# Patient Record
Sex: Female | Born: 1990 | Race: White | Hispanic: No | Marital: Married | Smoking: Former smoker
Health system: Southern US, Community
[De-identification: ages and names within clinical notes are randomized; demographics above are authoritative.]

## PROBLEM LIST (undated history)

## (undated) ENCOUNTER — Emergency Department (HOSPITAL_COMMUNITY)

## (undated) DIAGNOSIS — E079 Disorder of thyroid, unspecified: Secondary | ICD-10-CM

## (undated) DIAGNOSIS — U071 COVID-19: Secondary | ICD-10-CM

## (undated) DIAGNOSIS — E0500 Thyrotoxicosis with diffuse goiter without thyrotoxic crisis or storm: Secondary | ICD-10-CM

## (undated) DIAGNOSIS — Z9289 Personal history of other medical treatment: Secondary | ICD-10-CM

## (undated) DIAGNOSIS — Z803 Family history of malignant neoplasm of breast: Secondary | ICD-10-CM

## (undated) DIAGNOSIS — E282 Polycystic ovarian syndrome: Secondary | ICD-10-CM

## (undated) DIAGNOSIS — J302 Other seasonal allergic rhinitis: Secondary | ICD-10-CM

## (undated) HISTORY — DX: Family history of malignant neoplasm of breast: Z80.3

## (undated) HISTORY — DX: Polycystic ovarian syndrome: E28.2

## (undated) HISTORY — DX: COVID-19: U07.1

## (undated) HISTORY — DX: Thyrotoxicosis with diffuse goiter without thyrotoxic crisis or storm: E05.00

## (undated) HISTORY — DX: Personal history of other medical treatment: Z92.89

## (undated) HISTORY — DX: Other seasonal allergic rhinitis: J30.2

---
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
## 2010-10-31 ENCOUNTER — Ambulatory Visit (INDEPENDENT_AMBULATORY_CARE_PROVIDER_SITE_OTHER): Payer: BC Managed Care – PPO

## 2010-10-31 ENCOUNTER — Encounter (INDEPENDENT_AMBULATORY_CARE_PROVIDER_SITE_OTHER): Payer: Self-pay

## 2010-10-31 VITALS — BP 123/84 | HR 106 | Temp 99.5°F | Resp 18 | Wt 146.8 lb

## 2010-10-31 DIAGNOSIS — R109 Unspecified abdominal pain: Secondary | ICD-10-CM

## 2010-10-31 NOTE — Patient Instructions (Addendum)
Orders Placed This Encounter   . XR KUB   . Urine Pregnancy   . Urine RACK without Microscopy   . RETURN TO WORK/SCHOOL    Recommend over the counter Miralax per instructions on bottle.  ________________________________________________________________________  Short Term Disability and Family Medical Leave Act  [facility] does NOT provide assistance with any disability applications.  If you feel your medical condition requires you to be on disability, you will need to follow up with  Your primary care physician or a specialist.  We apologize for any inconvenience.    For Medication Prescribed by [facility]:  As an Urgent Care facility, our clinic does NOT offer prescription refills over the telephone.    If you need more of the medication one of our medical providers prescribed, you will  Either need to be re-evaluated by [place] or see your primary care physician.    ________________________________________________________________________      It is very important that we have a phone number that is the single best way to contact you in the event that we become aware of important clinical information or concerns after your discharge.  If the phone number you provided at registration is NOT this number you should inform staff and registration prior to leaving.      Your treatment and evaluation today was focused on identifying and treating potentially emergent conditions based on your presenting signs, symptoms, and history.  The resulting initial clinical impression and treatment plan is not intended to be definitive or a substitute for a full physical examination and evaluation by your primary care provider.  If your symptoms persist, worsen, or you develop any new or concerning symptoms, you need to be evaluated.       If you received x-rays during your visit, be aware that the final and formal interpretation of those films by a radiologist may occur after your discharge.  If there is a significant discrepancy identified after your discharge, we will contact you at th telephone number provided at registration.      If you received a pelvic exam, you may have cultures pending for sexually transmitted diseases.  Positive cultures are reported to the [facility] Department of Health as required by state law.  You should be contacted if you cultures are positive.  We will not contact you if they are negative.  You may contact the Health Information Management Office of [facility] to get a copy of your results.  You did NOT receive a PAP smear (the screening test for cervical).  This specific test for women is best performed by your gynecologist or primary care provider when indicated.      If you are over 66 year old, we cannot discuss your personal health information with a parent, spouse, family member, or anyone else without your express consent.  This does not include those who have legitimate access to your records and information to assist in your care under the provisions of HIPAA [facility] Portability and Accountability Act) law, or those to whom you have previously given express written consent to do so, such a legal guardian or Power of [place].      You may have received medication that may cause you to feel drowsy and/or light headed for several hours.  You may even experience some amnesia of your stay.  You should avoid operating a motor vehicle or performing any activity requiring complete alertness or coordination until you feel fully awake (approximately 24-48  hours).  Avoid alcoholic beverages.  You may also have a dry mouth for several hours.  This is a normal side effect and will disappear as the effects of the medication wear off.       Instructions discussed with patient upon discharge by clinical staff with all questions answered.  Please call [facility] [phone]) if any further questions.  Go immediately to the emergency department if any concern or worsening symptoms.        Barrett Henle, PA-C 10/31/2010, 2:32 PM    Abdominal Pain (Nonspecific)  Your exam might not show the exact reason you have abdominal pain. Since there are many different causes of abdominal pain, another checkup and more tests may be needed. It is very important to follow up for lasting (persistent) or worsening symptoms. A possible cause of abdominal pain in any person who still has his or her appendix is acute appendicitis. Appendicitis is often hard to diagnose. Normal blood tests, urine tests, ultrasound, and CT scans do not completely rule out early appendicitis or other causes of abdominal pain. Sometimes, only the changes that happen over time will allow appendicitis and other causes of abdominal pain to be determined. Other potential problems that may require surgery may also take time to become more apparent. Because of this, it is important that you follow all of the instructions below.  HOME CARE INSTRUCTIONS   Rest as much as possible.    Do not eat solid food until your pain is gone.    While adults or children have pain: A diet of water, weak decaffeinated tea, broth or bouillon, gelatin, oral rehydration solutions (ORS), frozen ice pops, or ice chips may be helpful.    When pain is gone in adults or children: Start a light diet (dry toast, crackers, applesauce, or white rice). Increase the diet slowly as long as it does not bother you. Eat no dairy products (including cheese and eggs) and no spicy, fatty, fried, or high-fiber foods.    Use no alcohol, caffeine, or cigarettes.    Take your regular medicines unless your caregiver told you not to.    Take any prescribed medicine as directed.     Only take over-the-counter or prescription medicines for pain, discomfort, or fever as directed by your caregiver. Do not give aspirin to children.   If your caregiver has given you a follow-up appointment, it is very important to keep that appointment. Not keeping the appointment could result in a permanent injury and/or lasting (chronic) pain and/or disability. If there is any problem keeping the appointment, you must call to reschedule.    SEEK IMMEDIATE MEDICAL CARE IF:   Your pain is not gone in 24 hours.    Your pain becomes worse, changes location, or feels different.    You or your child has an oral temperature above 101.1, not controlled by medicine.    Your baby is older than 3 months with a rectal temperature of 102 F (38.9 C) or higher.    Your baby is 31 months old or younger with a rectal temperature of 100.4 F (38 C) or higher.    You have shaking chills.    You keep throwing up (vomiting) or cannot drink liquids.    There is blood in your vomit or you see blood in your bowel movements.    Your bowel movements become dark or black.    You have frequent bowel movements.    Your bowel movements stop (  become blocked) or you cannot pass gas.    You have bloody, frequent, or painful urination.    You have yellow discoloration in the skin or whites of the eyes.    Your stomach becomes bloated or bigger.    You have dizziness or fainting.    You have chest or back pain.   MAKE SURE YOU:   Understand these instructions.    Will watch your condition.    Will get help right away if you are not doing well or get worse.   Document Released: 05/25/2005 Document Re-Released: 08/19/2009  [facility] Patient Information 2011 [place], [place].

## 2010-10-31 NOTE — Progress Notes (Signed)
History of Present Illness: [name] is a 20 y.o. female who presents to the Urgent Care today with chief complaint of Abdominal Cramping .  Started today as pressure/pain around belly button. States worse earlier and a little better now that she is up moving around.  States aggravated by stretching abdomin.  Better with leaning forward. States last 3 days had fever, vomiting, myalgias, h/a.  States all those symptoms resolved yesterday.  States decreased appetite.  No dysuria.  States feels like she needs to pass gas but unable. States last normal BM was 3 days ago. Rate pain at rest 4/10 and pain with stretching or touch 9/10.  No treatment tried.        I reviewed and confirmed the patient's past medical history taken by the nurse or medical assistant with the addition of the following:    Past Medical History:    History reviewed.  No pertinent past medical history.  Past Surgical History:    History reviewed.  No pertinent past surgical history.  Allergies:  No Known Allergies  Medications:    No current outpatient prescriptions on file.       Social History:    History   Substance Use Topics   . Smoking status: Never Smoker    . Smokeless tobacco: Never Used   . Alcohol Use: No       Family History: No significant family history.  No family history on file.  Review of Systems:  General: no fever and no fatigue  Gastrointestinal: no nausea, no vomiting, no diarrhea and abdominal pain  Genitourinary: no dysuria, no frequency and no flank pain  Musculoskeletal: no myalgias  Neurologic: no headache  All other review of systems were negative    Physical Exam:  Vital signs:   Filed Vitals:    10/31/10 1339   BP: 123/84   Pulse: 106   Temp: 37.5 C (99.5 F)   TempSrc: Tympanic   Resp: 18   Weight: 66.6 kg (146 lb 13.2 oz)   SpO2: 98%       General: Well appearing, No acute distress and able to move around on exam table without difficulty  ENT: MMM   Pulmonary: clear to auscultation bilaterally, no wheezes, no rales and no rhonchi  Cardiovascular: regular rate/rhythm and normal S1/S2  Gastrointestinal: Non-distended, normal bowel sounds, soft, no rebound, no guarding and generalized TTP epigastric region, no peritoneal signs, negative heel tap.  Genito-urinary: no CVAT  Psychiatric: appropriate affect and behavior  Neurologic: alert and oriented x 3    Data Reviewed  {ADD POCT TEST:   Point-of-care testing:   Time collected: 1405  Urine HCG: Negative  Leukocytes: Negative  Nitrite: Negative  Urobilinogen: 2mg /dL  Protein: Negative  pH: 5.5  Blood: Negative  Specific Gravity: 1.005  Ketones: Negative  Bilirubin: Negative  Glucose: Negative    and Radiography:  KUB - per clinic read and radiology - non-obstructive bowel gas pattern, mild amount of stool retention and colonic loops    Course: Condition at discharge: Good     Differential Diagnosis: constipation, irritable bowel, gall stones  Assessment: 1. Abdominal pain (789.00AP)        Plan:  Orders Placed This Encounter   . XR KUB   . Urine Pregnancy   . Urine RACK without Microscopy   Patient is currently in NAD.  Recommend trial of Miralax.  Present to Emergency Department with any concerning or emergent symptoms for further workup and emergent care.  Plan was discussed and patient/parent/guardian verbalized understanding.  If symptoms are worsening or not improving the patient should return to the Urgent Care for further evaluation.  Supervising physician was physically present in Urgent Care and available for consultation and did not participate in the care of this patient.   Barrett Henle, PA-C 10/31/2010, 2:28 PM

## 2011-02-25 ENCOUNTER — Ambulatory Visit (HOSPITAL_BASED_OUTPATIENT_CLINIC_OR_DEPARTMENT_OTHER): Payer: 59 | Admitting: Infectious Disease

## 2011-02-25 ENCOUNTER — Other Ambulatory Visit (INDEPENDENT_AMBULATORY_CARE_PROVIDER_SITE_OTHER): Payer: Self-pay | Admitting: Internal Medicine

## 2011-02-25 ENCOUNTER — Ambulatory Visit (INDEPENDENT_AMBULATORY_CARE_PROVIDER_SITE_OTHER): Payer: 59 | Admitting: Rheumatology

## 2011-02-25 ENCOUNTER — Ambulatory Visit
Admission: RE | Admit: 2011-02-25 | Discharge: 2011-02-25 | Disposition: A | Discharge status: Home / Self Care | Payer: 59 | Source: Ambulatory Visit | Attending: Internal Medicine | Admitting: Internal Medicine

## 2011-02-25 VITALS — BP 136/98 | HR 78 | Temp 97.1°F | Ht 68.0 in | Wt 152.0 lb

## 2011-02-25 DIAGNOSIS — R1032 Left lower quadrant pain: Secondary | ICD-10-CM | POA: Insufficient documentation

## 2011-02-25 LAB — BASIC METABOLIC PANEL
ANION GAP: 8 mmol/L (ref 5–16)
BUN/CREAT RATIO: 14 (ref 6–22)
CALCIUM: 9.7 mg/dL (ref 8.5–10.4)
CARBON DIOXIDE: 27 mmol/L (ref 22–32)
CHLORIDE: 104 mmol/L (ref 96–111)
CREATININE: 0.76 mg/dL (ref 0.49–1.10)
GLUCOSE,NONFAST: 93 mg/dL (ref 65–139)
POTASSIUM: 4.2 mmol/L (ref 3.5–5.1)
SODIUM: 139 mmol/L (ref 136–145)

## 2011-02-25 LAB — CBC/DIFF
BASOPHILS: 1 % (ref 0–1)
BASOS ABS: 0.05 THOU/uL (ref 0.0–0.2)
EOS ABS: 0.517 10*3/uL — ABNORMAL HIGH (ref 0.1–0.3)
EOSINOPHIL: 6 % (ref 1–6)
HCT: 41.7 % (ref 33.5–45.2)
HGB: 14.7 g/dL (ref 11.5–15.2)
LYMPHOCYTES: 36 % (ref 20–45)
LYMPHS ABS: 2.98 THOU/uL (ref 1.0–4.8)
MCH: 30.5 pg (ref 27.4–33.0)
MCHC: 35.2 g/dL (ref 31.6–35.5)
MCV: 86.6 fL (ref 82.0–99.0)
MONOCYTES: 8 % (ref 4–13)
MONOS ABS: 0.637 THOU/uL (ref 0.1–0.9)
MPV: 7.7 FL (ref 7.4–10.4)
NRBC'S: 0 /100{WBCs}
PLATELET COUNT: 244 THOU/uL (ref 140–450)
PMN ABS: 4.16 THOU/uL (ref 1.5–7.7)
PMN'S: 49 % (ref 40–75)
RBC: 4.81 MIL/uL (ref 3.84–5.04)
RDW: 11.5 % (ref 10.2–14.0)
WBC: 8.4 THOU/uL (ref 3.5–11.0)

## 2011-02-25 LAB — HEPATIC FUNCTION PANEL
ALBUMIN: 4.6 g/dL (ref 3.5–4.8)
ALKALINE PHOSPHATASE: 65 U/L (ref 38–126)
ALT (SGPT): 21 U/L (ref 7–45)
AST (SGOT): 23 U/L (ref 8–41)
BILIRUBIN, TOTAL: 1 mg/dL (ref 0.3–1.3)
BILIRUBIN,CONJUGATED: <0.2 mg/dl (ref 0.0–0.3)
TOTAL PROTEIN: 7 g/dL (ref 6.4–8.3)

## 2011-02-25 NOTE — H&P (Addendum)
 [name]  1991-01-13  [id number]  Chief complaint: establish care  History of present illness: This is a 20 year old white female without any health problems who has presented with a complaint of left lower abdominal pain, sharp in nature, non radiating without any associated symptoms, lasting for 1-2 hours for the last 5 months. Associates the pain with constipation, and states that the pain improves with bowel movements. Also complains of cramps before having a bowel movement which get better with the BM. Denies any N/V/D. Denies melena/blood in BM. Denies fevers.  States that she last had the pain on 5/25 which was before her period, and the pain got better with the period. But states that is because her constipation gets better when she has her period. Usually has one BM once every 2-3 days.  States that she normally has her period once every 4 months, lasting for 5-7 days with normal flow and occasional cramping. Denies vaginal discharge, hx of ovarian cysts, hx of STDs. Is married and is monogamous. Uses condoms for contraception, states that she was on trinessa which caused her to be moody so she stopped taking it. LMP was on 02/09/11.      Constitutional: negative for fevers, sweats, fatigue, malaise, anorexia and weight loss  Eyes: negative for visual disturbance, irritation and redness  Ears, nose, mouth, throat, and face: negative for hearing loss, ear drainage, earaches, nasal congestion, sore throat, hoarseness and voice change  Respiratory: negative for cough, pleurisy/chest pain, wheezing or dyspnea on exertion  Cardiovascular: negative for chest pain, chest pressure/discomfort, irregular heart beats, paroxysmal nocturnal dyspnea, exertional chest pressure/discomfort and tachypnea  Gastrointestinal: negative for dysphagia, odynophagia, dyspepsia, reflux symptoms, nausea, vomiting, change in bowel habits, melena and diarrhea  Genitourinary:negative for frequency, dysuria, urinary  incontinence, hesitancy and hematuria  Integument/breast: negative for skin lesion(s)  Hematologic/lymphatic: negative for lymphadenopathy and petechiae  Musculoskeletal:negative for myalgias, stiff joints, muscle weakness and bone pain  Neurological: negative for headaches, dizziness, memory problems, speech problems, coordination problems, gait problems and weakness  Behavioral/Psych: negative for depression and tobacco use  Endocrine: negative for diabetic symptoms including polyuria, polydipsia and polyphagia  Past medical history: No pertinent past medical history.  Past surgical history: No pertinent past surgical history.  Current medications:   No outpatient prescriptions have been marked as taking for the 02/25/11 encounter (Office Visit) with Leni Somerset, MD.   Family history: No family history of cancers, IBS. Positive for HTN  Social history:   History     Social History   . Marital Status: Married     Spouse Name: N/A     Number of Children: N/A   . Years of Education: N/A     Occupational History   . Not on file.     Social History Main Topics   . Smoking status: Never Smoker    . Smokeless tobacco: Never Used   . Alcohol Use: No   . Drug Use: Not on file   . Sexually Active: Not on file     Other Topics Concern   . Not on file     Social History Narrative   . No narrative on file   Allergies: Review of patient's allergies indicates no known allergies.  Examination:  Vitals: BP 136/98  Pulse 78  Temp(Src) 36.2 C (97.1 F) (Tympanic)  Ht 1.727 m (5' 8)  Wt 68.947 kg (152 lb)  BMI 23.11 kg/m2  SpO2 97%  Neck: No JVD or thyromegaly or  lymphadenopathy  Lungs: clear to auscultation bilaterally.   Cardiovascular:    Heart regular rate and rhythm, S1, S2 normal, no murmur, click, rub or gallop  Abdomen: soft, non-tender, bowel sounds normal, non-distended and no hepatosplenomegaly  Extremities: no cyanosis or edema  Pelvic exam: exam chaperoned by nurse, normal vagina and vulva, normal cervix without  lesions, polyps or tenderness, nulliparous os, pap smear done today. No adnexal masses felt  Labs:   No results found for any previous visit.  Assessment and plan:   This is a 20 year old white female who has come in to establish care.  1. LLQ pain: likely related to constipation which seems to be due to IBS  - likely differential of an ovarian cyst explained to the patient, and alerted her in response to symptoms resembling ovarian torsion  - asked her to improve her diet, to maintain adequate water  intake, and to exercise regularly  - prescribed docusate and bisacodyl, along with metamucil  - asked her to return with worsening of symptoms  - ordered hepatic panel and CBC to screen for possible additional disorders    2. Contraception:  - prescribed orthohydrocyclien lo, which has a lower amount of estrogen than trinessa  - patient opted to try out OCPs again before referral to a gynaecologist for further discussion of contraceptive methods    3. Healthcare maintenance:  - states that she had a HPV vaccine last year from [facility] but will confirm  - PAP smear done in clinic today  - CBC, BMP ordered    RTC in clinic in 6 months, or earlier as needed.   Antonia Paterson, MD 02/25/2011, 10:56 PM          I discussed the patient's care in detail with Dr.Paris Hohn prior to the patient leaving the clinic. Any significant discussion points are noted.    Jeffrey L. Erline, MD   Professor of Internal Medicine  Section of General Internal Medicine   [place]  [facility]

## 2011-02-26 ENCOUNTER — Encounter: Payer: Self-pay | Admitting: Infectious Disease

## 2011-02-27 LAB — HISTORICAL CYTOPATHOLOGY-GYN (PAP AND HPV TESTS)

## 2011-03-09 ENCOUNTER — Telehealth (INDEPENDENT_AMBULATORY_CARE_PROVIDER_SITE_OTHER): Payer: Self-pay | Admitting: Infectious Disease

## 2011-03-09 NOTE — Telephone Encounter (Signed)
 Was unable to reach her via phone, left voice mail and informed her of the PAP smear results. Asked her to call me with questions.    Antonia Paterson, MD 03/09/2011, 10:42 AM

## 2011-06-24 ENCOUNTER — Ambulatory Visit (INDEPENDENT_AMBULATORY_CARE_PROVIDER_SITE_OTHER): Payer: 59 | Admitting: Dermatology

## 2011-08-26 ENCOUNTER — Ambulatory Visit (INDEPENDENT_AMBULATORY_CARE_PROVIDER_SITE_OTHER): Payer: 59 | Admitting: Infectious Disease

## 2011-10-14 ENCOUNTER — Encounter (INDEPENDENT_AMBULATORY_CARE_PROVIDER_SITE_OTHER): Payer: 59 | Admitting: Infectious Disease

## 2011-10-19 ENCOUNTER — Ambulatory Visit (INDEPENDENT_AMBULATORY_CARE_PROVIDER_SITE_OTHER): Payer: 59 | Admitting: Dermatology

## 2013-04-01 ENCOUNTER — Other Ambulatory Visit: Payer: Self-pay

## 2013-04-03 ENCOUNTER — Other Ambulatory Visit: Payer: Self-pay

## 2013-05-03 ENCOUNTER — Encounter (INDEPENDENT_AMBULATORY_CARE_PROVIDER_SITE_OTHER): Payer: Self-pay | Admitting: Internal Medicine

## 2013-05-05 ENCOUNTER — Ambulatory Visit (INDEPENDENT_AMBULATORY_CARE_PROVIDER_SITE_OTHER): Payer: 59 | Admitting: DERMATOLOGY

## 2013-05-18 ENCOUNTER — Ambulatory Visit: Payer: 59 | Attending: Internal Medicine | Admitting: GENERAL

## 2013-05-18 ENCOUNTER — Encounter (INDEPENDENT_AMBULATORY_CARE_PROVIDER_SITE_OTHER): Payer: Self-pay | Admitting: GENERAL

## 2013-05-18 VITALS — BP 130/84 | HR 84 | Temp 97.5°F | Ht 67.0 in | Wt 185.4 lb

## 2013-05-18 DIAGNOSIS — Z23 Encounter for immunization: Secondary | ICD-10-CM | POA: Insufficient documentation

## 2013-05-18 DIAGNOSIS — Z0000 Encounter for general adult medical examination without abnormal findings: Secondary | ICD-10-CM | POA: Insufficient documentation

## 2013-05-18 NOTE — Progress Notes (Signed)
Influenza injection 0.5 ml given per order. Pt tolerated well with no complaints. Clinic supplied. See immunization chart.   TDAP given per order. Pt tolerated well with no complaints. Clinic Supplied. See immunization record.    Katie Panning Antionette Crespin Forstrom, LPN 16/03/9603, 4:30 PM

## 2013-05-18 NOTE — H&P (Addendum)
 Medical Group Practice  Operated by [facility]  New Patient History and Physical     Date:   05/18/2013  Name: [name]  Age: 22 y.o.    Chief Complaint: Patient is here to establish care.    History of Present Illness   22 y.o. female with no significant past medical history who presents to clinic for routine health care maintenance. She was last seen here by Dr. Leni on 02/2011. During that visit she reported abd pain and irregular menstruation. Today she reports resolution of both of these symptoms: menstruating Q62month lasting 4-5 days. Currently menstruating without cramping. Denies abnormal vaginal discharge. ROS completely negative except for mild recurrent tension headaches relived with ibuprofen . Patient report some family stress for which she takes Valerian and feels like it's helping.     Past Medical History: None    Past Surgical/Procedural History   Past Surgical History   Procedure Laterality Date   . Hx wisdom teeth extraction  2010       Medications   1. Valerian herbal medicine   2. Multivitamin     Allergies   1. Cat: sneeze, nasal itching   2. NKDA     Family History  1. HTN: mother at age 42     Social History  1. Denies ever smoking   2. Denies illicit drug use including IV drugs   3. Drinks approximately 5 beverages alcoholic every 2-3 weeks    4. Lives [place], [place] with husband. Feels safe at home. Husband and friends serve as support system   5. Sexually active with husband & in manganous relationship   6. Has some social stress attributed to family dynamics (passive aggressive with each other & thus tries to avoid her family)    Review of Systems   Constitutional: (-) fever, chills, night sweat, weight loss, fatigue, general weakness, dizziness, sick contacts, recent illness   HEENT: (-) rhinorrhea, cough, sore throat, epistaxis, acute visual disturbance, acute hearing changes   Respiratory: (-) cough, hemoptysis, chest pain, dyspnea     Cardiovascular: (-) chest  pain, palpitation, syncope    Gastrointestinal: (-) abd pain, N/V, jaundice, diarrhea, constipation, blood/mucus/pus in stool   Genitourinary: (-) frequency, urgency, dysuria, hematuria     Hematologic/lymphatic: (-) easy bruising/bleeding, paleness    Endocrine: (-) polyuria, polydipsia, temperature intolerance   Musculoskeletal: (-) muscle weakness, myalgias   Neurological: (+) headaches (-) seizures, focal weakness, paresthesias, memory loss       Physical Examination   BP 130/84  Pulse 84  Temp(Src) 36.4 C (97.5 F) (Tympanic)  Ht 1.702 m (5' 7)  Wt 84.1 kg (185 lb 6.5 oz)  BMI 29.03 kg/m2  SpO2 97%  LMP 05/18/2013  General: well appearing, overweight, no distress, vital signs reviewed and noted for mildly elevated BMI    HEENT: Head NCAT, B/L scleral anicterus, no conjunctival injection, PERRLA, EOMI, non-bulging pearly gray TM with nl appearing landmarks, midline nasal septum, intact gross hearing acuity, midline septum, sinus not TTP (frontal, maxillary), no polyps noted, MMM, no oropharyngeal injection or exudates, no tonsillar enlargement, midline uvula    Neck: supple, FROM, non-palpable thyroid , no LAD, no carotid bruit   Resp: normal WOB, CTAB, no r/r/w    CV: RRR, nlS1S2, no m/r/g    GI: +BS, soft, ND, NT, no rebound or guarding, no CVA tenderness, no palpable mass or organomegaly including HSM   Ext: dry, warm, well perfused, no c/c/e, 2+ pulses (UE & LE), nl CR (<  2sec)    Neuro: A&Ox3, intact CN3-12, no focal defecits appreciated [nl sensation, strength 5/5 symmetric & B/L, +2/4 reflexes B/L in UE & LE, no UMN signs (Babinski, Clonus)]   Skin: intact, no erythema, no rash/lesions noted      Data reviewed: Vitals, medications, allergies, immunizations, past medical notes, previous labs and imaging studies     Assessment and Plan   [name] is a 22 y.o. female with no significant past medical history who presents to clinic for routine follow-up care.       Routine health  maintenance    Administered influenza & Tdap vaccines today     PAP & HPV testing to be done next week (patient currently menstruating)    Fasting lipid & BMP ordered       Orders Placed This Encounter   . Influenza Vaccine 3 Years through Adult   . TETANUS,DIPTHERIA,ACEL PERT, > OR EQUAL TO 23YRS OLD(ADACEL )(ADMIN)   . Lipid Panel   . Basic Metabolic Pnl, Fasting         Lyanne Cadet, MD   05/18/2013 4:25 PM   Internal Medicine, PGY1   [place]  [facility]        I saw and examined the patient.  I reviewed the resident's note.  I agree with the findings and plan of care as documented in the resident's note.  Any exceptions/additions are edited/noted.    Patrisha Pons, MD 05/18/2013, 10:39 PM

## 2013-05-26 ENCOUNTER — Ambulatory Visit (INDEPENDENT_AMBULATORY_CARE_PROVIDER_SITE_OTHER): Payer: 59 | Admitting: Rheumatology

## 2013-05-26 ENCOUNTER — Encounter (INDEPENDENT_AMBULATORY_CARE_PROVIDER_SITE_OTHER): Payer: Self-pay | Admitting: GENERAL

## 2013-05-26 ENCOUNTER — Ambulatory Visit
Admission: RE | Admit: 2013-05-26 | Discharge: 2013-05-26 | Disposition: A | Discharge status: Home / Self Care | Payer: 59 | Source: Ambulatory Visit | Attending: Interna Medicine | Admitting: Interna Medicine

## 2013-05-26 ENCOUNTER — Ambulatory Visit (HOSPITAL_BASED_OUTPATIENT_CLINIC_OR_DEPARTMENT_OTHER): Payer: 59 | Admitting: GENERAL

## 2013-05-26 ENCOUNTER — Other Ambulatory Visit (INDEPENDENT_AMBULATORY_CARE_PROVIDER_SITE_OTHER): Payer: Self-pay | Admitting: Interna Medicine

## 2013-05-26 VITALS — BP 126/84 | HR 106 | Temp 98.7°F | Ht 67.0 in | Wt 184.1 lb

## 2013-05-26 DIAGNOSIS — Z0000 Encounter for general adult medical examination without abnormal findings: Secondary | ICD-10-CM | POA: Insufficient documentation

## 2013-05-26 DIAGNOSIS — Z124 Encounter for screening for malignant neoplasm of cervix: Secondary | ICD-10-CM | POA: Insufficient documentation

## 2013-05-26 LAB — BASIC METABOLIC PANEL, FASTING
ANION GAP: 10 mmol/L (ref 4–13)
BUN/CREAT RATIO: 20 (ref 6–22)
BUN: 14 mg/dL (ref 8–25)
CALCIUM: 9.5 mg/dL (ref 8.5–10.4)
CARBON DIOXIDE: 24 mmol/L (ref 22–32)
CHLORIDE: 104 mmol/L (ref 96–111)
CREATININE: 0.71 mg/dL (ref 0.49–1.10)
ESTIMATED GLOMERULAR FILTRATION RATE: >59 ml/min/1.73m2 (ref >59)
GLUCOSE, FASTING: 96 mg/dL (ref 70–105)
POTASSIUM: 4 mmol/L (ref 3.5–5.1)
SODIUM: 138 mmol/L (ref 136–145)

## 2013-05-26 LAB — LIPID PANEL
CHOLESTEROL: 196 mg/dL (ref 100–200)
HDL-CHOLESTEROL: 57 mg/dL (ref >39)
LDL (CALCULATED): 125 mg/dL — ABNORMAL HIGH (ref <100)
NON - HDL (CALCULATED): 139 mg/dL (ref <190)
TRIGLYCERIDES: 69 mg/dL (ref <150)
VLDL (CALCULATED): 14 mg/dL (ref <30)

## 2013-05-26 NOTE — Progress Notes (Addendum)
Medical Group Practice  Operated by [facility]  Return Patient Note          Date:   05/26/2013  Name: [name]  Age: 22 y.o.    Chief Complaint: Return for PAP smear     History of Present Illness   22 y.o. female with no significant past medical history who presents to clinic for routine health care maintenance. She was last seen here by me on 05/18/13 for her annual physical and to establish care. During that visit she was menstruating & thus pelvic exam and PAP smear were not performed, for which she has returned today.She reports no abd/suprapubic pain, abnormal vaginal odor or discharge.     Medications:     No outpatient prescriptions prior to visit.  No facility-administered medications prior to visit.    Physical Examination   BP 126/84   Pulse 106   Temp(Src) 37.1 C (98.7 F) (Tympanic)   Ht 1.702 m (5\' 7" )   Wt 83.5 kg (184 lb 1.4 oz)   BMI 28.82 kg/m2   SpO2 92%   LMP 05/18/2013  General: well appearing, overweight, no distress, vital signs reviewed and to be WNL     HEENT: NCAT, no conjunctival injection, MMM   Neck: no LAD  Resp: CTAB    CV: RRR, nlS1S2, no m/r/g    GI: +BS, soft, ND, NT   Genito-urinary: normal labia, normal introitus without lesions, normal urethral meatus without lesions or hypermobility, normal vaginal mucosa without discharge and normal cervix without lesions or prolapse, no cervical motion tenderness, no mass noted on bimanual exam   Ext: dry, warm, well perfused, no c/c/e    Neuro: grossly normal    Skin: intact, no erythema, no rash/lesions noted      Data reviewed: Vitals, medications, allergies, immunizations, past medical notes, previous labs and imaging studies     Assessment and Plan   [name] is a 22 y.o. female with no significant past medical history who presents to clinic for Pelvic exam with PAP smear.        Routine health maintenance    Patient asymptomatic & pelvic exam unremarkable    PAP & HPV testing ordered     Fasting lipid noted for elevated LDL: will encourage lifestyle modification      BMP WNL      Orders Placed This Encounter    PAP PROCEDURE ONLY-MEDICARE PTS (AMB ONLY)    CYTOPATHOLOGY-GYN (PAP TESTS ONLY)       Darrel Reach, MD   05/26/2013 6:36 PM   Internal Medicine, PGY1   [facility]    I discussed the patient's care with the Resident prior to the patient leaving the clinic. Any significant discussion points are noted .    Doree Albee, MD  Chief Resident  Department of Internal Medicine

## 2013-06-05 LAB — HISTORICAL CYTOPATHOLOGY-GYN (PAP AND HPV TESTS)

## 2013-06-08 NOTE — L&D Delivery Note (Addendum)
[facility]    Delivery Summary Note      Name: [name]   MRN: [id number]  DOB:  September 08, 1990  Admitted: 04/20/2014  6:12 PM       Pre-delivery diagnosis:      23 y.o. G1P0 at 2573w2d gestation. Term pregnancy, RH negative, Prolonged labor  Post-delivery diagnosis:  Same + delivery of a viable infant with compound presentation    Findings:           Delivery of viable term female infant, 5# 11 oz. compound presentation of left hand. Cord clamped x2 and cut. Infant to maternal abdomen.  Cord blood collected.  Placenta delivered via maternal effort, Schultze mechanism. Repair of vaginal laceration under local anesthesia using 3.0 Vicryl. Good hemostasis noted. Mom and baby "[name]" stable in the LDR. See below for delivery details.     Dr. Sedalia Mutaox was present and participated in the entire delivery.  MSIII Pete PeltLinda Nguyen also present.  Disposition:  Infant admitted to floor (rooming in with mother).    "Delivery Information"           Hollenberg, Girl [[id number]]     Events of Labor    Rupture date:  04/20/14   Rupture time:  2100   Rupture type:  Spontaneous   Fluid color:  Clear, Bloody                    Labor Length Times    Labor Onset Date:  04/20/14 Labor Onset Time:  0000   Dilation Complete Date:  04/20/14 Dilation Complete Time:  2130          Delivery Anesthesia    Method:  Local                    Assisted Delivery Details    Forceps attempted?:  No         Vacuum extractor attempted?:  No                            Shoulder Dystocia Maneuvers    Shoulder dystocial present?:  No             Delivery Information - Maternal    Vaginal laceration:  Yes Repaired:  Yes   Vaginal delivery est. blood loss (mL):  350   Surgical or additional est. blood loss (mL):  0   Combined est. blood loss (mL):  350   Repair suture:  3-0 Vicryl   Number of repair packets:  1          Delivery Information - Newborn    Birth date/time:   04/20/14 2143   Sex:  Female   Delivery type:  Vaginal, Spontaneous  Delivery             Newborn Presentation    Presentation:  Compound          Cord Information    Vessels:  3 Vessels   Complications:  Wrapped   Cord around:  left lower extremity   Number of loops:  1   Cord blood disposition:  Lab, Cord Segment Sent   Gases sent?:  No   Stem cell collection (by MD):  No          Delivery Resuscitation    Method:  None             Newborn  Assessment - All Apgars  Living status:  Yes      Skin color:     Heart rate:     Reflex Irrit:     Muscle tone:     Resp. effort:     Total:      1 Min:     0    2    2    1    2    7     5  Min:     1    2    2    2    2    9     10  Min:      15 Min:      20 Min:              Apgars assigned by:  CORWIN RN          Newborn Measurements    Weight:  2580 g           Placenta Information    Date/time:  04/20/2014 2153   Removal:  Spontaneous   Appearance:  Intact, Tomasa BlaseSchultz    Disposition:  Discarded              Delivery Providers    Delivering Clinician:  Sharin MonsHILL, ESTA   Other personnel:   Name Role   SHERRARD, Gerilyn PilgrimABBY ELIZABETH Registered Nurse   Agnes LawrenceORWIN, MICHELLE L Baby Nurse   COX, CHRISTINA LYN    Zenda AlpersGUYEN, LINDA                                    [name], CNM 04/20/2014, 22:59

## 2013-07-03 ENCOUNTER — Ambulatory Visit: Payer: 59 | Attending: DERMATOLOGY | Admitting: DERMATOLOGY

## 2013-07-03 ENCOUNTER — Encounter (INDEPENDENT_AMBULATORY_CARE_PROVIDER_SITE_OTHER): Payer: Self-pay | Admitting: DERMATOLOGY

## 2013-07-03 VITALS — BP 114/70 | Ht 66.93 in | Wt 180.8 lb

## 2013-07-03 DIAGNOSIS — B079 Viral wart, unspecified: Secondary | ICD-10-CM | POA: Insufficient documentation

## 2013-07-03 NOTE — Progress Notes (Signed)
Subjective:       Patient ID: [name] is a 23 y.o. female     Chief Complaint:     Chief Complaint   Patient presents with    Skin Check     Check lesion on left earlobe.        HPI  Growth left ear lobe for 4 years.  Has gotten bigger.  Wart right 3rd finger for years.  No pain or bleeding.  No personal or family hx skin cancer.  Careful in the sun    No current outpatient prescriptions on file.       Review of Systems   Constitutional: Negative for fever and chills.   Cardiovascular: Negative for chest pain.   Skin: Negative for itching and rash.       Objective:   . BP 114/70   Ht 1.7 m (5' 6.93")   Wt 82 kg (180 lb 12.4 oz)   BMI 28.37 kg/m2    Physical Exam   Constitutional: She is oriented to person, place, and time. She appears well-developed and well-nourished. No distress.   HENT:   Head: Normocephalic and atraumatic.   Neurological: She is alert and oriented to person, place, and time.   Skin: Skin is warm and dry. She is not diaphoretic.        Psychiatric: She has a normal mood and affect.     General skin exam was performed including head, neck, anterior/posterior trunk, bilateral upper and revealed no areas of concern other than those documented.    Assessment & Plan:       ICD-9-CM   1. Wart 078.10               Plan:  Discussed freezing    She wants to treat at home herself    Will follow in 4 months     The patient was educated on the importance of avoiding excessive sun exposure and wearing sunscreen daily.  Advised patient to re-apply sunscreen every 2-3 hours.  Advised the patient to avoid going to the tanning beds.  Advised to check skin routinely for any changes, especially any new moles or changes in existing moles.  Miles Costainoxann L Tanvir Hipple, MD

## 2013-08-17 ENCOUNTER — Encounter (INDEPENDENT_AMBULATORY_CARE_PROVIDER_SITE_OTHER): Payer: Self-pay | Admitting: Internal Medicine

## 2013-08-17 ENCOUNTER — Ambulatory Visit (INDEPENDENT_AMBULATORY_CARE_PROVIDER_SITE_OTHER): Payer: 59 | Admitting: Rheumatology

## 2013-08-17 ENCOUNTER — Ambulatory Visit (INDEPENDENT_AMBULATORY_CARE_PROVIDER_SITE_OTHER): Payer: 59 | Admitting: Internal Medicine

## 2013-08-17 ENCOUNTER — Ambulatory Visit
Admission: RE | Admit: 2013-08-17 | Discharge: 2013-08-17 | Disposition: A | Discharge status: Home / Self Care | Payer: 59 | Source: Ambulatory Visit | Attending: Internal Medicine | Admitting: Internal Medicine

## 2013-08-17 DIAGNOSIS — Z331 Pregnant state, incidental: Secondary | ICD-10-CM | POA: Insufficient documentation

## 2013-08-17 DIAGNOSIS — Z3480 Encounter for supervision of other normal pregnancy, unspecified trimester: Secondary | ICD-10-CM

## 2013-08-17 LAB — URINALYSIS WITH MICROSCOPIC REFLEX IF INDICATED
BILIRUBIN: NEGATIVE
BLOOD: NEGATIVE
GLUCOSE: NEGATIVE mg/dL
LEUKOCYTES: NEGATIVE
NITRITE: NEGATIVE
PH URINE: 6 (ref 5.0–8.0)
PROTEIN: NEGATIVE mg/dL
UROBILINOGEN: NORMAL mg/dL

## 2013-08-17 LAB — URINALYSIS MACROSCOPIC WITH REFLEX TO MICROSCOPIC URINALYSIS (CULTURE NOT PERFORMED): SPECIFIC GRAVITY, URINE: 1.004 — ABNORMAL LOW (ref 1.005–1.030)

## 2013-08-17 LAB — CBC
HCT: 40.3 % (ref 33.5–45.2)
HGB: 13.6 g/dL (ref 11.2–15.2)
MCH: 29.7 pg (ref 27.4–33.0)
MCHC: 33.7 g/dL (ref 32.5–35.8)
MCV: 88.2 fL (ref 78–100)
MPV: 9 fL (ref 7.5–11.5)
PLATELET COUNT: 252 THOU/uL (ref 140–450)
RBC: 4.57 MIL/uL (ref 3.63–4.92)
RDW: 13.4 % (ref 12.0–15.0)
WBC: 9.4 10*3/uL (ref 3.5–11.0)

## 2013-08-17 LAB — URINALYSIS, MICROSCOPIC
RBC'S: 1 /HPF (ref 0–4)
WBC'S: 1 /HPF (ref 0–6)

## 2013-08-17 LAB — ABO/RH AND ANTIBODY SCREEN
ABO/RH(D): A NEG
ANTIBODY SCREEN: NEGATIVE

## 2013-08-17 LAB — HEPATITIS B CORE ANTIBODY: AB TO HEP. B CORE AG: NEGATIVE

## 2013-08-17 LAB — HCG, SERUM QUALITATIVE, PREGNANCY: PREGNANCY, SERUM QUALITATIVE: POSITIVE — AB

## 2013-08-17 LAB — HIV1/HIV2 SCREEN, COMBINED ANTIGEN AND ANTIBODY: HIV SCREEN, COMBINED ANTIGEN & ANTIBODY: NEGATIVE

## 2013-08-17 LAB — HCG, PLASMA OR SERUM QUANTITATIVE, PREGNANCY: HCG QUANTITATIVE/PREG: 2247 m[IU]/mL — ABNORMAL HIGH (ref <5)

## 2013-08-17 MED ORDER — PRENATAL VITS NO.19-IRON 22 MG,6 MG-FOLIC ACID 1 MG-DHA 200 MG CAPSULE
1.0000 | ORAL_CAPSULE | Freq: Every day | ORAL | Status: DC
Start: 2013-08-17 — End: 2023-12-08

## 2013-08-17 NOTE — Progress Notes (Signed)
08/17/13 1400   HCG   Urine HCG Positive   Rapid HCG Lot# [id number]   Expiration Date 01/06/14   Internal Control Valid yes   Initials RKM

## 2013-08-17 NOTE — Progress Notes (Addendum)
Medical Group Practice  Operated by [facility]  Return Patient Note     Date:   08/17/2013  Name: [name]  Age: 23 y.o.    Chief Complaint: Missed Menses    Subjective:  [name] is a 23 y.o. female who presents after having two positive urine pregnancy tests at home.  LMP 06/22/13.  Denies any nausea, vomiting, vaginal bleeding.      Current Outpatient Prescriptions   Medication Sig    PNV Combo #19-Iron-Fol Ac-DHA 22-6-1-200 mg Oral Capsule Take 1 Cap by mouth Once a day       Objective:  BP 132/78   Pulse 93   Temp(Src) 36.2 C (97.1 F) (Tympanic)   Ht 1.694 m (5' 6.69")   Wt 83.8 kg (184 lb 11.9 oz)   BMI 29.2 kg/m2   SpO2 98%  General:  Healthy appearing female in no acute distress.  Vital signs reviewed.    Eyes: Conjunctiva clear.  Pupils equal, round, reactive.  HENT:  Normocephalic, atraumatic.  Mucous membranes moist.    Lungs: Clear bilaterally.  No wheezes, rales, or rhonchi noted.    Cardiovascular:  Regular rate and rhythm.  No murmurs, rubs, or gallops noted.    Abdomen:  Soft, non-tender, non-distended.  Bowel sounds normal.  Extremities: No cyanosis or edema.      Neurologic:  Alert, oriented x 3  Psychiatric:  Behavior and affect normal.      Data reviewed:  POCT urine preg test positive.    Assessment and Plan  1. Pregnancy        Orders Placed This Encounter    URINE CULTURE,ROUTINE    Cbc    RUBELLA VIRUS IGG AB    VARICELLA-ZOSTER VIRUS (VZV) ANTIBODY, IGG AND IGM, SERUM    URINALYSIS WITH CULTURE REFLEX    URINALYSIS, MICROSCOPIC    Hepatitis B Core Antibody    Serum Syphilis Test    NEISSERIA GONORRHOEAE DNA BY PCR    CHLAMYDIA TRACHOMITIS DNA BY PCR (INHOUSE)    HIV1/HIV2 SCREEN, COMBINED ANTIGEN AND ANTIBODY    HCG, SERUM QUALITATIVE, PREGNANCY    Referral to Ob/Gyn    POCT URINE HCG    Abo/Rh And Antibody Screen    PNV Combo #19-Iron-Fol Ac-DHA 22-6-1-200 mg Oral Capsule     23 y.o. female who presents after positive home  pregnancy test.  POCT pregnancy test in clinic positive as well.    -Start Prenatal vitamins  -Referral to OB  -Prenatal labs ordered  -Advised to avoid tobacco, alcohol    Follow up:  RTC PRN or as scheduled with Dr. Lake BellsJacques.    Janit BernJustin T. Deron Poole, M.D.  08/17/2013 14:17  PGY-3  Department of Internal Medicine  [facility] [employer]  Pager # [phone]        I discussed the patient's care with the Resident prior to the patient leaving the clinic. Any significant discussion points are noted .    Crist FatSarah Sofka, MD 08/17/2013, 15:45

## 2013-08-18 LAB — NEISSERIA GONORRHOEAE DNA BY PCR: NEISSERIA GONORRHOEAE: NEGATIVE

## 2013-08-18 LAB — VARICELLA-ZOSTER VIRUS (VZV) ANTIBODY, IGG AND IGM, SERUM
VARICELLA IGG AB INDEX: 5 Index
VARICELLA-ZOSTER VIRUS (VZV) ANTIBODY, IGG, SERUM: POSITIVE
VARICELLA-ZOSTER VIRUS (VZV) ANTIBODY, IGM, SERUM: NEGATIVE

## 2013-08-18 LAB — URINE CULTURE,ROUTINE

## 2013-08-18 LAB — SERUM SYPHILIS TEST: RPR: NONREACTIVE

## 2013-08-18 LAB — RUBELLA VIRUS IGG AB: RUBELLA IGG: 42.5 IU/mL

## 2013-08-18 LAB — CHLAMYDIA TRACHOMITIS DNA BY PCR (INHOUSE): CHLAMYDIA TRACHOMATIS: NEGATIVE

## 2013-08-21 ENCOUNTER — Ambulatory Visit (INDEPENDENT_AMBULATORY_CARE_PROVIDER_SITE_OTHER): Payer: Self-pay | Admitting: Advanced Practice Midwife

## 2013-08-21 ENCOUNTER — Telehealth (INDEPENDENT_AMBULATORY_CARE_PROVIDER_SITE_OTHER): Payer: Self-pay | Admitting: GENERAL

## 2013-08-21 NOTE — Telephone Encounter (Signed)
Message copied by Zada FindersVICTOR, Beautifull Cisar on Mon Aug 21, 2013 11:14 AM  ------       Message from: Bernerd PhoLISTON, LISA       Created: Mon Aug 21, 2013 11:13 AM         >> Holly BodilySHIRLEY BAKER 08/21/2013 10:37 AM       Tona SensingLena Cerbone nob pt          The patient's husband called asking if his wife could be seen for her nob visit before April 17th.   He is requesting that his wife be seen before her first trimester ends.   The patient has [employer] insurance.   [facility] [phone].   Thank you  ------

## 2013-08-21 NOTE — Telephone Encounter (Signed)
Pharmacy is asking that they dispense the prenatal vitamin that they have in stock is this okay?  Katie GinsbergNathea J Vallory Oetken, MA  08/21/2013, 13:07

## 2013-08-21 NOTE — Telephone Encounter (Signed)
No sooner appt. Left message for husband to return call.  Bernerd PhoLisa Liston, psr

## 2013-08-22 ENCOUNTER — Ambulatory Visit (INDEPENDENT_AMBULATORY_CARE_PROVIDER_SITE_OTHER): Payer: Self-pay | Admitting: GENERAL

## 2013-08-22 ENCOUNTER — Encounter (INDEPENDENT_AMBULATORY_CARE_PROVIDER_SITE_OTHER): Payer: Self-pay | Admitting: GENERAL

## 2013-08-22 NOTE — Telephone Encounter (Signed)
Message copied by Rhys MartiniPOILLUCCI, Philamena Kramar N on Tue Aug 22, 2013 12:09 PM  ------       Message from: Marcille BlancoBERRY, EDWARD R       Created: Tue Aug 22, 2013 11:30 AM         >> Marcille BlancoDWARD R BERRY 08/22/2013 11:30 AM       Hunsucker, Yvonne KendallJustin Todd, MD              Pharmacy called stated that had some question about this medication and is wanting to switch to a generic kind please call to advise  ------

## 2013-08-22 NOTE — Telephone Encounter (Signed)
Question was regarding prenatal vitamin, and need to know if a generic would be ok.  Please advise and I will call the pharmacy back.  Simon RheinAndrea N Tyrihanna Wingert, RN  08/22/2013, 12:11

## 2013-08-23 NOTE — Telephone Encounter (Signed)
Patient called wanting advice on the brand/type of pre-natal vitamin she should buy.     Patient may choice any prenatal vitamin brand but should generally look for:   Folic acid 400-800 mcg   Calcium 250 mg  Iron 30 mg   Vitamin C 50 mg   Vitamin B6 2 mg   Vitamin D 400 Units   Copper 3 mg   Zinc 15 mg     Darrel ReachLina Rieley Hausman, MD   08/23/2013 14:41   Internal Medicine, PGY1   [facility]

## 2013-08-23 NOTE — Telephone Encounter (Signed)
Message copied by Darrel ReachJACQUES, Gracia Saggese on Wed Aug 23, 2013  2:35 PM  ------       Message from: April HoldingBARNHART, MARGIE       Created: Mon Aug 21, 2013  9:51 AM       Regarding: FW: question about pnv         >> NANCY CLINE 08/21/2013 09:47 AM       Dr Judeth HornHunsucker:     [facility] to discuss if you have a specific kind of pre-natal vit. You want the pt to have. Thank you   ------

## 2013-08-24 NOTE — Telephone Encounter (Signed)
Message copied by Darrel ReachJACQUES, Linda Biehn on Thu Aug 24, 2013  4:52 PM  ------       Message from: [name], [name]       Created: Tue Aug 22, 2013 10:15 AM       Regarding: FW: Visit Follow-Up Question       Contact: [phone]         [name],        Typically they don't get you in until you're like [redacted] weeks pregnant       Jess              ----- Message -----          From: [name]          Sent: 08/22/2013   9:24 AM            To: [facility]       Subject: Visit Follow-Up Question                                   Hello,       I recently found out I am about [redacted] weeks pregnant (yay!) and I came in and saw Dr. Judeth HornHunsucker for my confirmatory blood tests, but they cannot schedule me for my obgyn appointment until April 17th and I am worried because my first trimester will be almost over by then. I was wondering if there was anything you could do as my primary provider? Could you do the next appointment sooner than then or anything? I really want to do this first appointment way sooner than April 17th but they said there is nothing available. Please let me know if you could help me.       -[name]         ------

## 2013-08-24 NOTE — Telephone Encounter (Signed)
Any prenatal vitamin they want to give is fine. Thanks. ----- Message ----- From: Boykin NearingJustin Todd Hunsucker, MD  Sent: 08/22/2013 12:11 PM To: Rhys MartiniPoillucci, Nylen Creque N, RN     Called pharmacy and notified of message from provider above.  Simon RheinAndrea N Sanjeev Main, RN  08/24/2013, 11:26

## 2013-09-08 ENCOUNTER — Encounter (INDEPENDENT_AMBULATORY_CARE_PROVIDER_SITE_OTHER): Payer: Self-pay | Admitting: GENERAL

## 2013-09-20 ENCOUNTER — Other Ambulatory Visit (INDEPENDENT_AMBULATORY_CARE_PROVIDER_SITE_OTHER): Payer: Self-pay | Admitting: Maternal & Fetal Medicine

## 2013-09-22 ENCOUNTER — Ambulatory Visit
Admission: RE | Admit: 2013-09-22 | Discharge: 2013-09-22 | Disposition: A | Discharge status: Home / Self Care | Payer: 59 | Source: Ambulatory Visit | Attending: Advanced Practice Midwife | Admitting: Advanced Practice Midwife

## 2013-09-22 ENCOUNTER — Ambulatory Visit (HOSPITAL_BASED_OUTPATIENT_CLINIC_OR_DEPARTMENT_OTHER): Payer: 59

## 2013-09-22 ENCOUNTER — Encounter (INDEPENDENT_AMBULATORY_CARE_PROVIDER_SITE_OTHER): Payer: Self-pay | Admitting: Advanced Practice Midwife

## 2013-09-22 ENCOUNTER — Encounter (INDEPENDENT_AMBULATORY_CARE_PROVIDER_SITE_OTHER): Payer: 59 | Admitting: Advanced Practice Midwife

## 2013-09-22 ENCOUNTER — Ambulatory Visit (INDEPENDENT_AMBULATORY_CARE_PROVIDER_SITE_OTHER): Payer: 59 | Admitting: Advanced Practice Midwife

## 2013-09-22 DIAGNOSIS — Z3400 Encounter for supervision of normal first pregnancy, unspecified trimester: Secondary | ICD-10-CM

## 2013-09-22 DIAGNOSIS — Z36 Encounter for antenatal screening of mother: Secondary | ICD-10-CM

## 2013-09-22 LAB — DRUG SCREEN, WITH CONFIRMATION, URINE
BARBITURATE, URINE: NEGATIVE
BENZODIAZEPINE,URINE: NEGATIVE
BUPRENORPHINE, URINE QUALITATIVE: NEGATIVE
CANNABINOID (THC), URINE: NEGATIVE
METHADONE, URINE: NEGATIVE
OXYCODONE QUAL, URINE: NEGATIVE
RANDOM URINE CREATININE: 108 mg/dL (ref 20–200)

## 2013-09-22 MED ORDER — PRENATAL VITAMINS-IRON FUMARATE 27 MG IRON-FOLIC ACID 0.8 MG TABLET
1.0000 | ORAL_TABLET | Freq: Every day | ORAL | Status: DC
Start: 2013-09-22 — End: 2013-10-18

## 2013-09-22 NOTE — Progress Notes (Addendum)
Here with husband John for NOB visit.  He works here in Building services engineercytology, she works at [employer].  Requests Rx for PNVs -- done  Wants genetic testing -- will schedule appropriately    Planned pregnancy dated by early u/s per very unsure LMP.  G1 P0  Feeling tired and nauseated but no vomiting.  Questions regarding exercise, how many hours she can work, course of prenatal care.  All answered in full.    NOB History taken as charted below:  Past Medical History   Diagnosis Date   . Seasonal allergic reaction      occasional OTC meds      Past Surgical History   Procedure Laterality Date   . Hx wisdom teeth extraction  2010      Family History   Problem Relation Age of Onset   . Hypertension Mother    . Spont Ab Mother    . Eclampsia Sister      ?eclampsia -- BP issue, on bedrest   . Spont Ab Sister    . Spont Ab Sister         Other than ROS in the HPI, all other systems were negative.     Prenatal physical done as charted below:  OB Prenatal Exam: Filed by Tona Sensingerbone, Colsen Modi, CNM on 09/22/2013  5:48 PM    General Physical Exam    HEENT: Normal  Heart: Normal  Skin: Normal     Thyroid: Normal  Lungs: Normal  Extremities: Normal 1+/1+ DTRs, no clonus    Lymph Nodes: Normal  Breasts: Normal  Neurological: Normal     Abdomen: Normal Neg CVAT           Pelvic Exam    Vulva: Normal  Vagina: Normal     Cervix: Normal  Uterus: 11 Weeks     Adnexa: Normal  Rectum:   deferred    Spines: Average  Sacrum: Concave     Subpubic Arch: Normal  Diagonal Conjugate:   >12 cm    Pelvic Type: Gynecoid                  FHTs not attempted with Doppler per seen on u/s earlier this morning    Assessment:      IUP at 10 weeks 2 days       S=D      Considering Midwifery Care.     Desires First trimester screen.     Some blood work ordered and done by PCP -- reviewed, WNL    Plan:    NOB teaching done.      OB Warning s/s taught and how to call reviewed.     Pap not due     CT/GC negative 08/2013     Additional prenatal labs and CF testing to be done with  first trimester screen.    [employer] [facility] overview given with "goody bag" and handouts     RTO 4 weeks ROB, sooner prn.    CNM saw patient independently.  Physician available on-site for consultation, collaboration, or referral as per CNM certification and licensure.    Tona SensingLena Joanthony Hamza, CNM  Jola Babinskiharles Hochberg, MD  09/25/2013, 09:36

## 2013-09-23 LAB — URINE CULTURE: CULTURE OBSERVATION: 50000

## 2013-09-25 ENCOUNTER — Encounter (INDEPENDENT_AMBULATORY_CARE_PROVIDER_SITE_OTHER): Payer: 59 | Admitting: Obstetrics & Gynecology

## 2013-09-30 ENCOUNTER — Encounter (HOSPITAL_BASED_OUTPATIENT_CLINIC_OR_DEPARTMENT_OTHER): Payer: Self-pay | Admitting: Advanced Practice Midwife

## 2013-10-05 ENCOUNTER — Other Ambulatory Visit (INDEPENDENT_AMBULATORY_CARE_PROVIDER_SITE_OTHER): Payer: 59

## 2013-10-09 ENCOUNTER — Encounter (INDEPENDENT_AMBULATORY_CARE_PROVIDER_SITE_OTHER): Payer: Self-pay

## 2013-10-09 ENCOUNTER — Ambulatory Visit
Admission: RE | Admit: 2013-10-09 | Discharge: 2013-10-09 | Disposition: A | Discharge status: Home / Self Care | Payer: 59 | Source: Ambulatory Visit | Attending: Advanced Practice Midwife | Admitting: Advanced Practice Midwife

## 2013-10-09 ENCOUNTER — Ambulatory Visit (INDEPENDENT_AMBULATORY_CARE_PROVIDER_SITE_OTHER): Payer: Self-pay | Admitting: Advanced Practice Midwife

## 2013-10-09 ENCOUNTER — Telehealth (INDEPENDENT_AMBULATORY_CARE_PROVIDER_SITE_OTHER): Payer: Self-pay | Admitting: Advanced Practice Midwife

## 2013-10-09 ENCOUNTER — Encounter (INDEPENDENT_AMBULATORY_CARE_PROVIDER_SITE_OTHER): Payer: Self-pay | Admitting: Advanced Practice Midwife

## 2013-10-09 DIAGNOSIS — Z331 Pregnant state, incidental: Secondary | ICD-10-CM | POA: Insufficient documentation

## 2013-10-09 DIAGNOSIS — Z6791 Unspecified blood type, Rh negative: Principal | ICD-10-CM

## 2013-10-09 DIAGNOSIS — O26899 Other specified pregnancy related conditions, unspecified trimester: Secondary | ICD-10-CM

## 2013-10-09 LAB — HEPATITIS B SURFACE ANTIGEN: HEPATITIS B SURFACE AG: NEGATIVE

## 2013-10-09 MED ORDER — RHO(D) IMMUNE GLOBULIN 1,500 UNIT (300 MCG) INTRAMUSCULAR SYRINGE
300.0000 ug | INJECTION | Freq: Once | INTRAMUSCULAR | Status: AC
Start: 2013-10-09 — End: 2013-10-09

## 2013-10-09 NOTE — Telephone Encounter (Signed)
Pt presented to clinic stating that she fell.  She fell on her knee & did not hit her abdomen or her back.  She has no c/o pain, bleeding or leakage of fluid.  Pt does not want to go to the ED because of co-pay.  Informed that she can go to Urgent Care but I don't know if they have a fetal doppler to listen to fhr.  Pt is Aneg & discussed with Betsy.  She would like for the pt to have rhogam.  Called & left message for the pt to return call to notify of need for rhogam injection.

## 2013-10-09 NOTE — Telephone Encounter (Signed)
Contacted pt & she is agreeable to come tomorrow for labs & rhogam.

## 2013-10-09 NOTE — Telephone Encounter (Signed)
Please give rhogam 300 mcg IM per Kristine RoyalBetsy Miller, CNM

## 2013-10-10 ENCOUNTER — Ambulatory Visit (HOSPITAL_BASED_OUTPATIENT_CLINIC_OR_DEPARTMENT_OTHER): Payer: 59

## 2013-10-10 ENCOUNTER — Ambulatory Visit
Admission: RE | Admit: 2013-10-10 | Discharge: 2013-10-10 | Disposition: A | Discharge status: Home / Self Care | Payer: 59 | Source: Ambulatory Visit | Attending: Advanced Practice Midwife | Admitting: Advanced Practice Midwife

## 2013-10-10 DIAGNOSIS — O26899 Other specified pregnancy related conditions, unspecified trimester: Secondary | ICD-10-CM

## 2013-10-10 DIAGNOSIS — O360990 Maternal care for other rhesus isoimmunization, unspecified trimester, not applicable or unspecified: Secondary | ICD-10-CM | POA: Insufficient documentation

## 2013-10-10 DIAGNOSIS — Z6791 Unspecified blood type, Rh negative: Secondary | ICD-10-CM

## 2013-10-10 DIAGNOSIS — Z3400 Encounter for supervision of normal first pregnancy, unspecified trimester: Secondary | ICD-10-CM

## 2013-10-10 LAB — TEST FOR FETAL CELLS: FETALDEX (KLEIHAUER BETKE STAIN): NONE SEEN

## 2013-10-10 LAB — ANTIBODY SCREEN: ANTIBODY SCREEN: NEGATIVE

## 2013-10-10 NOTE — Progress Notes (Signed)
Administered RhoGAM into PTs left deltoid.

## 2013-10-11 ENCOUNTER — Encounter (INDEPENDENT_AMBULATORY_CARE_PROVIDER_SITE_OTHER): Payer: Self-pay | Admitting: Advanced Practice Midwife

## 2013-10-18 ENCOUNTER — Ambulatory Visit: Payer: 59 | Attending: Advanced Practice Midwife | Admitting: Advanced Practice Midwife

## 2013-10-18 DIAGNOSIS — O26899 Other specified pregnancy related conditions, unspecified trimester: Secondary | ICD-10-CM

## 2013-10-18 DIAGNOSIS — O360990 Maternal care for other rhesus isoimmunization, unspecified trimester, not applicable or unspecified: Secondary | ICD-10-CM | POA: Insufficient documentation

## 2013-10-18 DIAGNOSIS — Z6791 Unspecified blood type, Rh negative: Secondary | ICD-10-CM

## 2013-10-18 DIAGNOSIS — Z3400 Encounter for supervision of normal first pregnancy, unspecified trimester: Secondary | ICD-10-CM | POA: Insufficient documentation

## 2013-10-28 DIAGNOSIS — O26899 Other specified pregnancy related conditions, unspecified trimester: Secondary | ICD-10-CM | POA: Insufficient documentation

## 2013-10-28 DIAGNOSIS — Z6791 Unspecified blood type, Rh negative: Secondary | ICD-10-CM | POA: Insufficient documentation

## 2013-10-28 NOTE — Progress Notes (Addendum)
Chief Complaint   Patient presents with    Routine Prenatal Visit      Pt presents for routine ROB visit   No c/o   Feeling better than last month   One episode of spotting on 5/5; short lived; small amount; did come in for RHogam   Excited to hear FHTs today     Anatomy scan ordered       Obstetric History    G1   P0   T0   P0   A0   TAB0   SAB0   E0   M0   L0        Gestational age: [redacted]w[redacted]d    Weight: 82.6 kg (182 lb 1.6 oz)  BP (Non-Invasive): 88/60 mmHg  # of fetuses: 1  FHR (1): 150's     Urine negative for all      ICD-9-CM    1. Pregnant V22.2 POCT URINE DIPSTICK     US FETAL ASSESSMENT   2. Supervision of normal first pregnancy V22.0 US FETAL ASSESSMENT       Disposition: Return in about 4 weeks (around 11/15/2013) for rob and fetal US at [facility].    Seen independently with collaborating physician present in the clinic  Kristine Royal, CNM 10/28/2013, 13:21   Jola Babinski, MD  10/31/2013, 09:30

## 2013-11-07 ENCOUNTER — Encounter (INDEPENDENT_AMBULATORY_CARE_PROVIDER_SITE_OTHER): Payer: 59 | Admitting: DERMATOLOGY

## 2013-11-15 ENCOUNTER — Ambulatory Visit (HOSPITAL_BASED_OUTPATIENT_CLINIC_OR_DEPARTMENT_OTHER): Payer: 59 | Admitting: Advanced Practice Midwife

## 2013-11-15 ENCOUNTER — Ambulatory Visit
Admission: RE | Admit: 2013-11-15 | Discharge: 2013-11-15 | Disposition: A | Discharge status: Home / Self Care | Payer: 59 | Source: Ambulatory Visit | Attending: Advanced Practice Midwife | Admitting: Advanced Practice Midwife

## 2013-11-15 VITALS — BP 110/68 | Wt 185.6 lb

## 2013-11-15 DIAGNOSIS — Z3400 Encounter for supervision of normal first pregnancy, unspecified trimester: Secondary | ICD-10-CM

## 2013-11-15 DIAGNOSIS — Z363 Encounter for antenatal screening for malformations: Secondary | ICD-10-CM | POA: Insufficient documentation

## 2013-11-15 DIAGNOSIS — Z1389 Encounter for screening for other disorder: Secondary | ICD-10-CM | POA: Insufficient documentation

## 2013-11-15 DIAGNOSIS — O360990 Maternal care for other rhesus isoimmunization, unspecified trimester, not applicable or unspecified: Secondary | ICD-10-CM | POA: Insufficient documentation

## 2013-11-15 DIAGNOSIS — Z36 Encounter for antenatal screening of mother: Secondary | ICD-10-CM

## 2013-11-15 NOTE — Progress Notes (Addendum)
Chief Complaint   Patient presents with    Routine Prenatal Visit      Pt presents for routine ROB visit   Feeling better, though still some nausea and food aversion     Anatomy scan today; WNL; girl   Offered AFP only: declined     Trouble with leg/foot pain after long day of work at [employer]; advised compression     stockings and supportive shoes     Obstetric History    G1   P0   T0   P0   A0   TAB0   SAB0   E0   M0   L0        Gestational age: [redacted]w[redacted]d    Weight: 84.2 kg (185 lb 10 oz)  BP (Non-Invasive): 110/68 mmHg  # of fetuses: 1  Fundal height: [place]  Preterm Labor: None  Fetal Movement: Present  Presentation: Unable To Assess  Edema: Trace  FHR (1): 130's  Comments: [place] today     Urine negative for all      ICD-9-CM    1. Supervision of normal first pregnancy V22.0 POCT URINE DIPSTICK    Routine labs:    Rh positive      Rubella immune   First trimester screen:  Declined   Flu vaccine:    December 1014   Anatomy scan:   WNL   28 week labs:     Pertussis vaccine:    GBS culture:      2.   Rh negative   Received Rhogam 10/10/13 due to spotting           Disposition: Return in about 4 weeks (around 12/13/2013) for rob.    Seen independently with collaborating physician present in the clinic  Kristine Royal, CNM 11/15/2013, 23:40       Heron Nay, MD  11/17/2013, 09:25

## 2013-12-19 ENCOUNTER — Encounter (HOSPITAL_BASED_OUTPATIENT_CLINIC_OR_DEPARTMENT_OTHER): Payer: Self-pay | Admitting: Advanced Practice Midwife

## 2013-12-19 ENCOUNTER — Ambulatory Visit: Payer: 59 | Attending: Advanced Practice Midwife | Admitting: Advanced Practice Midwife

## 2013-12-19 VITALS — BP 118/66 | Ht 66.0 in | Wt 193.1 lb

## 2013-12-19 DIAGNOSIS — Z3400 Encounter for supervision of normal first pregnancy, unspecified trimester: Secondary | ICD-10-CM | POA: Insufficient documentation

## 2013-12-19 NOTE — Progress Notes (Addendum)
Patient seen by [name] CNM  Here for ROB with [name]  Had some spotting after sex yesterday, was worried, but resolved with time.  It's a girl -- name is a secret   Weight: 87.6 kg (193 lb 2 oz)  BP (Non-Invasive): 118/66 mmHg  Protein: Negative  Glucose: Negative  Ketones: Negative  Preterm Labor: Weeks Gestation (Enter Comment)  Fetal Movement: Present  Edema: Negative    A:  IUP at 3361w6d      S=d    P:  OB Warning s/s including SROM, vaginal bleeding, decreased fetal movement, and contractions reviewed.       RTO 4 weeks, sooner prn.    CNM saw patient independently.  Physician available on-site for consultation, collaboration, or referral as per CNM certification and licensure.    [name], CNM

## 2014-01-16 ENCOUNTER — Ambulatory Visit
Admission: RE | Admit: 2014-01-16 | Discharge: 2014-01-16 | Disposition: A | Discharge status: Home / Self Care | Payer: 59 | Source: Ambulatory Visit | Attending: Advanced Practice Midwife | Admitting: Advanced Practice Midwife

## 2014-01-16 ENCOUNTER — Other Ambulatory Visit (HOSPITAL_BASED_OUTPATIENT_CLINIC_OR_DEPARTMENT_OTHER): Payer: Self-pay | Admitting: Advanced Practice Midwife

## 2014-01-16 ENCOUNTER — Ambulatory Visit (HOSPITAL_BASED_OUTPATIENT_CLINIC_OR_DEPARTMENT_OTHER): Payer: 59 | Admitting: Advanced Practice Midwife

## 2014-01-16 VITALS — BP 108/64 | Wt 195.3 lb

## 2014-01-16 DIAGNOSIS — Z3490 Encounter for supervision of normal pregnancy, unspecified, unspecified trimester: Secondary | ICD-10-CM

## 2014-01-16 DIAGNOSIS — Z3400 Encounter for supervision of normal first pregnancy, unspecified trimester: Secondary | ICD-10-CM | POA: Insufficient documentation

## 2014-01-16 DIAGNOSIS — Z3402 Encounter for supervision of normal first pregnancy, second trimester: Secondary | ICD-10-CM

## 2014-01-16 LAB — 28 WEEK OB PROFILE
BASOPHILS: 0 %
BASOS ABS: 0.023 10*3/uL (ref 0.000–0.200)
EOS ABS: 0.317 THOU/uL (ref 0.000–0.500)
EOSINOPHIL: 3 %
HCT: 34.7 % (ref 33.5–45.2)
HGB: 12 g/dL (ref 11.2–15.2)
LYMPHOCYTES: 17 %
LYMPHS ABS: 2.123 THOU/uL (ref 1.000–4.800)
MCH: 31.6 pg (ref 27.4–33.0)
MCHC: 34.6 g/dL (ref 32.5–35.8)
MCV: 91.2 fL (ref 78–100)
MONOCYTES: 6 %
MONOS ABS: 0.8 10*3/uL (ref 0.300–1.000)
MPV: 9.1 fL (ref 7.5–11.5)
ONE HOUR GLUCOLA: 116 mg/dl (ref 70–134)
PLATELET COUNT: 199 10*3/uL (ref 140–450)
PMN ABS: 9.348 THOU/uL — ABNORMAL HIGH (ref 1.500–7.700)
PMN'S: 74 %
RBC: 3.81 MIL/uL (ref 3.63–4.92)
RDW: 12.9 % (ref 12.0–15.0)
WBC: 12.6 THOU/uL — ABNORMAL HIGH (ref 3.5–11.0)

## 2014-01-16 LAB — ANTIBODY SCREEN: ANTIBODY SCREEN: NEGATIVE

## 2014-01-16 NOTE — Progress Notes (Addendum)
Chief Complaint   Patient presents with    Prenatal Check      Pt presents for routine ROB visit   No c/o   Did 28 week labs today   Will hold Rhogam for next visit since only 26 6/7  today   Also agreeable to receiving pertussis vaccine next time    Obstetric History    G1   P0   T0   P0   A0   TAB0   SAB0   E0   M0   L0        Gestational age: 8589w6d    Weight: 88.6 kg (195 lb 5.2 oz)  BP (Non-Invasive): 108/64 mmHg  # of fetuses: 1  Fundal height: 28  Preterm Labor: None  Fetal Movement: Present  Presentation: Transverse  Edema: Negative  FHR (1): 130's  Comments: rhogam next visit    1.   Supervision of normal first pregnancy   Routine labs:    Rh positive      Rubella immune   First trimester screen:  Declined   Flu vaccine:    December 1014   Anatomy scan:   WNL   28 week labs:  Done today   Pertussis vaccine:    GBS culture:      2.   Rh negative   Received Rhogam in May after a fall   Third trimester prophylaxis: next visit        Disposition: Return in about 2 weeks (around 01/30/2014) for rob x 4, then weekly.    Seen independently with collaborating physician present in the clinic  Kristine RoyalBetsy Miller, CNM 01/16/2014, 23:14

## 2014-01-30 ENCOUNTER — Ambulatory Visit: Payer: 59 | Attending: Advanced Practice Midwife | Admitting: Advanced Practice Midwife

## 2014-01-30 VITALS — BP 124/72 | Ht 66.0 in | Wt 201.3 lb

## 2014-01-30 DIAGNOSIS — O360990 Maternal care for other rhesus isoimmunization, unspecified trimester, not applicable or unspecified: Secondary | ICD-10-CM | POA: Insufficient documentation

## 2014-01-30 DIAGNOSIS — Z23 Encounter for immunization: Secondary | ICD-10-CM | POA: Insufficient documentation

## 2014-01-30 DIAGNOSIS — Z3400 Encounter for supervision of normal first pregnancy, unspecified trimester: Secondary | ICD-10-CM | POA: Insufficient documentation

## 2014-01-30 DIAGNOSIS — Z3402 Encounter for supervision of normal first pregnancy, second trimester: Secondary | ICD-10-CM

## 2014-01-30 NOTE — Progress Notes (Addendum)
Patient seen by Tona Sensing CNM  Doing well  Questions regarding breast pump -- reassured  Alternatives to formal CB class discussed  Weight: 91.3 kg (201 lb 4.5 oz)  BP (Non-Invasive): 124/72 mmHg  Protein: Negative  Glucose: Negative  Ketones: Negative    A:  IUP at [redacted]w[redacted]d       S=d    P:  OB warning signs including general s/s miscarriage, vaginal bleeding, abdominal cramping, timing of fetal movement, and unusual vaginal discharge discussed and reviewed.       RTO 2 weeks, sooner prn, CNM rotation.    CNM saw patient independently.  Physician available on-site for consultation, collaboration, or referral as per CNM certification and licensure.    Tona Sensing, CNM

## 2014-01-30 NOTE — Progress Notes (Signed)
Per Tona Sensing Rhogam 300 hg given IM right gluteus and Boostrix given IM right deltoid both tolerated well. Lang Snow, LPN  5/78/4696, 11:37

## 2014-02-01 ENCOUNTER — Ambulatory Visit (HOSPITAL_BASED_OUTPATIENT_CLINIC_OR_DEPARTMENT_OTHER): Payer: 59 | Admitting: Obstetrics & Gynecology

## 2014-02-01 ENCOUNTER — Encounter (HOSPITAL_COMMUNITY): Payer: Self-pay

## 2014-02-01 ENCOUNTER — Ambulatory Visit
Admission: RE | Admit: 2014-02-01 | Discharge: 2014-02-01 | Disposition: A | Discharge status: Home / Self Care | Payer: 59 | Attending: Obstetrics & Gynecology | Admitting: Obstetrics & Gynecology

## 2014-02-01 DIAGNOSIS — O368190 Decreased fetal movements, unspecified trimester, not applicable or unspecified: Secondary | ICD-10-CM

## 2014-02-01 DIAGNOSIS — O99891 Other specified diseases and conditions complicating pregnancy: Secondary | ICD-10-CM | POA: Insufficient documentation

## 2014-02-01 DIAGNOSIS — O360990 Maternal care for other rhesus isoimmunization, unspecified trimester, not applicable or unspecified: Secondary | ICD-10-CM | POA: Insufficient documentation

## 2014-02-01 DIAGNOSIS — O9989 Other specified diseases and conditions complicating pregnancy, childbirth and the puerperium: Secondary | ICD-10-CM | POA: Insufficient documentation

## 2014-02-01 DIAGNOSIS — J309 Allergic rhinitis, unspecified: Secondary | ICD-10-CM | POA: Insufficient documentation

## 2014-02-01 NOTE — H&P (Addendum)
[place] Department of Obstetric & Gynecology      HISTORY AND PHYSICAL     PATIENT: [name]  CHART NUMBER: [id number]  DATE OF SERVICE: 02/01/2014      PRIMARY OB: CNM [place]        CC: decreased fetal movement    HPI: [name] is a 23 y.o. G1P0  At [redacted]w[redacted]d who presents to labor and delivery for decreased FM for the past 1-2 weeks. She states that fetus was previously very active but now seems less frequent. Patient has been keeping kick counts and became worried that baby was not having at least 10 kicks per hour consistently. Patient denies CP, SOB, abdominal pain, contractions, LOF, vaginal bleeding/discharge, vision changes or headache. Patient reports that this has been an uncomplicated pregnancy.       Dating Summary    Working EDD: 04/18/14 set by Noah Delaine, LPN on 40/98/11 based on Ultrasound on 09/22/13       Based On EDD GA Dif Comments GA Cyc Lut BC Entered By Date    Last Menstrual Period on 06/22/13 03/29/14 +[redacted]w[redacted]d very irregular menses, unsure     Tona Sensing, CNM 09/22/13    Ultrasound on 09/22/13 04/18/14 Working  [redacted]w[redacted]d    Noah Delaine, LPN 91/47/82    Ultrasound on 11/15/13 04/16/14 +2d  [redacted]w[redacted]d    Tona Sensing, CNM 12/19/13            OB History:  Lab Results   Component Value Date    ABORHD A NEGATIVE 08/17/2013    HGB 12.0 01/16/2014    HCT 34.7 01/16/2014         HIV: neg     VDRL:  NR      Hep B SAG:  neg      GBS:  Unknown      Rubella:  immune     GC:  neg    OB History   Gravida Para Term Preterm AB SAB TAB Ectopic Multiple Living   1               # Outcome Date GA Lbr Len/2nd Weight Sex Delivery Anes PTL Lv   1 Current                   PAST MEDICAL HISTORY:  Past Medical History   Diagnosis Date    Seasonal allergic reaction      occasional OTC meds       PAST SURGICAL HISTORY:  Past Surgical History   Procedure Laterality Date    Hx wisdom teeth extraction  2010       FAMILY HISTORY:   Denies FH of BD, MR, SZs or bleeding/clotting  disorders  Family History:  Family History   Problem Relation Age of Onset    Hypertension Mother     Spont Ab Mother     Eclampsia Sister      ?eclampsia -- BP issue, on bedrest    Spont Ab Sister     Spont Ab Sister            SOCIAL HISTORY:  Denies drug use  History   Substance Use Topics    Smoking status: Never Smoker     Smokeless tobacco: Never Used    Alcohol Use: No      Comment: occasionally once per week        CURRENT MEDICATIONS: PNV    ALLERGIES:  Review of patient's allergies  indicates no known allergies.     REVIEW OF SYSTEMS: Other than ROS in the HPI, all other systems were negative.    PHYSICAL EXAMINATION:   Filed Vitals:    02/01/14 1328 02/01/14 1329   BP:  118/77   Pulse:  90   Temp: 36.8 C (98.2 F)    Resp: 16        General: appears in good health  Lungs: Clear to auscultation bilaterally.   Cardiovascular: regular rate and rhythm  Abdomen: Soft, non-tender, Bowel sounds normal, gravid  Extremities: No cyanosis or edema    SVE: deferred  SSE: deferred  PRESENTATION: deferred  EFW:  deferred    FHRT  Baseline 130, good variability, +accels, no decels  TOCO: no contractions    ASSESSMENT/PLAN: 23 y.o. G1P0 at [redacted]w[redacted]d    1. Decreased fetal movement  -FHRT category 1 tracing, patient reports that fetus still moving, just not as much.   -Patient counseled on normal fetal movement  -No apparent fetal distress at this time.   -Patient placed on fetal monitor for over an hour with reassuring fetal heart tones and no contractions    2. Dispo  -Patient stable, fetal tracing is category 1. Patient reassured of fetal health and comfortable with discharge home. She was instructed to return to hospital with new or worsening symptoms. Patient advised to attend regularly scheduled f/u appointment at [facility] on 02/16/14.      Haydee Monica, MD 02/01/2014, 14:34  PGY-1  [facility]      I discussed and reviewed the resident's note.  I agree with the findings and plan of care as  documented in the resident's note.  Any exceptions/additions are edited/noted.    Kizzie Bane, MD  02/01/2014, 15:04

## 2014-02-01 NOTE — Discharge Instructions (Signed)
O.B. INSTRUCTIONS     Return to the [facility] if:   Your "bag of waters" breaks or you are leaking fluid   Regular contractions, or you think labor has begun   Vaginal bleeding occurs   Fetal movement decreases    If you have questions or concerns please call your provider or [facility] at [phone].

## 2014-02-16 ENCOUNTER — Ambulatory Visit: Payer: 59 | Attending: Advanced Practice Midwife | Admitting: Advanced Practice Midwife

## 2014-02-16 VITALS — BP 132/78 | Wt 202.6 lb

## 2014-02-16 DIAGNOSIS — O368190 Decreased fetal movements, unspecified trimester, not applicable or unspecified: Secondary | ICD-10-CM | POA: Insufficient documentation

## 2014-02-16 DIAGNOSIS — Z3400 Encounter for supervision of normal first pregnancy, unspecified trimester: Secondary | ICD-10-CM

## 2014-02-16 DIAGNOSIS — Z3402 Encounter for supervision of normal first pregnancy, second trimester: Secondary | ICD-10-CM

## 2014-02-16 NOTE — Progress Notes (Addendum)
Patient seen by Tona Sensing CNM  Doing well  Seen in hospital for decreased fetal movement.  Reassuring testing, discharged with instructions.  Weight: 91.9 kg (202 lb 9.6 oz)  BP (Non-Invasive): 132/78 mmHg  Protein: Negative  Glucose: Negative  Ketones: Negative    A:  IUP at [redacted]w[redacted]d      S=d    P:  OB warning signs including general s/s miscarriage, vaginal bleeding, abdominal cramping, timing of fetal movement, and unusual vaginal discharge discussed and reviewed.       RTO 2 weeks, sooner prn, CNM rotation as able.    CNM saw patient independently.  Physician available on-site for consultation, collaboration, or referral as per CNM certification and licensure.    Tona Sensing, CNM

## 2014-03-02 ENCOUNTER — Ambulatory Visit: Payer: 59 | Attending: Advanced Practice Midwife | Admitting: Advanced Practice Midwife

## 2014-03-02 VITALS — BP 108/60 | Wt 205.7 lb

## 2014-03-02 DIAGNOSIS — Z3400 Encounter for supervision of normal first pregnancy, unspecified trimester: Secondary | ICD-10-CM

## 2014-03-02 DIAGNOSIS — Z3403 Encounter for supervision of normal first pregnancy, third trimester: Secondary | ICD-10-CM

## 2014-03-02 DIAGNOSIS — O360990 Maternal care for other rhesus isoimmunization, unspecified trimester, not applicable or unspecified: Secondary | ICD-10-CM | POA: Insufficient documentation

## 2014-03-04 NOTE — Progress Notes (Addendum)
Chief Complaint   Patient presents with    Prenatal Check      Pt presents for routine ROB visit   No c/o   Glad to be close to "home stretch"     Answered questions about L&D       Obstetric History    G1   P0   T0   P0   A0   TAB0   SAB0   E0   M0   L0        Gestational age: [redacted]w[redacted]d    Weight: 93.3 kg (205 lb 11 oz)  BP (Non-Invasive): 108/60 mmHg  Fundal height: 32  Preterm Labor: None  Fetal Movement: Present  Edema: Negative  FHR (1): 130's      ICD-9-CM    1. Supervision of normal first pregnancy, third trimester V22.0 POCT URINE DIPSTICK   1.   Supervision of normal first pregnancy   Routine labs:    Rh positive      Rubella immune   First trimester screen:  Declined   Flu vaccine:    December 1014   Anatomy scan:   WNL   28 week labs:     Pertussis vaccine: Received 01/30/14   GBS culture:      2.   Rh negative   Received Rhogam in May after a fall   Received prophylaxis 01/30/14      Disposition: Return in about 2 weeks (around 03/16/2014) for rob.    Seen independently with collaborating physician available by phone  Kristine Royal, CNM 03/04/2014, 10:26   Jola Babinski, MD  03/20/2014, 08:50

## 2014-03-13 ENCOUNTER — Other Ambulatory Visit: Payer: Self-pay

## 2014-03-16 ENCOUNTER — Ambulatory Visit: Payer: 59 | Attending: Obstetrics & Gynecology | Admitting: Advanced Practice Midwife

## 2014-03-16 VITALS — BP 118/78 | Ht 67.0 in | Wt 207.7 lb

## 2014-03-16 DIAGNOSIS — Z3483 Encounter for supervision of other normal pregnancy, third trimester: Secondary | ICD-10-CM | POA: Insufficient documentation

## 2014-03-16 DIAGNOSIS — Z3403 Encounter for supervision of normal first pregnancy, third trimester: Secondary | ICD-10-CM

## 2014-03-16 DIAGNOSIS — Z23 Encounter for immunization: Secondary | ICD-10-CM | POA: Insufficient documentation

## 2014-03-16 NOTE — Progress Notes (Addendum)
Patient seen by Tona SensingLena Cerbone CNM  HEre with FOB for ROB  Occasional contractions  Weight: 94.2 kg (207 lb 10.8 oz)  BP (Non-Invasive): 118/78 mmHg  Protein: Negative  Glucose: Negative  Ketones: Negative    A:  IUP at 5341w2d       S=d    P:  Discussed when to come to [facility] in labor.  Early labor precautions discussed.  How and when to call reviewed.          OB warning signs including general s/s miscarriage, vaginal bleeding, abdominal cramping, timing of fetal movement, and unusual vaginal discharge discussed and reviewed.       RTO weekly through 11/11, sooner prn.    CNM saw patient independently.  Physician available on-site for consultation, collaboration, or referral as per CNM certification and licensure.    Tona SensingLena Cerbone, CNM

## 2014-03-16 NOTE — Progress Notes (Signed)
Per Tona SensingLena Cerbone Fluarix given IM left deltoid. Pt tolerated well. Lang SnowKelly Jo Adon Gehlhausen, LPN  03/4/742510/02/2014, 16:01

## 2014-03-23 ENCOUNTER — Ambulatory Visit
Admission: RE | Admit: 2014-03-23 | Discharge: 2014-03-23 | Disposition: A | Discharge status: Home / Self Care | Payer: 59 | Source: Ambulatory Visit | Attending: OBSTETRICS/GYNECOLOGY | Admitting: OBSTETRICS/GYNECOLOGY

## 2014-03-23 ENCOUNTER — Ambulatory Visit (HOSPITAL_BASED_OUTPATIENT_CLINIC_OR_DEPARTMENT_OTHER): Payer: 59 | Admitting: Advanced Practice Midwife

## 2014-03-23 VITALS — BP 124/74 | Ht 67.0 in | Wt 208.8 lb

## 2014-03-23 DIAGNOSIS — Z3483 Encounter for supervision of other normal pregnancy, third trimester: Secondary | ICD-10-CM

## 2014-03-23 DIAGNOSIS — Z3403 Encounter for supervision of normal first pregnancy, third trimester: Secondary | ICD-10-CM

## 2014-03-23 DIAGNOSIS — Z3493 Encounter for supervision of normal pregnancy, unspecified, third trimester: Secondary | ICD-10-CM | POA: Insufficient documentation

## 2014-03-23 NOTE — Progress Notes (Addendum)
Patient seen by Tona SensingLena Cerbone CNM  HEre for ROB with husband.  Got car seat this week.  Weight: 94.7 kg (208 lb 12.4 oz)  BP (Non-Invasive): 124/74 mmHg  Protein: Negative  Glucose: Negative  Ketones: Negative    A:  IUP at 1040w2d       S=d    P:  OB warning signs including general s/s miscarriage, vaginal bleeding, abdominal cramping, timing of fetal movement, and unusual vaginal discharge discussed and reviewed.       RTO weekly, sooner prn.      GBS culture today.    CNM saw patient independently.  Physician available by phone for consultation, collaboration, or referral as per CNM certification and licensure.    Tona SensingLena Cerbone, CNM  Jola Babinskiharles Kenyah Luba, MD  03/26/2014, 12:37

## 2014-03-25 LAB — GROUP B STREP

## 2014-03-25 LAB — GROUP B STREP CULTURE

## 2014-03-30 ENCOUNTER — Ambulatory Visit: Payer: 59 | Attending: Obstetrics & Gynecology | Admitting: Advanced Practice Midwife

## 2014-03-30 VITALS — BP 124/76 | Wt 211.9 lb

## 2014-03-30 DIAGNOSIS — Z3403 Encounter for supervision of normal first pregnancy, third trimester: Secondary | ICD-10-CM

## 2014-03-30 DIAGNOSIS — Z3A37 37 weeks gestation of pregnancy: Secondary | ICD-10-CM | POA: Insufficient documentation

## 2014-03-30 DIAGNOSIS — O360930 Maternal care for other rhesus isoimmunization, third trimester, not applicable or unspecified: Secondary | ICD-10-CM | POA: Insufficient documentation

## 2014-03-30 NOTE — Progress Notes (Addendum)
Chief Complaint   Patient presents with    Routine Prenatal Visit      Pt presents for routine ROB visit   Many questions about labor   Excited/nervous    Obstetric History    G1   P0   T0   P0   A0   TAB0   SAB0   E0   M0   L0        Gestational age: 4714w2d    Weight: 96.1 kg (211 lb 13.8 oz)  BP (Non-Invasive): 124/76 mmHg  # of fetuses: 1  Fundal height: 36  Preterm Labor: Deberah PeltonBraxton Hicks  Fetal Movement: Present  Presentation: Vertex  Edema: Trace  FHR (1): 130's  Comments: GBS neg      ICD-10-CM    1. Supervision of normal first pregnancy, third trimester Z34.03 POCT URINE DIPSTICK   1.   Supervision of normal first pregnancy   Routine labs:    Rh positive      Rubella immune   First trimester screen:  Declined   Flu vaccine:    October 2015   Anatomy scan:   WNL   28 week labs:  WNL   Pertussis vaccine: Received 01/30/14   GBS culture:  GBS negative     2.   Rh negative   Received Rhogam in May after a fall   Received prophylaxis 01/30/14    Disposition: Return in about 1 week (around 04/06/2014) for rob.    Seen independently with collaborating physician present in the clinic  Kristine RoyalBetsy Miller, CNM 03/30/2014, 15:27   I agree with the findings and plan of care as documented in the note.  Any exceptions/additions are edited/noted.

## 2014-04-04 ENCOUNTER — Ambulatory Visit (HOSPITAL_COMMUNITY): Payer: Self-pay | Admitting: Obstetrics & Gynecology

## 2014-04-04 NOTE — Telephone Encounter (Signed)
-----   Message from Rexene EdisonJennifer Boyd, RN STUDENT sent at 04/04/2014  6:31 PM EDT -----  >> Rexene EdisonJENNIFER BOYD, RN STUDENT 04/04/2014 06:31 PM  Patient calling to advise she is [redacted] weeks pregnant and she thinks she may be having contractions but isn't sure. Paged Dr. Boykin ReaperMatta to advise. Please call [name] at [phone] at your earliest convenience. Thank you! JAB

## 2014-04-04 NOTE — Telephone Encounter (Signed)
[name]  [id number]  04/04/2014    Patient called [facility] line reporting abdominal pain that she was concern for contractions, which started after eating. She is concern because the pain was only on the right side of the abdomen. The pain went away without any medication. Denies nausea, vomiting, vaginal bleeding, LoF and reports adequate FM. Reviewed Labor precautions with the patient.    Labor Precautions (When to Call The Doctor)  1. When you have painful, regular contractions every 8-10 minutes apart for at least one hour without stopping.  2. If your bag of water breaks. Sometimes there is a big gush of fluid, and sometimes it is just a continuous trickle. Don't wait for contractions to start before you call.  3. If you have vaginal bleeding that is continuous and as heavy as a menstrual period. It is normal to pass some blood with mucous before you go into labor. This is called "bloody show" or a "mucous plug."  4. If your baby does not move at all during any four hour period while you are awake. It is not necessary to stay awake all night to monitor your baby's movements.  5. If your baby moves much less one day than it did the day before. Before you call, try eating something or drinking something sweet, like fruit juice, to try to stimulate the baby's movements. The baby often sems to move more at night and after meals than at any other time.  6. If you are having severe, continuous abdominal pains.    For any of the above symptoms, please call [phone] and ask to speak with the Labor & Delivery Team.    Patient verbalized understanding    Georges LynchMaria Jessi Jessop, MD  04/04/2014, 18:47

## 2014-04-06 ENCOUNTER — Ambulatory Visit: Payer: 59 | Attending: Obstetrics & Gynecology | Admitting: Advanced Practice Midwife

## 2014-04-06 VITALS — BP 120/84 | Ht 67.0 in | Wt 212.3 lb

## 2014-04-06 DIAGNOSIS — Z3403 Encounter for supervision of normal first pregnancy, third trimester: Secondary | ICD-10-CM

## 2014-04-06 DIAGNOSIS — Z6791 Unspecified blood type, Rh negative: Secondary | ICD-10-CM

## 2014-04-06 DIAGNOSIS — Z3493 Encounter for supervision of normal pregnancy, unspecified, third trimester: Secondary | ICD-10-CM | POA: Insufficient documentation

## 2014-04-06 DIAGNOSIS — O26899 Other specified pregnancy related conditions, unspecified trimester: Secondary | ICD-10-CM

## 2014-04-06 NOTE — Progress Notes (Addendum)
Patient seen by Tona SensingLena Cerbone CNM  HEre for ROB with husband  One contraction yesterday.  None since.       A:  IUP at 4266w2d       S=d    P:  [facility]  Early labor precautions discussed.  How and when to call reviewed.        rTO weekly through 04/18/14    CNM saw patient independently.  Physician available on-site for consultation, collaboration, or referral as per CNM certification and licensure.    Tona SensingLena Cerbone, CNM

## 2014-04-13 ENCOUNTER — Ambulatory Visit: Payer: 59 | Attending: Advanced Practice Midwife | Admitting: Advanced Practice Midwife

## 2014-04-13 VITALS — BP 128/78 | Ht 67.0 in | Wt 212.7 lb

## 2014-04-13 DIAGNOSIS — Z3403 Encounter for supervision of normal first pregnancy, third trimester: Secondary | ICD-10-CM

## 2014-04-13 DIAGNOSIS — Z3493 Encounter for supervision of normal pregnancy, unspecified, third trimester: Secondary | ICD-10-CM | POA: Insufficient documentation

## 2014-04-13 DIAGNOSIS — Z3A39 39 weeks gestation of pregnancy: Secondary | ICD-10-CM | POA: Insufficient documentation

## 2014-04-13 NOTE — Progress Notes (Addendum)
Patient seen by Tona SensingLena Cerbone CNM  HEre with FOB for ROB  No regular contractions  Declines VE  Weight: 96.5 kg (212 lb 11.9 oz)  BP (Non-Invasive): 128/78 mmHg  Protein: Negative  Glucose: Negative  Ketones: Negative    A:  IUP at 6072w2d       S=d    P:  [facility]  Early labor precautions discussed.  How and when to call reviewed.        rTO next week, sooner prn, NST and GBS at that visit    CNM saw patient independently.  Physician available on-site for consultation, collaboration, or referral as per CNM certification and licensure.    Tona SensingLena Cerbone, CNM

## 2014-04-19 ENCOUNTER — Other Ambulatory Visit (HOSPITAL_COMMUNITY): Payer: Self-pay

## 2014-04-19 ENCOUNTER — Ambulatory Visit (HOSPITAL_BASED_OUTPATIENT_CLINIC_OR_DEPARTMENT_OTHER): Payer: 59 | Admitting: Obstetrics & Gynecology

## 2014-04-19 ENCOUNTER — Encounter (HOSPITAL_COMMUNITY): Payer: Self-pay

## 2014-04-19 ENCOUNTER — Ambulatory Visit (HOSPITAL_COMMUNITY)
Admission: RE | Admit: 2014-04-19 | Discharge: 2014-04-20 | Disposition: A | Discharge status: Home / Self Care | Payer: 59 | Source: Home / Self Care | Attending: Obstetrics & Gynecology | Admitting: Obstetrics & Gynecology

## 2014-04-19 DIAGNOSIS — Z3A40 40 weeks gestation of pregnancy: Secondary | ICD-10-CM

## 2014-04-19 DIAGNOSIS — O360930 Maternal care for other rhesus isoimmunization, third trimester, not applicable or unspecified: Secondary | ICD-10-CM

## 2014-04-19 DIAGNOSIS — O471 False labor at or after 37 completed weeks of gestation: Secondary | ICD-10-CM

## 2014-04-19 NOTE — H&P (Addendum)
[place] Department of Obstetric & Gynecology      HISTORY AND PHYSICAL     PATIENT: Katie HaSarah Melissa Oliver  CHART NUMBER: [id number]  DATE OF SERVICE: 04/19/2014      PRIMARY OB: CNM         CC: contractions     HPI: [name] is a 23 y.o. G1P0  At 6158w1d who presents to labor and delivery for concern for contractions. Reports they have been throughout the day today but have increased in frequency and intensity. Feels they are every 5-6 minutes. Denies vaginal bleeding or leaking. Notes some mucous like discharge over the last several days but no irritation. + FM.        Dating Summary     Working EDD: 04/18/14 set by Noah DelaineLamp, Heather N, LPN on 40/98/1104/17/15 based on Ultrasound on 09/22/13       Based On EDD GA Dif Comments GA Cyc Lut BC Entered By Date    Last Menstrual Period on 06/22/13 03/29/14 +3514w6d very irregular menses, unsure     Tona SensingCerbone, Lena, CNM 09/22/13    Ultrasound on 09/22/13 04/18/14 Working  4958w2d    Noah DelaineLamp, Heather N, LPN 91/47/8204/17/15    Ultrasound on 11/15/13 04/16/14 +2d  2977w2d    Tona SensingCerbone, Lena, CNM 12/19/13            OB History:  Lab Results   Component Value Date    ABORHD A NEGATIVE 08/17/2013    HGB 12.0 01/16/2014    HCT 34.7 01/16/2014         HIV: neg      VDRL:  NR       Hep B SAG:  neg      GBS:  -, 10/16      Rubella:  immune     GC:  neg    OB History   Gravida Para Term Preterm AB SAB TAB Ectopic Multiple Living   1               # Outcome Date GA Lbr Len/2nd Weight Sex Delivery Anes PTL Lv   1 Current                   PAST MEDICAL HISTORY:  Past Medical History   Diagnosis Date    Seasonal allergic reaction      occasional OTC meds           PAST SURGICAL HISTORY:  Past Surgical History   Procedure Laterality Date    Hx wisdom teeth extraction  2010           FAMILY HISTORY:   denies FH of BD, MR, SZs or bleeding/clotting disorders  Family History   Problem Relation Age of Onset    Hypertension Mother     Spont Ab Mother     Eclampsia Sister      ?eclampsia --  BP issue, on bedrest    Spont Ab Sister     Spont Ab Sister            SOCIAL HISTORY:  denies drug use  History   Substance Use Topics    Smoking status: Never Smoker     Smokeless tobacco: Never Used    Alcohol Use: No      Comment: occasionally once per week        CURRENT MEDICATIONS:   PNV     ALLERGIES:  Review of patient's allergies indicates no known allergies.     REVIEW  OF SYSTEMS: Other than ROS in the HPI, all other systems were negative.    PHYSICAL EXAMINATION:   Filed Vitals:    04/19/14 1928   BP: 138/81   Pulse: 84   Temp: 37.5 C (99.5 F)       General: appears in good health  Lungs: No respiratory distress  Abdomen: Soft, non-tender  Extremities: No cyanosis or edema  Psych: alert and oriented x 3  SVE: 3-4/90/-2  SSE: deferred  PRESENTATION:  Cephalic by exam   EFW: 3600 g     FHRT  120 mod var + accel no decel   TOCO: irregular       ASSESSMENT/PLAN: 23 y.o. G1P0 at 5160w1d    1. Contractions  -Will monitor for active labor   -Ambulate and re examine  -Will check AFI given >40 weeks   -GBS negative  -Cat I FHT     2. RH neg   -Will need rhogam work up post partum       Gari Crownose Laignel, DO 04/19/2014, 20:10  PGY-4  [facility]  Department of Obstetrics & Gynecology                    Late entry for 04/19/14. I saw and examined the patient.  I reviewed the resident's note.  I agree with the findings and plan of care as documented in the resident's note.  Any exceptions/additions are edited/noted.    Kizzie BanePetronela Sandara Tyree, MD 04/20/2014, 06:50

## 2014-04-20 ENCOUNTER — Inpatient Hospital Stay (HOSPITAL_COMMUNITY): Payer: 59 | Admitting: OB/GYN MFM HIGH RISK

## 2014-04-20 ENCOUNTER — Inpatient Hospital Stay
Admission: RE | Admit: 2014-04-20 | Discharge: 2014-04-22 | DRG: 775 | Disposition: A | Discharge status: Home / Self Care | Payer: 59 | Attending: Obstetrics & Gynecology | Admitting: Obstetrics & Gynecology

## 2014-04-20 ENCOUNTER — Ambulatory Visit (HOSPITAL_BASED_OUTPATIENT_CLINIC_OR_DEPARTMENT_OTHER): Payer: 59 | Admitting: Advanced Practice Midwife

## 2014-04-20 ENCOUNTER — Encounter (HOSPITAL_COMMUNITY): Payer: Self-pay

## 2014-04-20 VITALS — BP 122/74 | Ht 67.0 in | Wt 214.9 lb

## 2014-04-20 DIAGNOSIS — Z3493 Encounter for supervision of normal pregnancy, unspecified, third trimester: Secondary | ICD-10-CM | POA: Insufficient documentation

## 2014-04-20 DIAGNOSIS — O326XX0 Maternal care for compound presentation, not applicable or unspecified: Principal | ICD-10-CM | POA: Diagnosis present

## 2014-04-20 DIAGNOSIS — Z3A40 40 weeks gestation of pregnancy: Secondary | ICD-10-CM

## 2014-04-20 DIAGNOSIS — IMO0001 Reserved for inherently not codable concepts without codable children: Secondary | ICD-10-CM | POA: Diagnosis present

## 2014-04-20 DIAGNOSIS — Z3403 Encounter for supervision of normal first pregnancy, third trimester: Secondary | ICD-10-CM

## 2014-04-20 DIAGNOSIS — O360930 Maternal care for other rhesus isoimmunization, third trimester, not applicable or unspecified: Secondary | ICD-10-CM | POA: Diagnosis present

## 2014-04-20 DIAGNOSIS — O328XX1 Maternal care for other malpresentation of fetus, fetus 1: Secondary | ICD-10-CM

## 2014-04-20 DIAGNOSIS — O480 Post-term pregnancy: Secondary | ICD-10-CM

## 2014-04-20 LAB — DRUG SCREEN, NO CONFIRMATION, URINE
AMPHETAMINE, URINE: NEGATIVE
BARBITURATE, URINE: NEGATIVE
BENZODIAZEPINE,URINE: NEGATIVE
BUPRENORPHINE, URINE QL: NEGATIVE
BUPRENORPHINE, URINE QL: NEGATIVE
CANNABINOID (THC), URINE: NEGATIVE
COCAINE METAB. URINE: NEGATIVE
ECSTASY/MDMA QUAL, URINE: NEGATIVE
METHADONE, URINE: NEGATIVE
OPIATE, URINE (LOW CUTOFF): NEGATIVE
OXYCODONE QUAL, URINE: NEGATIVE
RANDOM URINE CREATININE: 134 mg/dL (ref 20–200)

## 2014-04-20 LAB — CBC/DIFF
BASOPHILS: 0 %
BASOS ABS: 0.039 THOU/uL (ref 0.000–0.200)
EOS ABS: 0.093 10*3/uL (ref 0.000–0.500)
EOSINOPHIL: 1 %
HCT: 39.3 % (ref 33.5–45.2)
HGB: 13.5 g/dL (ref 11.2–15.2)
LYMPHOCYTES: 10 %
LYMPHS ABS: 1.858 10*3/uL (ref 1.000–4.800)
LYMPHS ABS: 1.858 THOU/uL (ref 1.000–4.800)
MCH: 30.8 pg (ref 27.4–33.0)
MCHC: 34.4 g/dL (ref 32.5–35.8)
MCV: 89.5 fL (ref 78–100)
MONOCYTES: 5 %
MONOS ABS: 1.022 THOU/uL — ABNORMAL HIGH (ref 0.300–1.000)
PLATELET COUNT: 187 THOU/uL (ref 140–450)
PMN ABS: 15.757 10*3/uL — ABNORMAL HIGH (ref 1.500–7.700)
PMN'S: 84 %
RBC: 4.39 MIL/uL (ref 3.63–4.92)
RDW: 13.6 % (ref 12.0–15.0)
WBC: 18.8 10*3/uL — ABNORMAL HIGH (ref 3.5–11.0)

## 2014-04-20 LAB — TYPE AND SCREEN
ABO/RH(D): A NEG
ANTIBODY SCREEN: NEGATIVE

## 2014-04-20 MED ORDER — SODIUM CHLORIDE 0.9 % (FLUSH) INJECTION SYRINGE
2.0000 mL | INJECTION | INTRAMUSCULAR | Status: DC | PRN
Start: 2014-04-20 — End: 2014-04-22

## 2014-04-20 MED ORDER — HYDROCODONE 5 MG-ACETAMINOPHEN 325 MG TABLET
1.0000 | ORAL_TABLET | ORAL | Status: DC | PRN
Start: 2014-04-20 — End: 2014-04-22
  Administered 2014-04-21 – 2014-04-22 (×2): 1 via ORAL
  Filled 2014-04-20 (×2): qty 1

## 2014-04-20 MED ORDER — SODIUM CHLORIDE 0.9 % (FLUSH) INJECTION SYRINGE
2.0000 mL | INJECTION | Freq: Three times a day (TID) | INTRAMUSCULAR | Status: DC
Start: 2014-04-20 — End: 2014-04-22
  Administered 2014-04-20: 0 mL
  Administered 2014-04-21: 2 mL
  Administered 2014-04-21: 0 mL
  Administered 2014-04-21 – 2014-04-22 (×2): 2 mL

## 2014-04-20 MED ORDER — LACTATED RINGERS IV BOLUS
500.0000 mL | INJECTION | Status: DC | PRN
Start: 2014-04-20 — End: 2014-04-20

## 2014-04-20 MED ORDER — FENTANYL (PF) 50 MCG/ML INJECTION SOLUTION
100.0000 ug | INTRAMUSCULAR | Status: DC | PRN
Start: 2014-04-20 — End: 2014-04-20

## 2014-04-20 MED ORDER — LACTATED RINGERS INTRAVENOUS SOLUTION
INTRAVENOUS | Status: DC
Start: 2014-04-20 — End: 2014-04-20

## 2014-04-20 MED ORDER — DOCUSATE SODIUM 100 MG CAPSULE
100.0000 mg | ORAL_CAPSULE | Freq: Two times a day (BID) | ORAL | Status: DC
Start: 2014-04-21 — End: 2014-04-22
  Administered 2014-04-21 – 2014-04-22 (×3): 100 mg via ORAL
  Filled 2014-04-20 (×3): qty 1

## 2014-04-20 MED ORDER — LIDOCAINE HCL 20 MG/ML (2 %) INJECTION SOLUTION
INTRAMUSCULAR | Status: AC
Start: 2014-04-20 — End: 2014-04-20
  Filled 2014-04-20: qty 20

## 2014-04-20 MED ORDER — IBUPROFEN 600 MG TABLET
600.0000 mg | ORAL_TABLET | Freq: Four times a day (QID) | ORAL | Status: DC | PRN
Start: 2014-04-20 — End: 2014-04-22
  Administered 2014-04-21 – 2014-04-22 (×5): 600 mg via ORAL
  Filled 2014-04-20 (×5): qty 1

## 2014-04-20 MED ORDER — OXYTOCIN 30 UNIT/500 ML IN 0.9 % SODIUM CHLORIDE INTRAVENOUS
45.00 m[IU]/min | INTRAVENOUS | Status: AC
Start: 2014-04-21 — End: 2014-04-21
  Administered 2014-04-20: 45 m[IU]/min via INTRAVENOUS
  Administered 2014-04-20: 0 m[IU]/min via INTRAVENOUS

## 2014-04-20 MED ORDER — RHO(D) IMMUNE GLOBULIN 1,500 UNIT (300 MCG) INTRAMUSCULAR SYRINGE
300.0000 ug | INJECTION | Freq: Once | INTRAMUSCULAR | Status: AC
Start: 2014-04-21 — End: 2014-04-21
  Administered 2014-04-21: 300 ug via INTRAMUSCULAR
  Filled 2014-04-20: qty 300

## 2014-04-20 MED ORDER — GLYCERIN-WITCH HAZEL 12.5 %-50 % TOPICAL PADS
1.0000 | MEDICATED_PAD | CUTANEOUS | Status: DC | PRN
Start: 2014-04-20 — End: 2014-04-22
  Administered 2014-04-21: 1 via CUTANEOUS

## 2014-04-20 MED ADMIN — sodium chloride 0.9 % intravenous solution: @ 22:00:00 | NDC 00338004904

## 2014-04-20 MED ADMIN — lactated Ringers intravenous solution: @ 22:00:00 | NDC 00264775000

## 2014-04-20 NOTE — Progress Notes (Addendum)
S:  Pt reports intermittent abdominal pain. Hopes to avoid epidural.  O:  FHR:  Category I tracing. Contractions every 5 minutes.   Filed Vitals:    04/20/14 1906 04/20/14 1911   BP: 136/88    Pulse: 101    Temp:  37.6 C (99.7 F)     A:  IUP at 40 weeks/2 days       RH negative    P:  Intermittent monitoring       Check cervix at 9 pm       Fenanyl IV prn       Anticipate NSVD.  [place], CNM  04/20/2014, 20:37

## 2014-04-20 NOTE — H&P (Addendum)
Department of Obstetric & Gynecology      HISTORY AND PHYSICAL     PATIENT: [name]  CHART NUMBER: [id number]  DATE OF SERVICE: 04/20/2014      PRIMARY OB: CNM         CC: contractions     HPI: [name] is a 23 y.o. G1P0  At 5375w2d  who presents to labor and delivery for active labor. Contractions have worsened throughout the day since d/c home this morning. No leaking of fluid. Reports bloody show. + FM.   No other complaints.        Dating Summary     Working EDD: 04/18/14 set by Noah DelaineLamp, Heather N, LPN on 40/98/1104/17/15 based on Ultrasound on 09/22/13       Based On EDD GA Dif Comments GA Cyc Lut BC Entered By Date    Last Menstrual Period on 06/22/13 03/29/14 +1320w6d very irregular menses, unsure     Tona SensingCerbone, Lena, CNM 09/22/13    Ultrasound on 09/22/13 04/18/14 Working  4654w2d    Noah DelaineLamp, Heather N, LPN 91/47/8204/17/15    Ultrasound on 11/15/13 04/16/14 +2d  4769w2d    Tona SensingCerbone, Lena, CNM 12/19/13            OB History:  Lab Results   Component Value Date    ABORHD A NEGATIVE 08/17/2013    HGB 12.0 01/16/2014    HCT 34.7 01/16/2014         HIV: neg      VDRL:  NR       Hep B SAG:  neg      GBS:  -, 10/16      Rubella:  immune     GC:  neg    OB History   Gravida Para Term Preterm AB SAB TAB Ectopic Multiple Living   1               # Outcome Date GA Lbr Len/2nd Weight Sex Delivery Anes PTL Lv   1 Current                   PAST MEDICAL HISTORY:  Past Medical History   Diagnosis Date    Seasonal allergic reaction      occasional OTC meds           PAST SURGICAL HISTORY:  Past Surgical History   Procedure Laterality Date    Hx wisdom teeth extraction  2010           FAMILY HISTORY:   denies FH of BD, MR, SZs or bleeding/clotting disorders  Family History   Problem Relation Age of Onset    Hypertension Mother     Spont Ab Mother     Eclampsia Sister      ?eclampsia -- BP issue, on bedrest    Spont Ab Sister     Spont Ab Sister            SOCIAL HISTORY:  denies drug use  History     Substance Use Topics    Smoking status: Never Smoker     Smokeless tobacco: Never Used    Alcohol Use: No      Comment: occasionally once per week        CURRENT MEDICATIONS:   PNV     ALLERGIES:  Review of patient's allergies indicates no known allergies.     REVIEW OF SYSTEMS: Other than ROS in the HPI, all other systems were negative.  PHYSICAL EXAMINATION:   Filed Vitals:    04/20/14 1906 04/20/14 1911   BP: 136/88    Pulse: 101    Temp:  37.6 C (99.7 F)       General: appears in good health  Lungs: No respiratory distress  Abdomen: Soft, non-tender  Extremities: No cyanosis or edema  Psych: alert and oriented x 3  SVE: 7-8/100/0  SSE: deferred  PRESENTATION:  Cephalic by exam   EFW: 3600 g     FHRT  120 mod var + accel no decel   TOCO: q3-5 minutes       ASSESSMENT/PLAN: 23 y.o. G1P0 at 6080w2d     1. Contractions  -Will admit   -Labs collected  -CNM to assume care   -Pain management options reviewed   -Cat I FHT, intermittent monitoring   -GBS negative    2. RH neg   -Will need rhogam work up post partum       Gari Crownose Laignel, DO 04/20/2014, 20:06  PGY-4  [facility]  Department of Obstetrics & Gynecology      Late entry for 04/20/14. I saw and examined the patient.  I reviewed the resident's note.  I agree with the findings and plan of care as documented in the resident's note.  Any exceptions/additions are edited/noted.    Francisca Decemberourtney Jaeden [place], MD 04/21/2014, 08:29

## 2014-04-20 NOTE — Progress Notes (Addendum)
Patient seen by Tona SensingLena Cerbone CNM  Here with husband for ROB  At hospital to r/o labor -- sent home yesterday morning at 6 am, 4 cm dilated  Has been contracting since.  + bloody show  No SROM  Weight: 97.5 kg (214 lb 15.2 oz)  BP (Non-Invasive): 122/74 mmHg  Protein: Negative  Glucose: Negative  Ketones: Negative  UA Comments: HEM TRACE BLOOD    A:  IUP at 5727w2d       S=d    P:  Home with labor precautions        [facility]       [facility]  Early labor precautions discussed.  How and when to call reviewed.     CNM saw patient independently.  Physician available by phone for consultation, collaboration, or referral as per CNM certification and licensure.    Tona SensingLena Cerbone, CNM  Jola Babinskiharles Briarrose Shor, MD  04/20/2014, 11:45

## 2014-04-20 NOTE — Discharge Instructions (Signed)
O.B. INSTRUCTIONS     Return to the [facility] if:   Your "bag of waters" breaks or you are leaking fluid   Regular contractions, or you think labor has begun   Vaginal bleeding occurs   Fetal movement decreases   If you have any questions or concerns please contact your provider or [facility] at [phone]   Please go to your scheduled appointment

## 2014-04-20 NOTE — Progress Notes (Addendum)
Cervix 9 at 0 station  [place]quette[place]sta Hill, [place]  04/20/2014, 21:19

## 2014-04-20 NOTE — Progress Notes (Addendum)
S: Pt c/o persistent contractions every 5 minutes since walking.  She denies LOF and VB.    O: BP 136/90 mmHg   Pulse 75   Temp(Src) 37 C (98.6 F)   LMP 06/22/2013   GEN: NAD   ABD: gravid, soft, non tender   SVE: 4/90/-2     AFI 8.23cm on bedside US     A/P: 23 y.o. G1P0 at 10110w2d r/o Labor   Likely latent labor   Pt uncomfortable going home, desires exam with striping of membranes and observation.   Declines therapeutic rest and labor augmentation at this time.   Intermittent EFM/Toco   Regular Diet     D/w Dr. Burton ApleyLaignel and Kristine RoyalBetsy Miller, CNM   Thurnell Garbehristina Lyn Cox, MD  04/20/2014, 00:47    Patient rested through the night  Cervix unchanged per Dr. Sedalia Mutaox   VSS, one mild range blood pressure, repeat okay 133/88  Urine dip negative.   Patient asymptomatic   Augmentation offered and patient declined.  Labor precautions/S&S preeclampsia  reviewed and patient d/c home     [facility]     Gari Crownose Laignel, DO 04/20/2014, 05:22  PGY-4  [facility] [place]  Department of Obstetrics & Gynecology       I discussed and reviewed the resident's note.  I agree with the findings and plan of care as documented in the resident's note.  Any exceptions/additions are edited/noted.    Kizzie BanePetronela Strider Vallance, MD  04/20/2014, 06:33

## 2014-04-21 ENCOUNTER — Encounter (HOSPITAL_COMMUNITY): Payer: Self-pay

## 2014-04-21 LAB — H & H
HCT: 36.7 % (ref 33.5–45.2)
HGB: 12.6 g/dL (ref 11.2–15.2)

## 2014-04-21 LAB — TEST FOR FETAL CELLS: FETAL SCREEN (ROSETTE TEST): POSITIVE

## 2014-04-21 MED ORDER — IBUPROFEN 600 MG TABLET
600.00 mg | ORAL_TABLET | Freq: Four times a day (QID) | ORAL | Status: DC | PRN
Start: 2014-04-21 — End: 2014-07-20

## 2014-04-21 NOTE — Lactation Note (Signed)
This note was copied from the chart of [name].  Visited with m/b pair to assist with infant feeding.  Baby was fussy initially and had a large emesis of clear to colostrum colored emesis.  She also had a large transitional stool.  After diaper change, she was alert and placed in football hold for feeding.  Baby latched easily with mom shown to position hands and also effectively re-latch infant on her own.  Parents were pleased with feeding and ease of position and latch.  Infant came off breast after 15 minutes and was asleep.  Instructed on pumping and milk storage as mom will be returning to classes the week after thanksgiving.  She will have a wic pump.  Given [email or web address] website to use as a reference for bf info.  Lactation card given and mom made aware of lactation clinics, but will most likely follow with wic.  Also reviewed pacing bottle feeds when started.  Parents receptive to teaching .

## 2014-04-21 NOTE — Care Plan (Signed)
Problem: Labor (Cervical Ripen, Induct, Augment) (Adult,Obstetrics,Pediatric)  Prevent and manage potential problems including:1. pain2. anaphylactoid syndrome of pregnancy3. fetal well-being change4. chorioamnionitis5. intrapartum bleeding/hemorrhage6. intrauterine fetal death (IUFD)7. labor dystocia8. malpresentation9. precipitous delivery10. shoulder [id number]. umbilical cord [id number]. uterine tachysystole   Goal: Signs and Symptoms of Listed Potential Problems Will be Absent or Manageable (Labor)  Signs and symptoms of listed potential problems will be absent or manageable by discharge/transition of care (reference Labor (Cervical Ripen, Induct, Augment) (Adult,Obstetrics,Pediatric) CPG).   Outcome: Outcome Achieved Date Met:  04/21/14

## 2014-04-21 NOTE — Progress Notes (Addendum)
POST PARTUM PROGRESS NOTE    PATIENT: [name]  CHART NUMBER: [id number]  DATE OF SERVICE: 04/21/2014      SUBJECTIVE: [name] is a 23 y.o. G1P1001 PPD #1 s/p SVD over repaired vaginal laceration. Pt doing well with no complaints. Ambulating and urinating without difficulty. Tolerating regular diet with no N/V. + Flatus, no BM. Denies CP, SOB or calf tenderness. Moderate lochia, moderate cramping. Breast-feeding female infant. Undecided on contraception.      OBJECTIVE:   Filed Vitals:    04/21/14 0125 04/21/14 0126 04/21/14 0127 04/21/14 0529   BP: 127/76   125/80   Pulse: 94   85   Temp:  37.8 C (100 F) 37.1 C (98.8 F)    Resp:         GENERAL: NAD  CARDIAC: RRR  PULM: CTAB,   ABD: Soft, NTTP  FUNDUS: Firm below level of umbilicus  EXT: No calf tenderness, trace edema      ASSESSMENT/PLAN: 23 y.o. G1P1001 PPD #1 s/p SVD    Post-partum Care: Ambulating, urinating, tolerating POs. Pain well-controlled  Wound care: Ice packs  Infant Care/Feeding: Breast feeding female infant "Molly"  Contraception: Will decide on contraception  Rh negative: Will receive Rhogam today  Rubella immune: MMR not indicated  Postpartum H/H: 12.6/36.7        Thornton Park, MD 04/21/2014, 06:44  [place] Dept of Anesthesiology  Resident, PGY-1   Pager [phone]              I saw and examined the patient.  I reviewed the resident's note.  I agree with the findings and plan of care as documented in the resident's note.  Any exceptions/additions are edited/noted.    Mickle Plumb, MD 04/21/2014, 10:51

## 2014-04-21 NOTE — Care Plan (Signed)
Problem: Postpartum, Vaginal Delivery (Adult)  Prevent and manage potential problems including:1. pain2. infection3. hemorrhage4. situational response5. urinary retention6. venous thromboembolism  Goal: Signs and Symptoms of Listed Potential Problems Will be Absent or Manageable (Postpartum, Vaginal Delivery)  Signs and symptoms of listed potential problems will be absent or manageable by discharge/transition of care (reference Postpartum, Vaginal Delivery (Adult) CPG).  Outcome: Ongoing (see interventions/notes)

## 2014-04-21 NOTE — Care Plan (Signed)
Problem: General Plan of Care(Adult,OB)  Goal: Plan of Care Review(Adult,OB)  The patient and/or their representative will communicate an understanding of their plan of care   Outcome: Ongoing (see interventions/notes)  VSS. Increasing in diet and activity. Patient's fundus firm without massage with light-moderate lochia. Patient c/o pain relieved by rest, position, and PRN medications. Patient c/o sore nipples relieved by lanolin. Will continue patient plan of care.

## 2014-04-22 LAB — RHIG FOR TRANSFUSION: UNIT DIVISION: 0

## 2014-04-22 MED ADMIN — lactated Ringers intravenous solution: @ 07:00:00

## 2014-04-22 NOTE — Care Plan (Signed)
Problem: General Plan of Care(Adult,OB)  Goal: Individualization/Patient Specific Goal(Adult/OB)  Outcome: Ongoing (see interventions/notes)

## 2014-04-22 NOTE — Care Plan (Signed)
Problem: Postpartum, Vaginal Delivery (Adult)  Prevent and manage potential problems including:1. pain2. infection3. hemorrhage4. situational response5. urinary retention6. venous thromboembolism  Goal: Signs and Symptoms of Listed Potential Problems Will be Absent or Manageable (Postpartum, Vaginal Delivery)  Signs and symptoms of listed potential problems will be absent or manageable by discharge/transition of care (reference Postpartum, Vaginal Delivery (Adult) CPG).  Outcome: Ongoing (see interventions/notes)

## 2014-04-22 NOTE — Pharmacy (Signed)
[facility]  Discharge Pharmacy Service  Initial Note    Patient: [name],[name]  Date of Birth: 04/09/1991  MRN: [id number]  Room/Bed: 623/A  Service: OBSTETRICS    Date of Service: 04/22/2014    No Known Allergies    Reason for Visit:    Initiate Discharge Pharmacy Services:  Declined  Freedom of Choice Offerings: Accepted  Review of Patient's Medication List: Accepted    Conclusion:     Consulted patient about the Discharge Pharmacy Service and patient was not interested at this time.  If patient reconsiders service, please contact the discharge pharmacy at [phone] or fax prescription orders to [phone].     [place] Thedora Rings, CPHT 04/22/2014, 10:24

## 2014-04-22 NOTE — Care Plan (Signed)
Problem: General Plan of Care(Adult,OB)  Goal: Plan of Care Review(Adult,OB)  The patient and/or their representative will communicate an understanding of their plan of care   Outcome: Adequate for Discharge Date Met:  04/22/14  Discharge order received. VSS. Fundus firm without massage with light rubra lochia. Increasing in activity and diet.Patient c/o pain relieved by PRN medications. Discharge instructions, prescriptions, and follow-up appointments discussed. Time for questions given. Patient verbalizes understanding. Patient is being sent home with newborn and spouse.

## 2014-04-22 NOTE — Progress Notes (Addendum)
[place] Department of Obstetric & Gynecology      POST PARTUM PROGRESS NOTE    PATIENT: [name]  CHART NUMBER: [id number]  DATE OF SERVICE: 04/22/2014      SUBJECTIVE: Chinaza Rooke Dowdle is a 23 y.o. G1P1001 PPD #1 s/p SVD over vaginal laceration. Pt doing well with no complaints. Pain is well managed with medication. Lochia is minimal, without clot. Voiding and ambulating without difficulty. Tolerating regular diet without N/V. Denies CP, SOB, fever, chills or calf tenderness.     Infant: Female  Breast feeding     Contraception: undecided - would like to wait until 6 week ppv    Rh NEGATIVE - rhogam given 11/14  Rubella immune    OBJECTIVE:   Filed Vitals:    04/21/14 1606 04/21/14 2011 04/21/14 2012 04/22/14 0012   BP: 120/72 132/75  134/82   Pulse: 89 85  82   Temp: 36.7 C (98.1 F) 37.8 C (100 F) 37 C (98.6 F) 37.4 C (99.3 F)   Resp:  16  16     GENERAL: NAD  CV: RRR, no murmurs, rubs, gallops  Lungs: CTAB, no wheezes, crackles, rhonchi  ABD: Soft, non tender  FUNDUS:  Firm and below the level of the umbilicus   EXT: No calf tenderness, bilateral pedal pulses strong, no edema    Labs:  Results for orders placed or performed during the hospital encounter of 04/20/14 (from the past 24 hour(s))   H & H   Result Value Ref Range    HGB 12.6 11.2 - 15.2 g/dL    HCT 36.7 33.5 - 45.2 %   RHIG FOR TRANSFUSION   Result Value Ref Range    UNIT NUMBER [id number]     BLOOD COMPONENT TYPE Rh IMMUNE GLOBULIN     UNIT DIVISION 00     STATUS OF UNIT ISSUED     TRANSFUSION STATUS OK TO TRANSFUSE        ASSESSMENT/PLAN: 23 y.o. G1P1001 PPD #1 s/p SVD    1.Routine Postpartum Care   -Pain management   -Encourage ambulation  2. Rh negative/Rubella immune   -Rhogam given 11/14   -MMR not indicated  3. Breast feeding   4. Contraception   -undecided - would like to wait until 6 week ppv    Discharge home today.      Caesar Chestnut, MD  PGY-2  [facility]  Department of  Obstetrics & Gynecology    I saw and examined the patient.  I reviewed the resident's note.  I agree with the findings and plan of care as documented in the resident's note.  Any exceptions/additions are edited/noted.    Jake Michaelis, MD 04/22/2014, 08:05

## 2014-04-22 NOTE — Discharge Summary (Addendum)
DISCHARGE SUMMARY      PATIENT NAME:  [name],[name]  MRN:  [id number]  DOB:  04/09/1991    ADMISSION DATE:  04/20/2014  DISCHARGE DATE:  04/22/2014    ATTENDING PHYSICIAN: Francisca Decemberuppett, Courtney, MD  PRIMARY CARE PHYSICIAN: Darrel ReachLina Jacques, MD     DISCHARGE DIAGNOSIS:    SVD of term viable female infant   Rh negative s/p rhogam  Principle Problem: <principal problem not specified>  Active Hospital Problems    Diagnosis Date Noted    Active labor 04/20/2014      Resolved Hospital Problems    Diagnosis    No resolved problems to display.     Active Non-Hospital Problems    Diagnosis Date Noted    Rh negative state in antepartum period 10/28/2013    A.   Supervision of normal first pregnancy 10/18/2013    Pregnant 09/20/2013    Wart 07/03/2013    Abdominal pain 02/25/2011      No Known Allergies  DISCHARGE MEDICATIONS:     Current Discharge Medication List      START taking these medications.       Details    Ibuprofen 600 mg Tablet   Commonly known as:  MOTRIN    600 mg, Oral, EVERY 6 HOURS PRN   Qty:  60 Tab   Refills:  1         CONTINUE these medications - NO CHANGES were made during your visit.       Details    PNV Combo #19-Iron-Fol Ac-DHA 22-6-1-200 mg Capsule    1 Cap, Oral, DAILY   Qty:  30 Cap   Refills:  5           DISCHARGE INSTRUCTIONS:        Follow-up Information     Follow up with [facility] .    Specialty:  Database administratorb-Gyn    Contact information:    [address]  [place] [id number]  [phone]          DISCHARGE INSTRUCTION - MISC   Call [phone] and ask for the [facility] on call for any of the following symptoms:    --Fever of 100.2 or higher for 2 or more hours  --Red, warm painful area in either breast  --Urgency, frequency, burning, pain on urination or urinating small amounts  --Warm, tender area on either leg  --Purulent discharge from incision or vagina  --Feelings of depression, irritability, or anxiety  --Purulent discharge from incision or vagina     SCHEDULE  FOLLOW-UP [facility] - OB[facility]N   Follow-up in: 6 WEEKS    Reason for visit: HOSPITAL DISCHARGE    Followup reason: postpartum visit    Provider: CNM         REASON FOR HOSPITALIZATION AND HOSPITAL COURSE:  This is a 23 y.o., female who presented to labor and delivery at 2860w2d with complaints of contractions. She was found to be 7-8 cm on arrival and was admitted for active labor.  The patient underwent a normal SVD of a female infant weighing 2580 g with apgars of 7 and 9. She had a vaginal laceration that was repaired.    Her postpartum course was uncomplicated. The patient was voiding, ambulating, and tolerating PO without difficulty. She was given rhogam for Rh negative status and her postpartum H/H was 12.6/36.7. She was discharged to home in stable condition on postpartum day #2 and will follow up in 6 weeks for postpartum visit  with CNM group.    CONDITION ON DISCHARGE:  A. Ambulation: Full ambulation  B. Self-care Ability: Complete  C. Cognitive Status Alert and Oriented x 3      DISCHARGE DISPOSITION:  Home discharge                Jones BroomErica Arthurs, MD        Copies sent to Care Team       Relationship Specialty Notifications Start End    Darrel ReachJacques, Lina, MD PCP - General GENERAL  05/18/13     Phone: [phone] Fax: [phone]         [address] [facility] [place] [phone]          Referring providers can utilize [email or web address] to access their referred [facility] patient's information.    I saw and examined the patient.  I reviewed the resident's note.  I agree with the findings and plan of care as documented in the resident's note.  Any exceptions/additions are edited/noted.    Jeanmarie HubertGary Savino Whisenant, MD 04/22/2014, 08:05

## 2014-04-22 NOTE — Discharge Instructions (Signed)
O.B. INSTRUCTIONS       Call [phone] and ask for the OB physician on call for any of the following symptoms:     Fever of 100.2 or higher for 2 or more hours.   Red, warm, painful area in either breast.   Urgency, frequency, burning, pain on urination or urinating small amounts.    Warm, tender area on either leg.   Purulent discharge from incision or vagina.   Feelings of depression, irritability, or anxiety.

## 2014-04-22 NOTE — Care Plan (Signed)
Problem: General Plan of Care(Adult,OB)  Goal: Plan of Care Review(Adult,OB)  The patient and/or their representative will communicate an understanding of their plan of care   Outcome: Ongoing (see interventions/notes)  Pt VS stable during shift. Pain well controlled with Motrin and Norco. Fundus firm, bleeding light. Breast feeding independently q2-3 hrs. All questions and concerns answered. Will continue to monitor.

## 2014-06-05 ENCOUNTER — Ambulatory Visit: Payer: 59 | Attending: OBSTETRICS/GYNECOLOGY | Admitting: Advanced Practice Midwife

## 2014-06-05 ENCOUNTER — Encounter (HOSPITAL_BASED_OUTPATIENT_CLINIC_OR_DEPARTMENT_OTHER): Payer: Self-pay | Admitting: Advanced Practice Midwife

## 2014-06-05 MED ORDER — MEDROXYPROGESTERONE 150 MG/ML INTRAMUSCULAR SUSPENSION
150.00 mg | INTRAMUSCULAR | Status: DC
Start: 2014-06-05 — End: 2014-09-04

## 2014-06-05 NOTE — Progress Notes (Addendum)
Patient seen by Katie SensingLena [name] CNM  Last H&H:  H&H  Lab Results   Component Value Date/Time    HGB 12.6 04/21/2014 05:48 AM    HCT 36.7 04/21/2014 05:48 AM       Current Outpatient Prescriptions   Medication Sig    Ibuprofen (MOTRIN) 600 mg Oral Tablet Take 1 Tab (600 mg total) by mouth Every 6 hours as needed    PNV Combo #19-Iron-Fol Ac-DHA 22-6-1-200 mg Oral Capsule Take 1 Cap by mouth Once a day     NSVD 04/20/14 with Katie MonsEsta [name] CNM    [name]: Katie BoysMolly   Weight now: unsure, definitely growing   Sleeping? Wakes up once per night  Feeding well? Yes, gassy, but nurses well  Adjusting to demands of motherhood: Yes  Family support:mostly from FOB  Planned return to work/school: 06/18/14  Childcare: will work alternate schedule with FOB  Sexual activity: No  Dyspareunia: n/a  Bladder complaints: no UTI s/s; no incontinence of urine    Bowel complaints: no constipation, no diarrhea  Lochial flow: yes  Describe:  Still spotting a few days ago    Exam:   Thyroid: soft, no thyromegaly, NT   Heart: RRR without murmur   Lungs: CTA bilaterally   Breasts full, no masses, nipples intact and everted (breastfeeding)               Abdomen no organomegaly, NT               Pelvis                           VBUS neg                                                    Episiotomy / lacerations healed                                                    Vagina  Moderate tone                                                    Cervix neg CMT                                                                      Uterus AV, NT                                                    Adnexa nonpalp bilaterally, NT  Recto vaginal WNL    Assessment:  Normal : yes                    Abnormal (clarify) no    Plan:      Pap done: No due 05/2015 with cotesting per protocol                Laboratory Tests: No                 Contraception: Depo ERx to pharmacy -- will bring back tomorrow for  administration    Please administer Depo when patient brings in, then q 13 weeks thereafter    RTO one year annual, sooner prn.                 CNM saw patient independently.  Physician available by phone for consultation, collaboration, or referral as per CNM certification and licensure.    Katie SensingLena [name], CNM  Katie Babinskiharles Naelani Lafrance, MD  06/11/2014, 08:23

## 2014-06-05 NOTE — Progress Notes (Signed)
POST PARTUM VISIT    23 y.o.    Gravida:1    Para1       Living Children 1  Infant:Female   Weight: 5LBS 11OZ   Apgars: 1min 7    5mins 9  Delivery:  Date 11.13.15   Spontaneous  Pregnancy complicated by Infant Feeding: breast  Birth control: barrier  Menses:   Discharge date 11.15.15

## 2014-06-11 ENCOUNTER — Ambulatory Visit: Payer: BLUE CROSS/BLUE SHIELD | Attending: Advanced Practice Midwife

## 2014-06-11 DIAGNOSIS — Z3042 Encounter for surveillance of injectable contraceptive: Secondary | ICD-10-CM | POA: Insufficient documentation

## 2014-06-11 DIAGNOSIS — Z309 Encounter for contraceptive management, unspecified: Secondary | ICD-10-CM

## 2014-07-03 ENCOUNTER — Ambulatory Visit (HOSPITAL_BASED_OUTPATIENT_CLINIC_OR_DEPARTMENT_OTHER): Payer: BLUE CROSS/BLUE SHIELD | Admitting: Family

## 2014-07-11 ENCOUNTER — Ambulatory Visit (HOSPITAL_BASED_OUTPATIENT_CLINIC_OR_DEPARTMENT_OTHER): Payer: BLUE CROSS/BLUE SHIELD | Admitting: Family

## 2014-07-20 ENCOUNTER — Encounter (HOSPITAL_BASED_OUTPATIENT_CLINIC_OR_DEPARTMENT_OTHER): Payer: Self-pay | Admitting: Family Medicine

## 2014-07-20 ENCOUNTER — Ambulatory Visit: Payer: BLUE CROSS/BLUE SHIELD | Attending: Family Medicine | Admitting: Family Medicine

## 2014-07-20 VITALS — BP 125/63 | HR 94 | Temp 98.5°F | Ht 67.0 in | Wt 170.0 lb

## 2014-07-20 DIAGNOSIS — K649 Unspecified hemorrhoids: Principal | ICD-10-CM | POA: Insufficient documentation

## 2014-07-20 MED ORDER — HYDROCORTISONE 2.5 % TOPICAL CREAM
TOPICAL_CREAM | Freq: Two times a day (BID) | CUTANEOUS | Status: DC
Start: 2014-07-20 — End: 2019-05-29

## 2014-07-20 NOTE — Patient Instructions (Signed)
ansol - hydrocortisone cream twice a day  - inbetween   - witch hazel pads  - wipes  - vaseline plain    - drink lots of water  - careful with fiber- goal is daily soft bowel movement  - aerobic activity is fine - careful with weight lifting- squats    If not getting better in 1-2 weeks then message me and i can place a referral   - in the short term- if creams are not working for pain relief--> message me and can try lidcaine

## 2014-07-20 NOTE — Progress Notes (Signed)
Nursing notes reviewed    SUBJECTIVE:     [name] is a 24 y.o. female here for   Chief Complaint   Patient presents with    Establish Care     new     here for evaluation of possible hemorrhoid.  She just had a baby on April 20, 2014.  It was her first baby.  She is breast feeding.  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  It hurts to have a bowel movement.  She is still breastfeeding and so would like to avoid taking any medications if she possibly can.  She has never had any problems with hemorrhoids in the past.  She does have a soft daily bowel movement.  She does not have to strain to have a bowel movement.  She has not noticed any blood in her stool, and no black stools.  Has tried treatment with some tea and drinking water but that has not really helped.  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          Past Medical History:    Patient Active Problem List   Diagnosis    Abdominal pain    Wart    Pregnant    Active labor      Current Outpatient Prescriptions   Medication Sig    hydrocortisone 2.5 % Cream Apply topically Twice daily    medroxyPROGESTERone (DEPO-PROVERA) 150 mg/mL Intramuscular Suspension 1 mL (150 mg total) by Intramuscular route Every 3 months    PNV Combo #19-Iron-Fol Ac-DHA 22-6-1-200 mg Oral Capsule Take 1 Cap by mouth Once a day     No Known Allergies      OBJECTIVE:   Blood pressure 125/63, pulse 94, temperature 36.9 C (98.5 F), height 1.702 m (5\' 7" ), weight 77.1 kg (169 lb 15.6 oz), unknown if currently breastfeeding.  General: Well appearing NAD  Rectal exam ; shows three tender  but non thrombosed external hemorrhoids with shallow ulcers on them      ASSESSMENT/PLAN: Katie HaSarah Melissa Wain is a 24 y.o. female here for     for evaluation of hemorrhoids.  On exam, does have nonthrombosed external hemorrhoids, about 3 of them, one at 12 o'clock, 6 o'clock and then at 3 o'clock.  The hemorrhoid areas also do seem somewhat excoriated.  Discussed treatment with just trying to keep the area clean with witch hazel pads and will also prescribe her hydrocortisone cream to try to shrink down the hemorrhoids and avoiding constipation.  Discussed treating with Anusol cream for about 2 weeks.  If it does not improve, she will let me know, send me a message, and we can refer her over for hemorrhoid excision to one of the general surgeon or colorectal surgeons.  Discussed that if her pain is quite intense, we could also try treating her with topical lidocaine until things heal up.  She will follow up as needed.          ICD-10-CM    1. Hemorrhoids K64.9 hydrocortisone 2.5 % Cream  Waldon Merl, MD  [facility]  Operated by [facility]  [address]  [place] [address]  Dept: [phone]

## 2014-08-07 ENCOUNTER — Encounter (HOSPITAL_BASED_OUTPATIENT_CLINIC_OR_DEPARTMENT_OTHER): Payer: Self-pay | Admitting: Advanced Practice Midwife

## 2014-08-07 ENCOUNTER — Other Ambulatory Visit (HOSPITAL_BASED_OUTPATIENT_CLINIC_OR_DEPARTMENT_OTHER): Payer: Self-pay | Admitting: Advanced Practice Midwife

## 2014-08-07 NOTE — Telephone Encounter (Signed)
From: Katie HaSarah Melissa Giza  To: [name], [name], CNM  Sent: 08/07/2014 12:25 PM EST  Subject: Medication Renewal Request    Original authorizing provider: Tona SensingLena Cerbone, CNM    [name] would like a refill of the following medications:  medroxyPROGESTERone (DEPO-PROVERA) 150 mg/mL Intramuscular Suspension Katie Sensing[[name] Cerbone, CNM]    Preferred pharmacy: [facility] - [place] - [address]    Comment:

## 2014-08-28 ENCOUNTER — Ambulatory Visit (HOSPITAL_BASED_OUTPATIENT_CLINIC_OR_DEPARTMENT_OTHER): Payer: BLUE CROSS/BLUE SHIELD

## 2014-08-30 ENCOUNTER — Ambulatory Visit (HOSPITAL_BASED_OUTPATIENT_CLINIC_OR_DEPARTMENT_OTHER): Payer: BLUE CROSS/BLUE SHIELD

## 2014-08-31 ENCOUNTER — Encounter

## 2014-09-03 ENCOUNTER — Encounter (HOSPITAL_BASED_OUTPATIENT_CLINIC_OR_DEPARTMENT_OTHER): Payer: Self-pay | Admitting: Family Medicine

## 2014-09-04 ENCOUNTER — Other Ambulatory Visit (HOSPITAL_BASED_OUTPATIENT_CLINIC_OR_DEPARTMENT_OTHER): Payer: Self-pay | Admitting: Advanced Practice Midwife

## 2014-09-04 MED ORDER — NORETHINDRONE (CONTRACEPTIVE) 0.35 MG TABLET
1.0000 | ORAL_TABLET | Freq: Every day | ORAL | Status: DC
Start: 2014-09-04 — End: 2015-10-01

## 2014-09-04 MED ORDER — NORETHINDRONE (CONTRACEPTIVE) 0.35 MG TABLET
1.0000 | ORAL_TABLET | Freq: Every day | ORAL | Status: DC
Start: 2014-09-04 — End: 2014-09-04

## 2015-04-21 ENCOUNTER — Other Ambulatory Visit: Payer: Self-pay

## 2015-06-28 ENCOUNTER — Encounter (HOSPITAL_BASED_OUTPATIENT_CLINIC_OR_DEPARTMENT_OTHER): Payer: Self-pay | Admitting: Advanced Practice Midwife

## 2015-07-03 ENCOUNTER — Encounter (HOSPITAL_BASED_OUTPATIENT_CLINIC_OR_DEPARTMENT_OTHER): Payer: Self-pay

## 2015-10-01 ENCOUNTER — Other Ambulatory Visit (HOSPITAL_BASED_OUTPATIENT_CLINIC_OR_DEPARTMENT_OTHER): Payer: Self-pay | Admitting: Advanced Practice Midwife

## 2015-10-02 MED ORDER — NORETHINDRONE (CONTRACEPTIVE) 0.35 MG TABLET
1.0000 | ORAL_TABLET | Freq: Every day | ORAL | 0 refills | Status: DC
Start: 2015-10-02 — End: 2019-05-29

## 2016-04-25 ENCOUNTER — Other Ambulatory Visit: Payer: Self-pay

## 2016-07-22 ENCOUNTER — Ambulatory Visit (INDEPENDENT_AMBULATORY_CARE_PROVIDER_SITE_OTHER): Payer: BLUE CROSS/BLUE SHIELD | Admitting: Physician Assistant

## 2016-07-22 ENCOUNTER — Encounter: Payer: Self-pay | Admitting: Physician Assistant

## 2016-07-22 VITALS — BP 130/82 | HR 109 | Temp 97.9°F | Resp 18 | Ht 65.75 in | Wt 189.0 lb

## 2016-07-22 DIAGNOSIS — Z30013 Encounter for initial prescription of injectable contraceptive: Secondary | ICD-10-CM | POA: Diagnosis not present

## 2016-07-22 DIAGNOSIS — Z23 Encounter for immunization: Secondary | ICD-10-CM

## 2016-07-22 DIAGNOSIS — Z0000 Encounter for general adult medical examination without abnormal findings: Secondary | ICD-10-CM

## 2016-07-22 NOTE — Progress Notes (Addendum)
Patient ID: Lindsey Velasquez MRN: [id number], DOB: [birth year], 26 y.o. Date of Encounter: 07/22/2016,   Chief Complaint: Physical (CPE)  HPI: 26 y.o. y/o female  here for CPE.   She is also being seen as a new patient to establish care.  She has one child -- a girl who is 2 years 2 months. She states that her last doctor's visit was in follow-up after having her baby. Says that they moved here shortly after that and she has had no doctor since she moved here. She, her husband, and her daughter moved here from [place], [place].  She states that she has no known past medical history of any chronic medical problems.  Reports that she has recently noticed that at times she will feel like her heart rate slows down for just a second and then returns to normal. States that she has had no associated lightheadedness or shortness of breath. Only feels it for just a second and then it resolves. Does drink some caffeine.  Also is interested in getting on Depo-Provera. Says that she used this for a while in the past. Is concerned that she will have skipped doses if she uses birth control pills and is not interested in IUD and other contraception options.  No other concerns, issues to address today.  Review of Systems: Consitutional: No fever, chills, fatigue, night sweats, lymphadenopathy. No significant/unexplained weight changes. Eyes: No visual changes, eye redness, or discharge. ENT/Mouth: No ear pain, sore throat, nasal drainage, or sinus pain. Cardiovascular: No chest pressure,heaviness, tightness or squeezing, even with exertion. No increased shortness of breath or dyspnea on exertion.No palpitations, edema, orthopnea, PND. Respiratory: No cough, hemoptysis, SOB, or wheezing. Gastrointestinal: No anorexia, dysphagia, reflux, pain, nausea, vomiting, hematemesis, diarrhea, constipation, BRBPR, or melena. Breast: No mass, nodules, bulging, or retraction. No skin changes or  inflammation. No nipple discharge. No lymphadenopathy. Genitourinary: No dysuria, hematuria, incontinence, vaginal discharge, pruritis, burning, abnormal bleeding, or pain. Musculoskeletal: No decreased ROM, No joint pain or swelling. No significant pain in neck, back, or extremities. Skin: No rash, pruritis, or concerning lesions. Neurological: No headache, dizziness, syncope, seizures, tremors, memory loss, coordination problems, or paresthesias. Psychological: No anxiety, depression, hallucinations, SI/HI. Endocrine: No polydipsia, polyphagia, polyuria, or known diabetes.No increased fatigue. No palpitations/rapid heart rate. No significant/unexplained weight change. All other systems were reviewed and are otherwise negative.  History reviewed. No pertinent past medical history.   History reviewed. No pertinent surgical history.  Home Meds:  No outpatient prescriptions prior to visit.   No facility-administered medications prior to visit.     Allergies: No Known Allergies  Social History   Social History  . Marital status: Married    Spouse name: N/A  . Number of children: N/A  . Years of education: N/A   Occupational History  . Not on file.   Social History Main Topics  . Smoking status: Former Games developer  . Smokeless tobacco: Never Used  . Alcohol use 0.6 oz/week    1 Glasses of wine per week     Comment: once a month  . Drug use: No  . Sexual activity: Yes    Birth control/ protection: None   Other Topics Concern  . Not on file   Social History Narrative  . No narrative on file    Family History  Problem Relation Age of Onset  . Arthritis Mother   . Depression Mother   . Hypertension Mother   . Miscarriages / Stillbirths Sister   .  Asthma Brother     Physical Exam: Blood pressure 130/82, pulse (!) 109, temperature 97.9 F (36.6 C), temperature source Oral, resp. rate 18, height 5' 5.75" (1.67 m), weight 189 lb (85.7 kg), last menstrual period 06/22/2016,  SpO2 98 %., Body mass index is 30.74 kg/m. General: Well developed, well nourished WF. Appears in no acute distress. HEENT: Normocephalic, atraumatic. Conjunctiva pink, sclera non-icteric. Pupils 2 mm constricting to 1 mm, round, regular, and equally reactive to light and accomodation. EOMI. Internal auditory canal clear. TMs with good cone of light and without pathology. Nasal mucosa pink. Nares are without discharge. No sinus tenderness. Oral mucosa pink.  Pharynx without exudate.   Neck: Supple. Trachea midline. No thyromegaly. Full ROM. No lymphadenopathy.No Carotid Bruits. Lungs: Clear to auscultation bilaterally without wheezes, rales, or rhonchi. Breathing is of normal effort and unlabored. Cardiovascular: RRR with S1 S2. No murmurs, rubs, or gallops. Distal pulses 2+ symmetrically. No carotid or abdominal bruits. Breast: Symmetrical. No masses. Nipples without discharge. Abdomen: Soft, non-tender, non-distended with normoactive bowel sounds. No hepatosplenomegaly or masses. No rebound/guarding. No CVA tenderness. No hernias.  Genitourinary:  External genitalia without lesions. Vaginal mucosa pink.No discharge present. Cervix pink and without discharge. No cervical tenderness.Normal uterus size. No adnexal mass or tenderness.  Pap smear taken. Musculoskeletal: Full range of motion and 5/5 strength throughout.  Skin: Warm and moist without erythema, ecchymosis, wounds, or rash. Neuro: A+Ox3. CN II-XII grossly intact. Moves all extremities spontaneously. Full sensation throughout. Normal gait. DTR 2+ throughout upper and lower extremities. Psych:  Responds to questions appropriately with a normal affect.   Assessment/Plan:  26 y.o. y/o female here for CPE  1. Encounter for preventive health examination  A. Screening Labs: She is not fasting today but is interested in checking screening labs and is agreeable to return tomorrow morning fasting for labs. - CBC with Differential/Platelet;  Future - COMPLETE METABOLIC PANEL WITH GFR; Future - Lipid panel; Future - TSH; Future - VITAMIN D 25 Hydroxy (Vit-D Deficiency, Fractures); Future   B. Pap: - PAP, Thin Prep w/HPV rflx HPV Type 16/18  F. Immunizations:  Influenza: She has not had flu vaccine for this season and is agreeable to get this today. Tetanus: T dap was updated with her pregnancy. Pneumococcal: She has no indication to require a pneumonia vaccine until 65. Zostavax: Not indicated until age 27.  2. Encounter for initial prescription of injectable contraceptive Discussed with her the process of checking a serum hCG and if this is negative-- then I will send prescription for Depo-Provera and she will go to the pharmacy to pick up med and will bring it here for that injection within 24 hours. She voices understanding and agrees and states that she will be able to do this given her schedule/location of house and work. - hCG, serum, qualitative; Future  3. Palpitations Her history is consistent with PVC. Discussed going ahead and getting monitor but she is agreeable to first decrease caffeine intake and then if symptoms persist or worsen, she will call and at that point I will order event monitor.   Murray HodgkinsSigned, Mackensey Bolte Beth BobtownDixon, [place]PA, [facility] 07/22/2016 3:14 PM

## 2016-07-23 ENCOUNTER — Other Ambulatory Visit: Payer: BLUE CROSS/BLUE SHIELD

## 2016-07-23 NOTE — Addendum Note (Signed)
Addended by: Phineas SemenJOHNSON, TIFFANY A on: 07/23/2016 11:24 AM   Modules accepted: Orders

## 2016-07-24 ENCOUNTER — Other Ambulatory Visit: Payer: BLUE CROSS/BLUE SHIELD

## 2016-07-24 LAB — PAP, THIN PREP W/HPV RFLX HPV TYPE 16/18: HPV DNA HIGH RISK: NOT DETECTED

## 2016-07-27 ENCOUNTER — Other Ambulatory Visit: Payer: Self-pay | Admitting: Physician Assistant

## 2016-07-27 ENCOUNTER — Other Ambulatory Visit: Payer: BLUE CROSS/BLUE SHIELD

## 2016-07-27 DIAGNOSIS — Z30013 Encounter for initial prescription of injectable contraceptive: Secondary | ICD-10-CM

## 2016-07-27 DIAGNOSIS — Z0000 Encounter for general adult medical examination without abnormal findings: Secondary | ICD-10-CM

## 2016-07-27 LAB — CBC WITH DIFFERENTIAL/PLATELET
Basophils Absolute: 0 cells/uL (ref 0–200)
Basophils Relative: 0 %
Eosinophils Absolute: 174 cells/uL (ref 15–500)
Eosinophils Relative: 3 %
HCT: 41.4 % (ref 35.0–45.0)
Hemoglobin: 14.1 g/dL (ref 12.0–15.0)
Lymphocytes Relative: 40 %
Lymphs Abs: 2320 cells/uL (ref 850–3900)
MCH: 29.5 pg (ref 27.0–33.0)
MCHC: 34.1 g/dL (ref 32.0–36.0)
MCV: 86.6 fL (ref 80.0–100.0)
MPV: 10.6 fL (ref 7.5–12.5)
Monocytes Absolute: 522 cells/uL (ref 200–950)
Monocytes Relative: 9 %
Neutro Abs: 2784 cells/uL (ref 1500–7800)
Neutrophils Relative %: 48 %
Platelets: 278 10*3/uL (ref 140–400)
RBC: 4.78 MIL/uL (ref 3.80–5.10)
RDW: 13.1 % (ref 11.0–15.0)
WBC: 5.8 10*3/uL (ref 3.8–10.8)

## 2016-07-27 LAB — LIPID PANEL
CHOL/HDL RATIO: 3.5 ratio (ref <5.0)
CHOLESTEROL: 170 mg/dL (ref <200)
HDL: 48 mg/dL — AB (ref >50)
LDL Cholesterol: 105 mg/dL — ABNORMAL HIGH (ref <100)
Triglycerides: 86 mg/dL (ref <150)
VLDL: 17 mg/dL (ref <30)

## 2016-07-27 LAB — COMPLETE METABOLIC PANEL WITH GFR
ALT: 10 U/L (ref 6–29)
AST: 13 U/L (ref 10–30)
Albumin: 4.5 g/dL (ref 3.6–5.1)
Alkaline Phosphatase: 48 U/L (ref 33–115)
BUN: 8 mg/dL (ref 7–25)
CHLORIDE: 106 mmol/L (ref 98–110)
CO2: 25 mmol/L (ref 20–31)
CREATININE: 0.61 mg/dL (ref 0.50–1.10)
Calcium: 9.5 mg/dL (ref 8.6–10.2)
GFR, Est African American: >89 mL/min (ref >=60)
GFR, Est Non African American: >89 mL/min (ref >=60)
Glucose, Bld: 87 mg/dL (ref 70–99)
POTASSIUM: 4 mmol/L (ref 3.5–5.3)
SODIUM: 140 mmol/L (ref 135–146)
Total Bilirubin: 0.8 mg/dL (ref 0.2–1.2)
Total Protein: 6.9 g/dL (ref 6.1–8.1)

## 2016-07-27 LAB — TSH: TSH: 0.02 mIU/L — ABNORMAL LOW

## 2016-07-28 LAB — VITAMIN D 25 HYDROXY (VIT D DEFICIENCY, FRACTURES): Vit D, 25-Hydroxy: 31 ng/mL (ref 30–100)

## 2016-07-28 LAB — HCG, SERUM, QUALITATIVE: Preg, Serum: NEGATIVE

## 2016-07-29 ENCOUNTER — Other Ambulatory Visit: Payer: Self-pay

## 2016-07-29 LAB — T4, FREE: Free T4: 1.8 ng/dL (ref 0.8–1.8)

## 2016-07-29 MED ORDER — MEDROXYPROGESTERONE ACETATE 150 MG/ML IM SUSP: INTRAMUSCULAR | 12 refills | Status: DC

## 2016-08-03 ENCOUNTER — Telehealth: Payer: Self-pay

## 2016-08-03 NOTE — Telephone Encounter (Signed)
Mircette Take daily # 1 pack + 11 refills Tell her to make sure to take every day---about same time every day.

## 2016-08-03 NOTE — Telephone Encounter (Signed)
Spoke with pt regarding lab results and she stated due to her schedule she would like to switch from the birth control injection to a  pill.

## 2016-08-05 MED ORDER — DESOGESTREL-ETHINYL ESTRADIOL 0.15-0.02/0.01 MG (21/5) PO TABS: 1.0000 | ORAL_TABLET | Freq: Every day | ORAL | 11 refills | Status: DC

## 2016-08-05 NOTE — Telephone Encounter (Signed)
Rx sent patient is aware 

## 2017-02-01 ENCOUNTER — Ambulatory Visit: Payer: Self-pay | Admitting: Physician Assistant

## 2017-04-01 ENCOUNTER — Ambulatory Visit: Payer: BLUE CROSS/BLUE SHIELD | Admitting: Physician Assistant

## 2017-04-05 ENCOUNTER — Ambulatory Visit (INDEPENDENT_AMBULATORY_CARE_PROVIDER_SITE_OTHER): Payer: Managed Care, Other (non HMO) | Admitting: Physician Assistant

## 2017-04-05 ENCOUNTER — Encounter: Payer: Self-pay | Admitting: Physician Assistant

## 2017-04-05 VITALS — BP 130/82 | HR 131 | Temp 98.2°F | Resp 18 | Ht 66.0 in | Wt 164.8 lb

## 2017-04-05 DIAGNOSIS — Z23 Encounter for immunization: Secondary | ICD-10-CM

## 2017-04-05 DIAGNOSIS — R002 Palpitations: Secondary | ICD-10-CM

## 2017-04-05 DIAGNOSIS — E0590 Thyrotoxicosis, unspecified without thyrotoxic crisis or storm: Secondary | ICD-10-CM | POA: Diagnosis not present

## 2017-04-05 DIAGNOSIS — R000 Tachycardia, unspecified: Secondary | ICD-10-CM | POA: Diagnosis not present

## 2017-04-05 LAB — T4, FREE: FREE T4: 7.1 ng/dL — AB (ref 0.8–1.8)

## 2017-04-05 LAB — TSH

## 2017-04-05 NOTE — Progress Notes (Signed)
Patient ID: Lindsey CavaSarah Velasquez MRN: [id number], DOB: 09-25-1990, 26 y.o. Date of Encounter: 04/05/2017,   Chief Complaint: Physical (CPE)  HPI: 26 y.o. y/o female      07/2016: here for CPE.   She is also being seen as a new patient to establish care.  She has one child -- a girl who is 2 years 2 months. She states that her last doctor's visit was in follow-up after having her baby. Says that they moved here shortly after that and she has had no doctor since she moved here. She, her husband, and her daughter [place] here [place].  She states that she has no known past medical history of any chronic medical problems.  Reports that she has recently noticed that at times she will feel like her heart rate slows down for just a second and then returns to normal. States that she has had no associated lightheadedness or shortness of breath. Only feels it for just a second and then it resolves. Does drink some caffeine.  Also is interested in getting on Depo-Provera. Says that she used this for a while in the past. Is concerned that she will have skipped doses if she uses birth control pills and is not interested in IUD and other contraception options.  No other concerns, issues to address today.  AT THAT OV: Pap smear was normal. CBC, CMET, FLP, vitamin D were all normal. TSH was 0.02 Free T4 was 1.8, which is upper end of normal.   04/05/2017: Today she reports that she decided not to do the Depo-Provera and decided that she just doesn't want any type of birth control in her body. Reviewed her labs from last visit and discussed rechecking her labs for thyroid and she is agreeable. Today she states that she worked with chloraform in the past. 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 Says that she is just concerned that the chloraform is causing her palpitations. Says that now she feels the palpitations only occasionally  and not as often and she was in the past. Reports that her current job requires hepatitis B vaccine. She needs to get hep B vaccine and also flu vaccine. Her daughter is almost 463 years old. She plans to home school.     Review of Systems: Consitutional: No fever, chills, fatigue, night sweats, lymphadenopathy. No significant/unexplained weight changes. Eyes: No visual changes, eye redness, or discharge. ENT/Mouth: No ear pain, sore throat, nasal drainage, or sinus pain. Cardiovascular: No chest pressure,heaviness, tightness or squeezing, even with exertion. No increased shortness of breath or dyspnea on exertion.No palpitations, edema, orthopnea, PND. Respiratory: No cough, hemoptysis, SOB, or wheezing. Gastrointestinal: No anorexia, dysphagia, reflux, pain, nausea, vomiting, hematemesis, diarrhea, constipation, BRBPR, or melena. Breast: No mass, nodules, bulging, or retraction. No skin changes or inflammation. No nipple discharge. No lymphadenopathy. Genitourinary: No dysuria, hematuria, incontinence, vaginal discharge, pruritis, burning, abnormal bleeding, or pain. Musculoskeletal: No decreased ROM, No joint pain or swelling. No significant pain in neck, back, or extremities. Skin: No rash, pruritis, or concerning lesions. Neurological: No headache, dizziness, syncope, seizures, tremors, memory loss, coordination problems, or paresthesias. Psychological: No anxiety, depression, hallucinations, SI/HI. Endocrine: No polydipsia, polyphagia, polyuria, or known diabetes.No increased fatigue. No palpitations/rapid heart rate. No significant/unexplained weight change. All other systems were reviewed and are otherwise negative.  No past medical history on file.   No past surgical history on file.  Home Meds:  Outpatient  Medications Prior to Visit  Medication Sig Dispense Refill  . Multiple Vitamin (MULTIVITAMIN) tablet Take 1 tablet by mouth daily.    Marland Kitchen desogestrel-ethinyl estradiol  (MIRCETTE) 0.15-0.02/0.01 MG (21/5) tablet Take 1 tablet by mouth daily. 1 Package 11  . medroxyPROGESTERone (DEPO-PROVERA) 150 MG/ML injection Inject 150mg /ml   every 13 weeks 1 mL 12   No facility-administered medications prior to visit.     Allergies: No Known Allergies  Social History   Social History  . Marital status: Married    Spouse name: N/A  . Number of children: N/A  . Years of education: N/A   Occupational History  . Not on file.   Social History Main Topics  . Smoking status: Former Games developer  . Smokeless tobacco: Never Used  . Alcohol use 0.6 oz/week    1 Glasses of wine per week     Comment: once a month  . Drug use: No  . Sexual activity: Yes    Birth control/ protection: None   Other Topics Concern  . Not on file   Social History Narrative  . No narrative on file    Family History  Problem Relation Age of Onset  . Arthritis Mother   . Depression Mother   . Hypertension Mother   . Miscarriages / Stillbirths Sister   . Asthma Brother     Physical Exam: Blood pressure 130/82, pulse (!) 131, temperature 98.2 F (36.8 C), temperature source Oral, resp. rate 18, height 5\' 6"  (1.676 m), weight 74.8 kg (164 lb 12.8 oz), last menstrual period 02/20/2017, SpO2 98 %, unknown if currently breastfeeding., Body mass index is 26.6 kg/m. General: Well developed, well nourished WF. Appears in no acute distress. Neck: Supple. Trachea midline. No thyromegaly. Full ROM. No lymphadenopathy. Lungs: Clear to auscultation bilaterally without wheezes, rales, or rhonchi. Breathing is of normal effort and unlabored. Cardiovascular: RRR with S1 S2. No murmurs, rubs, or gallops. Distal pulses 2+ symmetrically. No carotid or abdominal bruits. Abdomen: Soft, non-tender, non-distended with normoactive bowel sounds. No hepatosplenomegaly or masses. No rebound/guarding. No CVA tenderness. No hernias.  Musculoskeletal: Full range of motion and 5/5 strength throughout.  Skin: Warm  and moist without erythema, ecchymosis, wounds, or rash. Neuro: A+Ox3. CN II-XII grossly intact. Moves all extremities spontaneously. Full sensation throughout. Normal gait.  Psych:  Responds to questions appropriately with a normal affect.   Assessment/Plan:  26 y.o. y/o female here for    1. Subclinical hyperthyroidism 04/05/2017: Recheck labs to monitor - T4, free - TSH  2. Palpitations It is possible that she is developing hyperthyroidism and this is causing tachycardia.  Will check labs and if consistent with hyperthyroidism then will refer to endocrinology and consider canceling referral to cardiology. - EKG 12-Lead - T4, free - TSH - Ambulatory referral to Cardiology  Will give flu vaccine today.  She wants to make sure about Insurance coverage first--then--Will start hep B series.   Signed, [address] [place], [place], [facility] 04/05/2017 9:20 AM

## 2017-04-05 NOTE — Addendum Note (Signed)
Addended by: Phineas SemenJOHNSON, TIFFANY A on: 04/05/2017 09:53 AM   Modules accepted: Orders

## 2017-04-06 ENCOUNTER — Other Ambulatory Visit: Payer: Self-pay

## 2017-04-06 DIAGNOSIS — E0590 Thyrotoxicosis, unspecified without thyrotoxic crisis or storm: Secondary | ICD-10-CM

## 2017-04-06 MED ORDER — METOPROLOL TARTRATE 25 MG PO TABS: 25.0000 mg | ORAL_TABLET | Freq: Two times a day (BID) | ORAL | 1 refills | Status: DC

## 2017-04-07 ENCOUNTER — Encounter: Payer: Self-pay | Admitting: Physician Assistant

## 2017-04-13 ENCOUNTER — Ambulatory Visit (INDEPENDENT_AMBULATORY_CARE_PROVIDER_SITE_OTHER): Payer: Managed Care, Other (non HMO)

## 2017-04-13 DIAGNOSIS — Z23 Encounter for immunization: Secondary | ICD-10-CM

## 2017-04-13 DIAGNOSIS — Z9229 Personal history of other drug therapy: Secondary | ICD-10-CM

## 2017-04-13 NOTE — Progress Notes (Signed)
Patient was in office today to receive the tdap and HepB vaccine.Patient received the vaccine in her left deltoid Patient tolerated well

## 2017-04-15 ENCOUNTER — Encounter: Payer: Self-pay | Admitting: "Endocrinology

## 2017-04-15 ENCOUNTER — Ambulatory Visit: Payer: Managed Care, Other (non HMO) | Admitting: "Endocrinology

## 2017-04-15 VITALS — BP 133/80 | HR 130 | Ht 66.0 in | Wt 165.0 lb

## 2017-04-15 DIAGNOSIS — E0590 Thyrotoxicosis, unspecified without thyrotoxic crisis or storm: Secondary | ICD-10-CM

## 2017-04-15 MED ORDER — PROPRANOLOL HCL 20 MG PO TABS: 20.0000 mg | ORAL_TABLET | Freq: Three times a day (TID) | ORAL | 0 refills | Status: DC

## 2017-04-15 NOTE — Progress Notes (Signed)
Consult Note    Subjective:    Patient ID: Lindsey Velasquez, female    DOB: 03-25-1991, PCP Dorena Bodoixon, Mary B, PA-C.   History reviewed. No pertinent past medical history. History reviewed. No pertinent surgical history. Social History   Socioeconomic History  . Marital status: Married    Spouse name: None  . Number of children: None  . Years of education: None  . Highest education level: None  Social Needs  . Financial resource strain: None  . Food insecurity - worry: None  . Food insecurity - inability: None  . Transportation needs - medical: None  . Transportation needs - non-medical: None  Occupational History  . None  Tobacco Use  . Smoking status: Former Games developermoker  . Smokeless tobacco: Never Used  Substance and Sexual Activity  . Alcohol use: Yes    Alcohol/week: 0.6 oz    Types: 1 Glasses of wine per week    Comment: once a month  . Drug use: No  . Sexual activity: Yes    Birth control/protection: None  Other Topics Concern  . None  Social History Narrative  . None   Outpatient Encounter Medications as of 04/15/2017  Medication Sig  . metoprolol tartrate (LOPRESSOR) 25 MG tablet Take 1 tablet (25 mg total) by mouth 2 (two) times daily. (Patient not taking: Reported on 04/15/2017)  . Multiple Vitamin (MULTIVITAMIN) tablet Take 1 tablet by mouth daily.  . propranolol (INDERAL) 20 MG tablet Take 1 tablet (20 mg total) 3 (three) times daily by mouth.   No facility-administered encounter medications on file as of 04/15/2017.     ALLERGIES: No Known Allergies  VACCINATION STATUS: Immunization History  Administered Date(s) Administered  . Hep A / Hep B 04/13/2017  . Influenza,inj,Quad PF,6+ Mos 07/22/2016, 04/05/2017  . Tdap 04/13/2017     HPI  Lindsey Velasquez is 26 y.o. female who presents today with a medical history as above. she is being seen in consultation for hyperthyroidism requested by Dorena Bodoixon, Mary B, PA-C.  she has been dealing with  symptoms of  heat intolerance/sweating, tremors, palpitations, and anxiety and sleep disturbance for several weeks. - She insists that she has gained weight, however her records show that she has lost 23 pounds since February 2018.  she denies dysphagia, choking, shortness of breath, no recent voice change. These symptoms are progressively worsening and troubling to her, she went to see her PCP in October 2018 where she was found to have significantly elevated free T4 along with significantly suppressed TSH.    her most recent thyroid labs revealed  Lab Results  Component Value Date   TSH <0.01 (L) 04/05/2017   TSH 0.02 (L) 07/27/2016   FREET4 7.1 (H) 04/05/2017   FREET4 1.8 07/27/2016     she denies family history of thyroid dysfunction. denies family hx of thyroid cancer. she denies personal history of goiter. she is not on any anti-thyroid medications nor on any thyroid hormone supplements. she  is willing to proceed with appropriate work up and therapy for thyrotoxicosis.  Review of systems:  Constitutional: + weight loss, + fatigue, + subjective hyperthermia Eyes: no blurry vision, + xerophthalmia ENT: no sore throat, no nodules palpated in throat, no dysphagia/odynophagia, nor hoarseness Cardiovascular: no Chest Pain, no Shortness of Breath, ++  palpitations, no leg swelling Respiratory: no cough, no SOB Gastrointestinal: no Nausea, no Vomiting, no Diarhhea Musculoskeletal: no muscle/joint aches Skin: no rashes Neurological: ++  tremors, no numbness, no tingling, no dizziness Psychiatric: no  depression, ++  anxiety   Objective:    BP 133/80   Pulse (!) 130   Ht 5\' 6"  (1.676 m)   Wt 165 lb (74.8 kg)   BMI 26.63 kg/m   Wt Readings from Last 3 Encounters:  04/15/17 165 lb (74.8 kg)  04/05/17 164 lb 12.8 oz (74.8 kg)  07/22/16 189 lb (85.7 kg)    Physical exam Constitutional: ++ Slightly over weight for height, not in acute distress, + stable state of mind Eyes: PERRLA,  EOMI, ++ exophthalmos with lid lag ENT: moist mucous membranes, ++  Thyromegaly with bruit, no cervical lymphadenopathy Cardiovascular: +++ precordial activity, ++ tachycardia at 130, regular  Rhythm, no Murmur/Rubs/Gallops Respiratory:  adequate breathing efforts, no gross chest deformity, Clear to auscultation bilaterally Gastrointestinal: abdomen soft, Non -tender, No distension, Bowel Sounds present Musculoskeletal: no gross deformities, strength intact in all four extremities Skin: moist, warm, no rashes Neurological: ++  tremor with outstretched hands,  ++ Deep Tendon Reflexes  on both lower extremities.   CMP     Component Value Date/Time   NA 140 07/27/2016 1210   K 4.0 07/27/2016 1210   CL 106 07/27/2016 1210   CO2 25 07/27/2016 1210   GLUCOSE 87 07/27/2016 1210   BUN 8 07/27/2016 1210   CREATININE 0.61 07/27/2016 1210   CALCIUM 9.5 07/27/2016 1210   PROT 6.9 07/27/2016 1210   ALBUMIN 4.5 07/27/2016 1210   AST 13 07/27/2016 1210   ALT 10 07/27/2016 1210   ALKPHOS 48 07/27/2016 1210   BILITOT 0.8 07/27/2016 1210   GFRNONAA >89 07/27/2016 1210   GFRAA >89 07/27/2016 1210     CBC    Component Value Date/Time   WBC 5.8 07/27/2016 1210   RBC 4.78 07/27/2016 1210   HGB 14.1 07/27/2016 1210   HCT 41.4 07/27/2016 1210   PLT 278 07/27/2016 1210   MCV 86.6 07/27/2016 1210   MCH 29.5 07/27/2016 1210   MCHC 34.1 07/27/2016 1210   RDW 13.1 07/27/2016 1210   LYMPHSABS 2,320 07/27/2016 1210   MONOABS 522 07/27/2016 1210   EOSABS 174 07/27/2016 1210   BASOSABS 0 07/27/2016 1210     Diabetic Labs (most recent): No results found for: HGBA1C  Lipid Panel     Component Value Date/Time   CHOL 170 07/27/2016 1210   TRIG 86 07/27/2016 1210   HDL 48 (L) 07/27/2016 1210   CHOLHDL 3.5 07/27/2016 1210   VLDL 17 07/27/2016 1210   LDLCALC 105 (H) 07/27/2016 1210     Lab Results  Component Value Date   TSH <0.01 (L) 04/05/2017   TSH 0.02 (L) 07/27/2016   FREET4 7.1  (H) 04/05/2017   FREET4 1.8 07/27/2016      Assessment & Plan:   1. Hyperthyroidism  she is being seen at a kind request of Dorena BodoDixon, Mary B, PA-C. her history and most recent labs are reviewed, and she was examined clinically. Subjective and objective findings are consistent with thyrotoxicosis likely from primary hyperthyroidism. The potential risks of untreated thyrotoxicosis including thyroid storm and the need for definitive therapy have been discussed in detail with her, and she agrees to proceed with diagnostic workup and treatment plan.   I like to obtain confirmatory thyroid uptake and scan will be scheduled to be done as soon as possible.   Options of therapy are discussed with her. Definitive therapy may involve RAI ablation of the thyroid, with subsequent need for lifelong thyroid hormone replacement. she is made aware of  this outcome  and she is  willing to proceed. she will return in 1 week for treatment decision.   I will propranolol 20 mg by mouth 3 times a day a temporary prescription for  symptomatic relief.  - Time spent with the patient:45 minutes, of which >50% was spent in obtaining information about her symptoms, reviewing her current and previous labs, evaluations, and treatments, counseling her about hyperthyroidism and its complications , and developing a plan for long term treatment; she had a number of questions which I addressed.  - I advised her to maintain close follow up with Dorena Bodo, PA-C for primary care needs.  Follow up plan: Return in about 1 week (around 04/22/2017) for follow up with thyroid uptake and scan.   Thank you for involving me in the care of this pleasant patient, and I will continue to update you with her progress.  Marquis Lunch, MD [facility] Phone: [phone]  Fax: [phone]   04/15/2017, 10:12 AM  This note was partially dictated with voice recognition software. Similar  sounding words can be transcribed inadequately or may not  be corrected upon review.

## 2017-04-19 ENCOUNTER — Ambulatory Visit (HOSPITAL_COMMUNITY)
Admission: RE | Admit: 2017-04-19 | Discharge: 2017-04-19 | Disposition: A | Discharge status: Home / Self Care | Payer: 59 | Source: Ambulatory Visit | Attending: "Endocrinology | Admitting: "Endocrinology

## 2017-04-19 ENCOUNTER — Telehealth: Payer: Self-pay | Admitting: "Endocrinology

## 2017-04-19 ENCOUNTER — Encounter (HOSPITAL_COMMUNITY): Payer: Self-pay

## 2017-04-19 DIAGNOSIS — E0590 Thyrotoxicosis, unspecified without thyrotoxic crisis or storm: Secondary | ICD-10-CM | POA: Insufficient documentation

## 2017-04-19 LAB — POCT PREGNANCY, URINE: Preg Test, Ur: NEGATIVE

## 2017-04-19 MED ORDER — SODIUM IODIDE I-123 7.4 MBQ CAPS
400.0000 | ORAL_CAPSULE | Freq: Once | ORAL | Status: AC
  Administered 2017-04-19: 356 via ORAL

## 2017-04-19 NOTE — Telephone Encounter (Signed)
Lindsey SagoSarah is calling asking for a returned call, she has questions about the uptake & scan she has scheduled, please advise?

## 2017-04-19 NOTE — Telephone Encounter (Signed)
Returned pts call. She has spoke with [facility] as well today.

## 2017-04-20 ENCOUNTER — Ambulatory Visit (HOSPITAL_COMMUNITY)
Admission: RE | Admit: 2017-04-20 | Discharge: 2017-04-20 | Disposition: A | Discharge status: Home / Self Care | Payer: 59 | Source: Ambulatory Visit | Attending: "Endocrinology | Admitting: "Endocrinology

## 2017-04-22 ENCOUNTER — Encounter: Payer: Self-pay | Admitting: "Endocrinology

## 2017-04-22 ENCOUNTER — Ambulatory Visit (INDEPENDENT_AMBULATORY_CARE_PROVIDER_SITE_OTHER): Payer: Managed Care, Other (non HMO) | Admitting: "Endocrinology

## 2017-04-22 VITALS — BP 131/82 | HR 96 | Ht 66.0 in | Wt 166.0 lb

## 2017-04-22 DIAGNOSIS — E0590 Thyrotoxicosis, unspecified without thyrotoxic crisis or storm: Secondary | ICD-10-CM | POA: Diagnosis not present

## 2017-04-22 DIAGNOSIS — E0500 Thyrotoxicosis with diffuse goiter without thyrotoxic crisis or storm: Secondary | ICD-10-CM | POA: Diagnosis not present

## 2017-04-22 NOTE — Progress Notes (Signed)
Consult Note    Subjective:    Patient ID: Lindsey Velasquez, female    DOB: January 14, 1991, PCP Dorena Bodo, PA-C.   History reviewed. No pertinent past medical history. History reviewed. No pertinent surgical history. Social History   Socioeconomic History  . Marital status: Married    Spouse name: None  . Number of children: None  . Years of education: None  . Highest education level: None  Social Needs  . Financial resource strain: None  . Food insecurity - worry: None  . Food insecurity - inability: None  . Transportation needs - medical: None  . Transportation needs - non-medical: None  Occupational History  . None  Tobacco Use  . Smoking status: Former Games developer  . Smokeless tobacco: Never Used  Substance and Sexual Activity  . Alcohol use: Yes    Alcohol/week: 0.6 oz    Types: 1 Glasses of wine per week    Comment: once a month  . Drug use: No  . Sexual activity: Yes    Birth control/protection: None  Other Topics Concern  . None  Social History Narrative  . None   Outpatient Encounter Medications as of 04/15/2017  Medication Sig  . metoprolol tartrate (LOPRESSOR) 25 MG tablet Take 1 tablet (25 mg total) by mouth 2 (two) times daily. (Patient not taking: Reported on 04/15/2017)  . Multiple Vitamin (MULTIVITAMIN) tablet Take 1 tablet by mouth daily.  . propranolol (INDERAL) 20 MG tablet Take 1 tablet (20 mg total) 3 (three) times daily by mouth.   No facility-administered encounter medications on file as of 04/15/2017.     ALLERGIES: No Known Allergies  VACCINATION STATUS: Immunization History  Administered Date(s) Administered  . Hep A / Hep B 04/13/2017  . Influenza,inj,Quad PF,6+ Mos 07/22/2016, 04/05/2017  . Tdap 04/13/2017     HPI  Lindsey Velasquez is 26 y.o. female who presents today with a medical history as above.   - She is accompanied by her husband [name]. She underwent thyroid uptake and scan after her last visit confirming  elevated uniform uptake of 76% confirming Graves' disease as a cause of her primary hyperparathyroidism.   she has been dealing with symptoms of  heat intolerance/sweating, tremors, palpitations, and anxiety and sleep disturbance for several weeks. - She insists that she has gained weight, however her records show that she has lost 23 pounds since February 2018.   she denies dysphagia, choking, shortness of breath, no recent voice change. These symptoms are progressively worsening and troubling to her, she went to see her PCP in October 2018 where she was found to have significantly elevated free T4 along with significantly suppressed TSH.  - The propranolol prescribed with her last visit helping with the palpitations.  her most recent thyroid labs revealed  Lab Results  Component Value Date   TSH <0.01 (L) 04/05/2017   TSH 0.02 (L) 07/27/2016   FREET4 7.1 (H) 04/05/2017   FREET4 1.8 07/27/2016     she denies family history of thyroid dysfunction. denies family hx of thyroid cancer. she denies personal history of goiter. she is not on any anti-thyroid medications nor on any thyroid hormone supplements. she  is willing to proceed with appropriate work up and therapy for thyrotoxicosis.  Review of systems:  Constitutional: + weight loss, + fatigue, + subjective hyperthermia Eyes: no blurry vision, + xerophthalmia ENT: no sore throat, no nodules palpated in throat, no dysphagia/odynophagia, nor hoarseness Cardiovascular: no Chest Pain, no Shortness of Breath, ++  palpitations, no leg swelling Respiratory: no cough, no SOB Gastrointestinal: no Nausea, no Vomiting, no Diarhhea Musculoskeletal: no muscle/joint aches Skin: no rashes Neurological: ++  tremors, no numbness, no tingling, no dizziness Psychiatric: no depression, ++  anxiety   Objective:    BP 133/80   Pulse (!) 130   Ht 5\' 6"  (1.676 m)   Wt 165 lb (74.8 kg)   BMI 26.63 kg/m   Wt Readings from Last 3 Encounters:   04/15/17 165 lb (74.8 kg)  04/05/17 164 lb 12.8 oz (74.8 kg)  07/22/16 189 lb (85.7 kg)    Physical exam Constitutional: ++ Slightly over weight for height, not in acute distress, + stable state of mind Eyes: PERRLA, EOMI, ++ exophthalmos with lid lag ENT: moist mucous membranes, ++  Thyromegaly with bruit, no cervical lymphadenopathy Cardiovascular: +++ precordial activity, ++ tachycardia at 130, regular  Rhythm, no Murmur/Rubs/Gallops Respiratory:  adequate breathing efforts, no gross chest deformity, Clear to auscultation bilaterally Gastrointestinal: abdomen soft, Non -tender, No distension, Bowel Sounds present Musculoskeletal: no gross deformities, strength intact in all four extremities Skin: moist, warm, no rashes Neurological: ++  tremor with outstretched hands,  ++ Deep Tendon Reflexes  on both lower extremities.   CMP     Component Value Date/Time   NA 140 07/27/2016 1210   K 4.0 07/27/2016 1210   CL 106 07/27/2016 1210   CO2 25 07/27/2016 1210   GLUCOSE 87 07/27/2016 1210   BUN 8 07/27/2016 1210   CREATININE 0.61 07/27/2016 1210   CALCIUM 9.5 07/27/2016 1210   PROT 6.9 07/27/2016 1210   ALBUMIN 4.5 07/27/2016 1210   AST 13 07/27/2016 1210   ALT 10 07/27/2016 1210   ALKPHOS 48 07/27/2016 1210   BILITOT 0.8 07/27/2016 1210   GFRNONAA >89 07/27/2016 1210   GFRAA >89 07/27/2016 1210     CBC    Component Value Date/Time   WBC 5.8 07/27/2016 1210   RBC 4.78 07/27/2016 1210   HGB 14.1 07/27/2016 1210   HCT 41.4 07/27/2016 1210   PLT 278 07/27/2016 1210   MCV 86.6 07/27/2016 1210   MCH 29.5 07/27/2016 1210   MCHC 34.1 07/27/2016 1210   RDW 13.1 07/27/2016 1210   LYMPHSABS 2,320 07/27/2016 1210   MONOABS 522 07/27/2016 1210   EOSABS 174 07/27/2016 1210   BASOSABS 0 07/27/2016 1210     Diabetic Labs (most recent): No results found for: HGBA1C  Lipid Panel     Component Value Date/Time   CHOL 170 07/27/2016 1210   TRIG 86 07/27/2016 1210   HDL 48  (L) 07/27/2016 1210   CHOLHDL 3.5 07/27/2016 1210   VLDL 17 07/27/2016 1210   LDLCALC 105 (H) 07/27/2016 1210     Lab Results  Component Value Date   TSH <0.01 (L) 04/05/2017   TSH 0.02 (L) 07/27/2016   FREET4 7.1 (H) 04/05/2017   FREET4 1.8 07/27/2016      Assessment & Plan:   1. Hyperthyroidism 2. Graves' disease  Subjective and objective findings are consistent with thyrotoxicosis likely from Graves' disease. The potential risks of untreated thyrotoxicosis and the need for definitive therapy have been discussed in detail with her and her husband in the exam room. And she agrees to proceed with treatment plan.   Options of therapy are discussed with with her, and she agrees with a suggestion of choice for definitive therapy with RAI ablation of the thyroid, with subsequent need for lifelong thyroid hormone replacement. She is made aware  of this outcome  and she is  willing to proceed. - This treatment would be scheduled to be administered as soon as possible. - She will return in 9 weeks with repeat thyroid function test. - I advised her to continue propranolol 20 mg by mouth 3 times a day for symptomatic relief.  - I advised her to maintain close follow up with Dorena Bodoixon, Mary B, PA-C for primary care needs.  Follow up plan: 10 weeks with TSH and free T4.  Marquis LunchGebre Schuyler Olden, MD [facility] Phone: [phone]  Fax: [phone]   04/15/2017, 10:12 AM  This note was partially dictated with voice recognition software. Similar sounding words can be transcribed inadequately or may not  be corrected upon review.

## 2017-05-03 ENCOUNTER — Other Ambulatory Visit: Payer: Self-pay | Admitting: "Endocrinology

## 2017-05-07 ENCOUNTER — Ambulatory Visit (HOSPITAL_COMMUNITY): Payer: 59

## 2017-05-10 ENCOUNTER — Encounter (HOSPITAL_COMMUNITY)
Admission: RE | Admit: 2017-05-10 | Discharge: 2017-05-10 | Disposition: A | Discharge status: Home / Self Care | Payer: 59 | Source: Ambulatory Visit | Attending: "Endocrinology | Admitting: "Endocrinology

## 2017-05-10 ENCOUNTER — Encounter (HOSPITAL_COMMUNITY): Payer: Self-pay

## 2017-05-10 DIAGNOSIS — E0590 Thyrotoxicosis, unspecified without thyrotoxic crisis or storm: Secondary | ICD-10-CM | POA: Insufficient documentation

## 2017-05-10 LAB — I-STAT BETA HCG BLOOD, ED (NOT ORDERABLE)

## 2017-05-10 MED ORDER — SODIUM IODIDE I 131 CAPSULE
12.0000 | Freq: Once | INTRAVENOUS | Status: AC | PRN
  Administered 2017-05-10: 12.65 via ORAL

## 2017-05-26 ENCOUNTER — Other Ambulatory Visit: Payer: Self-pay

## 2017-05-26 MED ORDER — PROPRANOLOL HCL 20 MG PO TABS: 20.0000 mg | ORAL_TABLET | Freq: Three times a day (TID) | ORAL | 0 refills | Status: DC

## 2017-05-27 ENCOUNTER — Telehealth: Payer: Self-pay | Admitting: "Endocrinology

## 2017-05-27 NOTE — Telephone Encounter (Signed)
Lindsey Velasquez is calling asking if she can cut back on the dose of her propranolol (INDERAL) 20 MG tablet since shes had her ablation, please advise?

## 2017-05-27 NOTE — Telephone Encounter (Signed)
She can take it once a day if her Heart rate  is lower than 80

## 2017-05-27 NOTE — Telephone Encounter (Signed)
Pt.notified

## 2017-06-14 ENCOUNTER — Ambulatory Visit (INDEPENDENT_AMBULATORY_CARE_PROVIDER_SITE_OTHER): Payer: Managed Care, Other (non HMO)

## 2017-06-14 DIAGNOSIS — Z23 Encounter for immunization: Secondary | ICD-10-CM | POA: Diagnosis not present

## 2017-06-14 NOTE — Progress Notes (Signed)
Patient was in office for Hep A/B vaccine. Patient received vaccine in her left deltoid.patietn tolerated well.

## 2017-07-01 ENCOUNTER — Ambulatory Visit: Payer: Managed Care, Other (non HMO) | Admitting: "Endocrinology

## 2017-07-05 ENCOUNTER — Other Ambulatory Visit: Payer: Self-pay | Admitting: "Endocrinology

## 2017-07-06 LAB — TSH

## 2017-07-06 LAB — T4, FREE: FREE T4: 4.88 ng/dL — AB (ref 0.82–1.77)

## 2017-07-13 ENCOUNTER — Telehealth: Payer: Self-pay | Admitting: "Endocrinology

## 2017-07-13 NOTE — Telephone Encounter (Signed)
Pt notified to stay off of med until her next visit

## 2017-07-13 NOTE — Telephone Encounter (Signed)
We have to see her back to decide. She may or may not need it. When is she coming back?

## 2017-07-13 NOTE — Telephone Encounter (Signed)
Lindsey SagoSarah is asking if she needs to continue taking the propranolol (INDERAL) 20 MG tablet after her ablation, if so she needs a refill, please advise?

## 2017-07-19 ENCOUNTER — Encounter: Payer: Self-pay | Admitting: "Endocrinology

## 2017-07-19 ENCOUNTER — Ambulatory Visit: Payer: Managed Care, Other (non HMO) | Admitting: "Endocrinology

## 2017-07-19 VITALS — BP 133/86 | HR 97 | Ht 66.0 in | Wt 170.0 lb

## 2017-07-19 DIAGNOSIS — E0590 Thyrotoxicosis, unspecified without thyrotoxic crisis or storm: Secondary | ICD-10-CM

## 2017-07-19 DIAGNOSIS — E0500 Thyrotoxicosis with diffuse goiter without thyrotoxic crisis or storm: Secondary | ICD-10-CM | POA: Diagnosis not present

## 2017-07-19 MED ORDER — PROPRANOLOL HCL 20 MG PO TABS: 20.0000 mg | ORAL_TABLET | Freq: Every day | ORAL | 2 refills | Status: DC

## 2017-07-19 NOTE — Progress Notes (Signed)
Consult Note    Subjective:    Patient ID: Lindsey Velasquez, female    DOB: [birth year], PCP Dorena Bodo, PA-C.   Social History   Socioeconomic History  . Marital status: Married    Spouse name: None  . Number of children: None  . Years of education: None  . Highest education level: None  Social Needs  . Financial resource strain: None  . Food insecurity - worry: None  . Food insecurity - inability: None  . Transportation needs - medical: None  . Transportation needs - non-medical: None  Occupational History  . None  Tobacco Use  . Smoking status: Former Games developer  . Smokeless tobacco: Never Used  Substance and Sexual Activity  . Alcohol use: Yes    Alcohol/week: 0.6 oz    Types: 1 Glasses of wine per week    Comment: once a month  . Drug use: No  . Sexual activity: Yes    Birth control/protection: None  Other Topics Concern  . None  Social History Narrative  . None   Outpatient Encounter Medications as of 04/15/2017  Medication Sig  . metoprolol tartrate (LOPRESSOR) 25 MG tablet Take 1 tablet (25 mg total) by mouth 2 (two) times daily. (Patient not taking: Reported on 04/15/2017)  . Multiple Vitamin (MULTIVITAMIN) tablet Take 1 tablet by mouth daily.  . propranolol (INDERAL) 20 MG tablet Take 1 tablet (20 mg total) 3 (three) times daily by mouth.   No facility-administered encounter medications on file as of 04/15/2017.     ALLERGIES: No Known Allergies  VACCINATION STATUS: Immunization History  Administered Date(s) Administered  . Hep A / Hep B 04/13/2017  . Influenza,inj,Quad PF,6+ Mos 07/22/2016, 04/05/2017  . Tdap 04/13/2017     HPI  Lindsey Velasquez is 27 y.o. female who presents today with a medical history as above.  -She is status post RAI therapy for Graves' disease on May 10, 2017. - she underwent thyroid function tests on July 05, 2017.  Thyroid uptake at 24 hours before her I-131 therapy was 76%.  She reports  symptomatic improvement in her palpitations, heat intolerance, tremors.  -She is regaining her lost weight. -She is not taking her propranolol.  her most recent thyroid labs revealed  Lab Results  Component Value Date   TSH <0.01 (L) 04/05/2017   TSH 0.02 (L) 07/27/2016   FREET4 7.1 (H) 04/05/2017   FREET4 1.8 07/27/2016     she denies family history of thyroid dysfunction. denies family hx of thyroid cancer. she denies personal history of goiter. she is not on any anti-thyroid medications nor on any thyroid hormone supplements.   Review of systems:  Constitutional: + Weight gain, + fatigue, - subjective hyperthermia Eyes: no blurry vision, + xerophthalmia ENT: no sore throat, no nodules palpated in throat, no dysphagia/odynophagia, nor hoarseness Cardiovascular: no Chest Pain, no Shortness of Breath, ++  palpitations, no leg swelling Respiratory: no  Cough, no shortness of breath.   Gastrointestinal: no Nausea, no Vomiting, no Diarhhea Musculoskeletal: no muscle/joint aches Skin: no rashes Neurological: + tremors, no numbness, no tingling, no dizziness Psychiatric: no depression, +  anxiety   Objective:    BP 133/80   Pulse (!) 130   Ht 5\' 6"  (1.676 m)   Wt 165 lb (74.8 kg)   BMI 26.63 kg/m   Wt Readings from Last 3 Encounters:  04/15/17 165 lb (74.8 kg)  04/05/17 164 lb 12.8 oz (74.8 kg)  07/22/16 189 lb (85.7 kg)  Physical exam Constitutional: + Slightly over weight for height, not in acute distress, + stable state of mind Eyes: PERRLA, EOMI, + exophthalmos with lid lag (improving) ENT: moist mucous membranes, +  Thyromegaly with bruit (decreasing size of thyroid), no cervical lymphadenopathy Cardiovascular: + precordial activity, pulse rate of 97 improving from  130, regular  Rhythm, no Murmur/Rubs/Gallops Respiratory:  adequate breathing efforts, no gross chest deformity, Clear to auscultation bilaterally Gastrointestinal: abdomen soft, Non -tender, No  distension, Bowel Sounds present Musculoskeletal: no gross deformities, strength intact in all four extremities Skin: moist, warm, no rashes Neurological: + tremor with outstretched hands,  + Deep Tendon Reflexes  on both lower extremities.   CMP     Component Value Date/Time   NA 140 07/27/2016 1210   K 4.0 07/27/2016 1210   CL 106 07/27/2016 1210   CO2 25 07/27/2016 1210   GLUCOSE 87 07/27/2016 1210   BUN 8 07/27/2016 1210   CREATININE 0.61 07/27/2016 1210   CALCIUM 9.5 07/27/2016 1210   PROT 6.9 07/27/2016 1210   ALBUMIN 4.5 07/27/2016 1210   AST 13 07/27/2016 1210   ALT 10 07/27/2016 1210   ALKPHOS 48 07/27/2016 1210   BILITOT 0.8 07/27/2016 1210   GFRNONAA >89 07/27/2016 1210   GFRAA >89 07/27/2016 1210     CBC    Component Value Date/Time   WBC 5.8 07/27/2016 1210   RBC 4.78 07/27/2016 1210   HGB 14.1 07/27/2016 1210   HCT 41.4 07/27/2016 1210   PLT 278 07/27/2016 1210   MCV 86.6 07/27/2016 1210   MCH 29.5 07/27/2016 1210   MCHC 34.1 07/27/2016 1210   RDW 13.1 07/27/2016 1210   LYMPHSABS 2,320 07/27/2016 1210   MONOABS 522 07/27/2016 1210   EOSABS 174 07/27/2016 1210   BASOSABS 0 07/27/2016 1210     Diabetic Labs (most recent): No results found for: HGBA1C  Lipid Panel     Component Value Date/Time   CHOL 170 07/27/2016 1210   TRIG 86 07/27/2016 1210   HDL 48 (L) 07/27/2016 1210   CHOLHDL 3.5 07/27/2016 1210   VLDL 17 07/27/2016 1210   LDLCALC 105 (H) 07/27/2016 1210   Results for Valma CavaLEXANDROPOULOS, Lindsey (MRN [id number]) as of 07/19/2017 16:58  Ref. Range 07/27/2016 12:10 04/05/2017 09:40 07/05/2017 14:49  TSH Latest Ref Range: 0.450 - 4.500 uIU/mL 0.02 (L) <0.01 (L) <0.006 (L)  T4,Free(Direct) Latest Ref Range: 0.82 - 1.77 ng/dL 1.8 7.1 (H) 4.094.88 (H)    Assessment & Plan:   1. Hyperthyroidism 2. Graves' disease  She is status post therapy with I-131 for hyperthyroidism from Graves' disease with clinical evidence of treatment effect.  Her thyroid  function tests are not in the hypothyroid range, not ready for initiation of thyroid hormone replacement. -I advised her to repeat thyroid function test in 6 weeks with office visit in 7 weeks. -She will continue to benefit from low-dose propanolol, 20 mg p.o. daily.  - I advised her to maintain close follow up with Dorena Bodoixon, Mary B, PA-C for primary care needs.  Follow up plan: 10 weeks with TSH and free T4.  Marquis LunchGebre Nikola Blackston, MD [facility] Phone: [phone]  Fax: [phone]   04/15/2017, 10:12 AM  This note was partially dictated with voice recognition software. Similar sounding words can be transcribed inadequately or may not  be corrected upon review.

## 2017-07-26 ENCOUNTER — Encounter: Payer: Self-pay | Admitting: "Endocrinology

## 2017-08-04 ENCOUNTER — Other Ambulatory Visit: Payer: Self-pay | Admitting: "Endocrinology

## 2017-08-05 LAB — T4, FREE: FREE T4: 0.4 ng/dL — AB (ref 0.82–1.77)

## 2017-08-05 LAB — TSH: TSH: 3.8 u[IU]/mL (ref 0.450–4.500)

## 2017-08-08 ENCOUNTER — Encounter: Payer: Self-pay | Admitting: Physician Assistant

## 2017-08-08 ENCOUNTER — Other Ambulatory Visit: Payer: Self-pay | Admitting: Physician Assistant

## 2017-08-09 ENCOUNTER — Other Ambulatory Visit: Payer: Self-pay | Admitting: Physician Assistant

## 2017-08-09 MED ORDER — AZURETTE 0.15-0.02/0.01 MG (21/5) PO TABS: 1.0000 | ORAL_TABLET | Freq: Every day | ORAL | 0 refills | Status: DC

## 2017-08-10 ENCOUNTER — Encounter: Payer: Self-pay | Admitting: "Endocrinology

## 2017-08-10 ENCOUNTER — Ambulatory Visit: Payer: Managed Care, Other (non HMO) | Admitting: "Endocrinology

## 2017-08-10 VITALS — BP 137/83 | HR 64 | Ht 66.0 in | Wt 178.0 lb

## 2017-08-10 DIAGNOSIS — E890 Postprocedural hypothyroidism: Secondary | ICD-10-CM | POA: Diagnosis not present

## 2017-08-10 MED ORDER — LEVOTHYROXINE SODIUM 88 MCG PO TABS: 88.0000 ug | ORAL_TABLET | Freq: Every day | ORAL | 2 refills | Status: DC

## 2017-08-10 NOTE — Progress Notes (Signed)
Consult Note    Subjective:    Patient ID: Lindsey Velasquez, female    DOB: 11-03-1990, PCP Dorena Bodoixon, Mary B, PA-C.   Social History   Socioeconomic History  . Marital status: Married    Spouse name: None  . Number of children: None  . Years of education: None  . Highest education level: None  Social Needs  . Financial resource strain: None  . Food insecurity - worry: None  . Food insecurity - inability: None  . Transportation needs - medical: None  . Transportation needs - non-medical: None  Occupational History  . None  Tobacco Use  . Smoking status: Former Games developermoker  . Smokeless tobacco: Never Used  Substance and Sexual Activity  . Alcohol use: Yes    Alcohol/week: 0.6 oz    Types: 1 Glasses of wine per week    Comment: once a month  . Drug use: No  . Sexual activity: Yes    Birth control/protection: None  Other Topics Concern  . None  Social History Narrative  . None   Outpatient Encounter Medications as of 04/15/2017  Medication Sig  . metoprolol tartrate (LOPRESSOR) 25 MG tablet Take 1 tablet (25 mg total) by mouth 2 (two) times daily. (Patient not taking: Reported on 04/15/2017)  . Multiple Vitamin (MULTIVITAMIN) tablet Take 1 tablet by mouth daily.  . propranolol (INDERAL) 20 MG tablet Take 1 tablet (20 mg total) 3 (three) times daily by mouth.   No facility-administered encounter medications on file as of 04/15/2017.     ALLERGIES: No Known Allergies  VACCINATION STATUS: Immunization History  Administered Date(s) Administered  . Hep A / Hep B 04/13/2017  . Influenza,inj,Quad PF,6+ Mos 07/22/2016, 04/05/2017  . Tdap 04/13/2017     HPI  Lindsey Velasquez is 27 y.o. female who presents today for follow-up of hypothyroidism.  -She is status post RAI therapy for Graves' disease on May 10, 2017. - she underwent thyroid function tests on July 05, 2017.  Thyroid uptake at 24 hours before her I-131 therapy was 76%.  She reports  symptomatic improvement in her palpitations, heat intolerance, tremors.  -She is regaining her lost weight. -She is on low-dose propranolol.  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  she denies family history of thyroid dysfunction. denies family hx of thyroid cancer. she denies personal history of goiter.    Review of systems:  Constitutional: + Weight gain, + fatigue, - subjective hyperthermia Eyes: no blurry vision, + xerophthalmia ENT: no sore throat, no nodules palpated in throat, no dysphagia/odynophagia, nor hoarseness Cardiovascular: no Chest Pain, no Shortness of Breath, ++  palpitations, no leg swelling Respiratory: no  Cough, no shortness of breath.   Gastrointestinal: no Nausea, no Vomiting, no Diarhhea Musculoskeletal: no muscle/joint aches Skin: no rashes Neurological: - tremors, no numbness, no tingling, no dizziness Psychiatric: no depression, -  anxiety   Objective:    BP 133/80   Pulse (!) 130   Ht 5\' 6"  (1.676 m)   Wt 165 lb (74.8 kg)   BMI 26.63 kg/m   Wt Readings from Last 3 Encounters:  04/15/17 165 lb (74.8 kg)  04/05/17 164 lb 12.8 oz (74.8 kg)  07/22/16 189 lb (85.7 kg)    Physical exam Constitutional: + Slightly over weight for height, not in acute distress, + stable state of mind Eyes: PERRLA, EOMI, + exophthalmos with lid lag (improving) ENT: moist mucous membranes, +  Thyromegaly with bruit (decreasing size of thyroid), no  cervical lymphadenopathy Cardiovascular: + precordial activity, pulse rate of 97 improving from  130, regular  Rhythm, no Murmur/Rubs/Gallops Respiratory:  adequate breathing efforts, no gross chest deformity, Clear to auscultation bilaterally Gastrointestinal: abdomen soft, Non -tender, No distension, Bowel Sounds present Musculoskeletal: no gross deformities, strength intact in all four extremities Skin: moist, warm, no rashes Neurological: - tremor with outstretched hands,  + Deep  Tendon Reflexes  on both lower extremities.   CMP     Component Value Date/Time   NA 140 07/27/2016 1210   K 4.0 07/27/2016 1210   CL 106 07/27/2016 1210   CO2 25 07/27/2016 1210   GLUCOSE 87 07/27/2016 1210   BUN 8 07/27/2016 1210   CREATININE 0.61 07/27/2016 1210   CALCIUM 9.5 07/27/2016 1210   PROT 6.9 07/27/2016 1210   ALBUMIN 4.5 07/27/2016 1210   AST 13 07/27/2016 1210   ALT 10 07/27/2016 1210   ALKPHOS 48 07/27/2016 1210   BILITOT 0.8 07/27/2016 1210   GFRNONAA >89 07/27/2016 1210   GFRAA >89 07/27/2016 1210     CBC    Component Value Date/Time   WBC 5.8 07/27/2016 1210   RBC 4.78 07/27/2016 1210   HGB 14.1 07/27/2016 1210   HCT 41.4 07/27/2016 1210   PLT 278 07/27/2016 1210   MCV 86.6 07/27/2016 1210   MCH 29.5 07/27/2016 1210   MCHC 34.1 07/27/2016 1210   RDW 13.1 07/27/2016 1210   LYMPHSABS 2,320 07/27/2016 1210   MONOABS 522 07/27/2016 1210   EOSABS 174 07/27/2016 1210   BASOSABS 0 07/27/2016 1210     Diabetic Labs (most recent): No results found for: HGBA1C  Lipid Panel     Component Value Date/Time   CHOL 170 07/27/2016 1210   TRIG 86 07/27/2016 1210   HDL 48 (L) 07/27/2016 1210   CHOLHDL 3.5 07/27/2016 1210   VLDL 17 07/27/2016 1210   LDLCALC 105 (H) 07/27/2016 1210   Results for MAKINA, SKOW (MRN [id number]) as of 08/10/2017 18:22  Ref. Range 07/05/2017 14:49 08/04/2017 14:57  TSH Latest Ref Range: 0.450 - 4.500 uIU/mL <0.006 (L) 3.800  T4,Free(Direct) Latest Ref Range: 0.82 - 1.77 ng/dL 4.09 (H) 8.11 (L)     Assessment & Plan:   1. RAI hypothyroidism 2. Graves' disease-resolved. -  She is status post therapy with I-131 for hyperthyroidism from Graves' disease with clinical evidence of treatment effect.  -Her thyroid function tests are indicative of treatment effect-area induced hypothyroidism.   -I discussed and initiated thyroid hormone replacement  for her.   -I will start with levothyroxine 88 mcg p.o. every morning.   She may need higher dose based on her subsequent response.   - We discussed about correct intake of levothyroxine, at fasting, with water, separated by at least 30 minutes from breakfast, and separated by more than 4 hours from calcium, iron, multivitamins, acid reflux medications (PPIs). -Patient is made aware of the fact that thyroid hormone replacement is needed for life, dose to be adjusted by periodic monitoring of thyroid function tests.    - I advised her to maintain close follow up with Dorena Bodo, PA-C for primary care needs.  Follow up plan: 10 weeks with TSH and free T4.  Marquis Lunch, MD [facility] Phone: [phone]  Fax: [phone]   04/15/2017, 10:12 AM  This note was partially dictated with voice recognition software. Similar sounding words can be transcribed inadequately or may not  be corrected upon review.

## 2017-08-24 ENCOUNTER — Encounter: Payer: Self-pay | Admitting: "Endocrinology

## 2017-08-25 ENCOUNTER — Other Ambulatory Visit: Payer: Self-pay | Admitting: "Endocrinology

## 2017-08-25 ENCOUNTER — Encounter: Payer: Self-pay | Admitting: Physician Assistant

## 2017-08-25 ENCOUNTER — Other Ambulatory Visit: Payer: Self-pay

## 2017-08-25 ENCOUNTER — Ambulatory Visit (INDEPENDENT_AMBULATORY_CARE_PROVIDER_SITE_OTHER): Payer: Managed Care, Other (non HMO) | Admitting: Physician Assistant

## 2017-08-25 VITALS — BP 126/84 | HR 95 | Temp 98.2°F | Resp 16 | Ht 67.0 in | Wt 181.6 lb

## 2017-08-25 DIAGNOSIS — Z3041 Encounter for surveillance of contraceptive pills: Secondary | ICD-10-CM

## 2017-08-25 DIAGNOSIS — B079 Viral wart, unspecified: Secondary | ICD-10-CM

## 2017-08-25 DIAGNOSIS — Z0000 Encounter for general adult medical examination without abnormal findings: Secondary | ICD-10-CM

## 2017-08-25 DIAGNOSIS — Z309 Encounter for contraceptive management, unspecified: Secondary | ICD-10-CM | POA: Insufficient documentation

## 2017-08-25 DIAGNOSIS — B078 Other viral warts: Secondary | ICD-10-CM | POA: Insufficient documentation

## 2017-08-25 MED ORDER — IMIQUIMOD 5 % EX CREA: TOPICAL_CREAM | CUTANEOUS | 1 refills | Status: DC

## 2017-08-25 NOTE — Progress Notes (Signed)
Patient ID: Lindsey Velasquez MRN: [id number], DOB: 01-Oct-1990, 27 y.o. Date of Encounter: 08/25/2017,   Chief Complaint: Physical (CPE)  HPI: 27 y.o. y/o female  here for CPE.     07/22/2016: She presents for CPE.  She is also being seen as a new patient to establish care.  She has one child -- a girl who is 2 years 2 months.  She states that her last doctor's visit was in follow-up after having her baby.  Says that they moved here shortly after that and she has had no doctor since she moved here.  She, her husband, and her daughter moved here from [place], [place].  She states that she has no known past medical history of any chronic medical problems.  Reports that she has recently noticed that at times she will feel like her heart rate slows down for just a second and then returns to normal.  States that she has had no associated lightheadedness or shortness of breath.  Only feels it for just a second and then it resolves.  Does drink some caffeine.  Also is interested in getting on Depo-Provera.  Says that she used this for a while in the past.  Is concerned that she will have skipped doses if she uses birth control pills and is not interested in IUD and other contraception options.  No other concerns, issues to address today.   A/P AT THAT VISIT INCLUDED Preventive Care and: At that visit she was not fasting but agreed to return fasting for labs. 2. Encounter for initial prescription of injectable contraceptive Discussed with her the process of checking a serum hCG and if this is negative-- then I will send prescription for Depo-Provera and she will go to the pharmacy to pick up med and will bring it here for that injection within 24 hours. She voices understanding and agrees and states that she will be able to do this given her schedule/location of house and work. - hCG, serum, qualitative; Future  3. Palpitations Her history is consistent with PVC.  Discussed going ahead and getting monitor but she is agreeable to first decrease caffeine intake and then if symptoms persist or worsen, she will call and at that point I will order event monitor.   08/25/2017:  Today I reviewed her chart. Pap smear performed 07/22/16 was--- negative cytology / negative HPV. Labs performed 07/27/16 showed pregnancy test negative so we then prescribed for Depo-Provera.  CBC was normal.  CMET was normal.  Lipid panel was normal with LDL 105.  TSH was borderline at 0.02.  Vitamin D normal at 31.  Free T4 at that time was 1.8 which is upper end of normal.  She subsequently had follow-up lab 04/05/17 which showed T4-- 7.1   and TSH <0.01. At that time she was referred to endocrinology for follow-up.  Since then, this has been managed by Dr. Fransico Him.  She reports that the Depo-Provera was changed to birth control pill.  She is taking the pill daily as directed. Reports that she has a spot on her left lower cheek/left chin that she thinks is a wart.  However has used the over-the-counter treatment and it is not resolved. She states that she works at [employer] and needs me to write an order for her to have labs checked by them.  Says that this is what Dr. Fransico Him has been doing and this is working well. Has no other concerns to address today. States that it has  been an adjustment for her body regarding the thyroid treatments that she has been able to tell a difference but otherwise feels that everything has been stable and has had no other concerns to address today.    Review of Systems: Consitutional: No fever, chills, fatigue, night sweats, lymphadenopathy. No significant/unexplained weight changes. Eyes: No visual changes, eye redness, or discharge. ENT/Mouth: No ear pain, sore throat, nasal drainage, or sinus pain. Cardiovascular: No chest pressure,heaviness, tightness or squeezing, even with exertion. No increased shortness of breath or dyspnea on exertion.No palpitations,  edema, orthopnea, PND. Respiratory: No cough, hemoptysis, SOB, or wheezing. Gastrointestinal: No anorexia, dysphagia, reflux, pain, nausea, vomiting, hematemesis, diarrhea, constipation, BRBPR, or melena. Breast: No mass, nodules, bulging, or retraction. No skin changes or inflammation. No nipple discharge. No lymphadenopathy. Genitourinary: No dysuria, hematuria, incontinence, vaginal discharge, pruritis, burning, abnormal bleeding, or pain. Musculoskeletal: No decreased ROM, No joint pain or swelling. No significant pain in neck, back, or extremities. Skin: No rash, pruritis, or concerning lesions. Neurological: No headache, dizziness, syncope, seizures, tremors, memory loss, coordination problems, or paresthesias. Psychological: No anxiety, depression, hallucinations, SI/HI. Endocrine: No polydipsia, polyphagia, polyuria, or known diabetes.No increased fatigue. No palpitations/rapid heart rate. No significant/unexplained weight change. All other systems were reviewed and are otherwise negative.  No past medical history on file.   No past surgical history on file.  Home Meds:  Outpatient Medications Prior to Visit  Medication Sig Dispense Refill  . AZURETTE 0.15-0.02/0.01 MG (21/5) tablet Take 1 tablet by mouth daily. 1 Package 0  . levothyroxine (SYNTHROID, LEVOTHROID) 88 MCG tablet Take 1 tablet (88 mcg total) by mouth daily. 30 tablet 2  . Multiple Vitamin (MULTIVITAMIN) tablet Take 1 tablet by mouth daily.     No facility-administered medications prior to visit.     Allergies: No Known Allergies  Social History   Socioeconomic History  . Marital status: Married    Spouse name: Not on file  . Number of children: Not on file  . Years of education: Not on file  . Highest education level: Not on file  Social Needs  . Financial resource strain: Not on file  . Food insecurity - worry: Not on file  . Food insecurity - inability: Not on file  . Transportation needs - medical:  Not on file  . Transportation needs - non-medical: Not on file  Occupational History  . Not on file  Tobacco Use  . Smoking status: Former Games developermoker  . Smokeless tobacco: Never Used  Substance and Sexual Activity  . Alcohol use: Yes    Alcohol/week: 0.6 oz    Types: 1 Glasses of wine per week    Comment: once a month  . Drug use: No  . Sexual activity: Yes    Birth control/protection: None  Other Topics Concern  . Not on file  Social History Narrative  . Not on file    Family History  Problem Relation Age of Onset  . Arthritis Mother   . Depression Mother   . Hypertension Mother   . Miscarriages / Stillbirths Sister   . Asthma Brother     Physical Exam: Blood pressure 126/84, pulse 95, temperature 98.2 F (36.8 C), temperature source Oral, resp. rate 16, height 5\' 7"  (1.702 m), weight 82.4 kg (181 lb 9.6 oz), last menstrual period 08/02/2017, SpO2 98 %, unknown if currently breastfeeding., Body mass index is 28.44 kg/m. General: Well developed, well nourished WF. Appears in no acute distress. HEENT: Normocephalic, atraumatic. Conjunctiva pink, sclera  non-icteric. Pupils 2 mm constricting to 1 mm, round, regular, and equally reactive to light and accomodation. EOMI. Internal auditory canal clear. TMs with good cone of light and without pathology. Nasal mucosa pink. Nares are without discharge. No sinus tenderness. Oral mucosa pink. Neck: Supple. Trachea midline. No thyromegaly. Full ROM. No lymphadenopathy.No Carotid Bruits. Lungs: Clear to auscultation bilaterally without wheezes, rales, or rhonchi. Breathing is of normal effort and unlabored. Cardiovascular: RRR with S1 S2. No murmurs, rubs, or gallops. Distal pulses 2+ symmetrically. No carotid or abdominal bruits. Breast: Symmetrical. No masses. Nipples without discharge. Abdomen: Soft, non-tender, non-distended with normoactive bowel sounds. No hepatosplenomegaly or masses. No rebound/guarding. No CVA tenderness. No hernias.    Genitourinary:  External genitalia without lesions. Vaginal mucosa pink.No discharge present. Cervix pink and without discharge. No cervical tenderness.Normal uterus size. No adnexal mass or tenderness.   Musculoskeletal: Full range of motion and 5/5 strength throughout.  Skin: Warm and moist without erythema, ecchymosis, wounds, or rash. Neuro: A+Ox3. CN II-XII grossly intact. Moves all extremities spontaneously. Full sensation throughout. Normal gait.   Psych:  Responds to questions appropriately with a normal affect.   Assessment/Plan:  27 y.o. y/o female here for CPE  1. Encounter for preventive health examination  A. Screening Labs: 08/25/2017: She is not fasting today but is interested in rechecking screening labs.  Reports that she works at [employer] and needs to have labs checked there.  Today I have given her an order and they are to fax results to me.  I will not recheck labs for thyroid as this is now managed by Dr. Fransico Him - CBC with Differential/Platelet; Future - COMPLETE METABOLIC PANEL WITH GFR; Future - Lipid panel; Future  B. Pap: 08/25/2017: - PAP, Thin Prep w/HPV rflx HPV Type 16/18-----was performed by me here 07/22/16.  Cytology negative.  HPV negative.  F. Immunizations:  08/25/2017: Influenza:  N/A  Tetanus: T dap was updated with her pregnancy.  Pneumococcal: She has no indication to require a pneumonia vaccine until 65.  Shingrix: Not indicated until age 54.    2. Encounter for surveillance of contraceptive pills 08/25/2017: Continue current birth control pills.  3. Viral warts, unspecified type 08/25/2017: Apply the Aldara cream as directed. - imiquimod (ALDARA) 5 % cream; Apply to affected area three times weekly  Dispense: 12 each; Refill: 1  4Hyperthyroidism 08/25/2017: --Managed by Dr. Fransico Him, Endocrinology    Can wait 1 year for follow-up visit here if things are stable.  Follow-up sooner if needed.    Signed, [address] [place], [place],  [facility] 08/25/2017 10:38 AM

## 2017-08-26 ENCOUNTER — Encounter: Payer: Self-pay | Admitting: Physician Assistant

## 2017-09-01 ENCOUNTER — Encounter: Payer: Self-pay | Admitting: "Endocrinology

## 2017-09-01 ENCOUNTER — Encounter: Payer: Self-pay | Admitting: Physician Assistant

## 2017-09-01 ENCOUNTER — Telehealth: Payer: Self-pay

## 2017-09-01 MED ORDER — AZURETTE 0.15-0.02/0.01 MG (21/5) PO TABS: 1.0000 | ORAL_TABLET | Freq: Every day | ORAL | 0 refills | Status: DC

## 2017-09-01 NOTE — Telephone Encounter (Signed)
Message from Mychart, Generic sent at 09/01/2017 3:10 PM EDT -----    Well the results are not thyroid tests per se, but my cholesterol is way way higher than I have ever seen it, which I think has to do with my thyroid ablation and how I went a little hypothyroid. They are saying they don't see those lab results. Is there a way to send them to them? I am worried about those numbers being so high!  ----- Message -----  From: CMA Rosaline Ezekiel A J  Sent: 09/01/2017 11:44 AM EDT  To: Lindsey Velasquez  Subject: RE: Non-Urgent Medical Question  Lindsey Velasquez    Good morning!! I will be glad to send in a prescription to your [facility] pharmacy. Also regarding your lab work if it concerning your thyroid results then yes you would discuss that with your endocrinologist.    If you have any other questions please feel free to contact our office.    Have a great day!!    Maikayla Beggs J,CMA     ----- Message -----   From: [name]   Sent: 09/01/2017 9:48 AM EDT    To: Frazier RichardsMary Beth Dixon, PA-C  Subject: Non-Urgent Medical Question    I need to refill my Azurette prescription but [facility] And I did receive my lab results. Should I discuss those with you or my Endo?   Call placed to patient she had labs drawn at [facility] and we do not have a copy. Patient will contact [facility] and have them  Fax over a copy for PCP to address

## 2017-09-03 NOTE — Telephone Encounter (Signed)
Received faxed copy of lab results and have faxed over a copy for Dr. Fransico HimNida to look over. Patient is aware

## 2017-09-06 ENCOUNTER — Ambulatory Visit: Payer: Managed Care, Other (non HMO) | Admitting: "Endocrinology

## 2017-09-27 ENCOUNTER — Other Ambulatory Visit: Payer: Self-pay | Admitting: Physician Assistant

## 2017-10-04 ENCOUNTER — Ambulatory Visit: Payer: Managed Care, Other (non HMO) | Admitting: Physician Assistant

## 2017-10-13 ENCOUNTER — Other Ambulatory Visit: Payer: Self-pay | Admitting: "Endocrinology

## 2017-10-13 ENCOUNTER — Ambulatory Visit (INDEPENDENT_AMBULATORY_CARE_PROVIDER_SITE_OTHER): Payer: Managed Care, Other (non HMO) | Admitting: Family Medicine

## 2017-10-13 DIAGNOSIS — Z23 Encounter for immunization: Secondary | ICD-10-CM | POA: Diagnosis not present

## 2017-10-14 LAB — TSH: TSH: 25.87 u[IU]/mL — AB (ref 0.450–4.500)

## 2017-10-14 LAB — T4, FREE: FREE T4: 1.2 ng/dL (ref 0.82–1.77)

## 2017-10-18 ENCOUNTER — Encounter: Payer: Self-pay | Admitting: Physician Assistant

## 2017-10-20 ENCOUNTER — Ambulatory Visit (INDEPENDENT_AMBULATORY_CARE_PROVIDER_SITE_OTHER): Payer: Managed Care, Other (non HMO) | Admitting: "Endocrinology

## 2017-10-20 ENCOUNTER — Encounter: Payer: Self-pay | Admitting: "Endocrinology

## 2017-10-20 VITALS — BP 126/89 | HR 83 | Ht 67.0 in | Wt 186.0 lb

## 2017-10-20 DIAGNOSIS — E890 Postprocedural hypothyroidism: Secondary | ICD-10-CM | POA: Diagnosis not present

## 2017-10-20 MED ORDER — LEVOTHYROXINE SODIUM 112 MCG PO TABS: ORAL_TABLET | ORAL | 3 refills | Status: DC

## 2017-10-20 NOTE — Progress Notes (Signed)
Endocrinology follow-up note    Subjective:    Patient ID: Lindsey Velasquez, female    DOB: Feb 12, 1991, PCP Dorena Bodo, PA-C.   Social History   Socioeconomic History  . Marital status: Married    Spouse name: None  . Number of children: None  . Years of education: None  . Highest education level: None  Social Needs  . Financial resource strain: None  . Food insecurity - worry: None  . Food insecurity - inability: None  . Transportation needs - medical: None  . Transportation needs - non-medical: None  Occupational History  . None  Tobacco Use  . Smoking status: Former Games developer  . Smokeless tobacco: Never Used  Substance and Sexual Activity  . Alcohol use: Yes    Alcohol/week: 0.6 oz    Types: 1 Glasses of wine per week    Comment: once a month  . Drug use: No  . Sexual activity: Yes    Birth control/protection: None  Other Topics Concern  . None  Social History Narrative  . None   Outpatient Encounter Medications as of 04/15/2017  Medication Sig  . metoprolol tartrate (LOPRESSOR) 25 MG tablet Take 1 tablet (25 mg total) by mouth 2 (two) times daily. (Patient not taking: Reported on 04/15/2017)  . Multiple Vitamin (MULTIVITAMIN) tablet Take 1 tablet by mouth daily.  . propranolol (INDERAL) 20 MG tablet Take 1 tablet (20 mg total) 3 (three) times daily by mouth.   No facility-administered encounter medications on file as of 04/15/2017.     ALLERGIES: No Known Allergies  VACCINATION STATUS: Immunization History  Administered Date(s) Administered  . Hep A / Hep B 04/13/2017  . Influenza,inj,Quad PF,6+ Mos 07/22/2016, 04/05/2017  . Tdap 04/13/2017     HPI  Lindsey Velasquez is 27 y.o. female who presents today for follow-up of hypothyroidism.  -She is status post RAI therapy for Graves' disease on May 10, 2017. -She is currently on levothyroxine 88 mcg p.o. every morning.  She reports compliance to his medication.  He continues to regain the  weight she lost when she had thyrotoxicosis.   -She is off of metoprolol at this time.  she denies family history of thyroid dysfunction. denies family hx of thyroid cancer. she denies personal history of goiter.    Review of systems:  Constitutional: + Weight gain , + fatigue, - subjective hyperthermia Eyes: no blurry vision, - xerophthalmia ENT: no sore throat, no nodules palpated in throat, no dysphagia/odynophagia, nor hoarseness Cardiovascular: no Chest Pain, no Shortness of Breath, ++  palpitations, no leg swelling Respiratory: no  Cough, no shortness of breath.   Gastrointestinal: no Nausea, no Vomiting, no Diarhhea Musculoskeletal: no muscle/joint aches Skin: no rashes Neurological: - tremors, no numbness, no tingling, no dizziness Psychiatric: no depression, -  anxiety   Objective:    BP 133/80   Pulse (!) 130   Ht  (1.676 m)   Wt 165 lb (74.8 kg)   BMI 26.63 kg/m   Wt Readings from Last 3 Encounters:  04/15/17 165 lb (74.8 kg)  04/05/17 164 lb 12.8 oz (74.8 kg)  07/22/16 189 lb (85.7 kg)    Physical exam Constitutional: + Slightly over weight for height, not in acute distress, + stable state of mind.   Eyes: PERRLA, EOMI, + exophthalmos with lid lag (improving) ENT: moist mucous membranes, + thyromegaly improved.  no cervical lymphadenopathy  Musculoskeletal: no gross deformities, strength intact in all four extremities Skin: moist, warm, no rashes  Neurological: - tremor with outstretched hands,    CMP     Component Value Date/Time   NA 140 07/27/2016 1210   K 4.0 07/27/2016 1210   CL 106 07/27/2016 1210   CO2 25 07/27/2016 1210   GLUCOSE 87 07/27/2016 1210   BUN 8 07/27/2016 1210   CREATININE 0.61 07/27/2016 1210   CALCIUM 9.5 07/27/2016 1210   PROT 6.9 07/27/2016 1210   ALBUMIN 4.5 07/27/2016 1210   AST 13 07/27/2016 1210   ALT 10 07/27/2016 1210   ALKPHOS 48 07/27/2016 1210   BILITOT 0.8 07/27/2016 1210   GFRNONAA >89 07/27/2016 1210    GFRAA >89 07/27/2016 1210     CBC    Component Value Date/Time   WBC 5.8 07/27/2016 1210   RBC 4.78 07/27/2016 1210   HGB 14.1 07/27/2016 1210   HCT 41.4 07/27/2016 1210   PLT 278 07/27/2016 1210   MCV 86.6 07/27/2016 1210   MCH 29.5 07/27/2016 1210   MCHC 34.1 07/27/2016 1210   RDW 13.1 07/27/2016 1210   LYMPHSABS 2,320 07/27/2016 1210   MONOABS 522 07/27/2016 1210   EOSABS 174 07/27/2016 1210   BASOSABS 0 07/27/2016 1210     Diabetic Labs (most recent): No results found for: HGBA1C  Lipid Panel     Component Value Date/Time   CHOL 170 07/27/2016 1210   TRIG 86 07/27/2016 1210   HDL 48 (L) 07/27/2016 1210   CHOLHDL 3.5 07/27/2016 1210   VLDL 17 07/27/2016 1210   LDLCALC 105 (H) 07/27/2016 1210    Results for WM, FRUCHTER (MRN [id number]) as of 10/20/2017 10:18  Ref. Range 10/13/2017 11:35  TSH Latest Ref Range: 0.450 - 4.500 uIU/mL 25.870 (H)  T4,Free(Direct) Latest Ref Range: 0.82 - 1.77 ng/dL 4.09     Assessment & Plan:   1. RAI hypothyroidism 2. Graves' disease-resolved. -  She is status post therapy with I-131 for hyperthyroidism from Graves' disease with clinical evidence of treatment effect.  -Her thyroid function tests are consistent with inadequate replacement.  -I discussed and increase her levothyroxine to 112 mcg p.o. nightly.    - We discussed about correct intake of levothyroxine, at fasting, with water, separated by at least 30 minutes from breakfast, and separated by more than 4 hours from calcium, iron, multivitamins, acid reflux medications (PPIs). -Patient is made aware of the fact that thyroid hormone replacement is needed for life, dose to be adjusted by periodic monitoring of thyroid function tests. -She is advised to delay pregnancy for at least a total of 6 months after her radioactive iodine exposure.  She is  also advised to report if pregnancy is planned or can confirmed.  - I advised her to maintain close follow up with Dorena Bodo, PA-C for primary care needs.  Follow up plan: 10 weeks with TSH and free T4.  Marquis Lunch, MD [facility] Phone: [phone]  Fax: [phone]   04/15/2017, 10:12 AM  This note was partially dictated with voice recognition software. Similar sounding words can be transcribed inadequately or may not  be corrected upon review.

## 2017-10-21 ENCOUNTER — Other Ambulatory Visit: Payer: Self-pay | Admitting: Physician Assistant

## 2017-11-23 ENCOUNTER — Encounter: Payer: Self-pay | Admitting: "Endocrinology

## 2017-11-24 ENCOUNTER — Encounter: Payer: Self-pay | Admitting: Physician Assistant

## 2017-11-24 DIAGNOSIS — E890 Postprocedural hypothyroidism: Secondary | ICD-10-CM

## 2017-11-24 DIAGNOSIS — E0500 Thyrotoxicosis with diffuse goiter without thyrotoxic crisis or storm: Secondary | ICD-10-CM

## 2017-11-24 NOTE — Telephone Encounter (Signed)
New Messages from Maralyn SagoSarah Llamas:   Is it possible to have my levels tested and include T3 as well? Ive just been researching like crazy and since my labs are free can I have them check now rather than wait til august? I really don't want to come in, since it costs 60 bucks each time, but just want to know my levels are improving. Is that possible?  Also, if possible, could he recommend the best multivitamin for me to take that will work best with my not having a thyroid and the best amount of biotin to not interfere with tests but still have some?

## 2017-12-14 ENCOUNTER — Other Ambulatory Visit: Payer: Self-pay

## 2017-12-14 MED ORDER — LEVOTHYROXINE SODIUM 112 MCG PO TABS: ORAL_TABLET | ORAL | 1 refills | Status: DC

## 2018-01-12 ENCOUNTER — Emergency Department: Payer: Managed Care, Other (non HMO)

## 2018-01-12 ENCOUNTER — Emergency Department
Admission: EM | Admit: 2018-01-12 | Discharge: 2018-01-12 | Disposition: A | Discharge status: Home / Self Care | Payer: Managed Care, Other (non HMO) | Attending: Emergency Medicine | Admitting: Emergency Medicine

## 2018-01-12 ENCOUNTER — Other Ambulatory Visit: Payer: Self-pay

## 2018-01-12 DIAGNOSIS — Z87891 Personal history of nicotine dependence: Secondary | ICD-10-CM | POA: Diagnosis not present

## 2018-01-12 DIAGNOSIS — Z79899 Other long term (current) drug therapy: Secondary | ICD-10-CM | POA: Diagnosis not present

## 2018-01-12 DIAGNOSIS — E039 Hypothyroidism, unspecified: Secondary | ICD-10-CM | POA: Insufficient documentation

## 2018-01-12 DIAGNOSIS — M25561 Pain in right knee: Secondary | ICD-10-CM | POA: Insufficient documentation

## 2018-01-12 HISTORY — DX: Disorder of thyroid, unspecified: E07.9

## 2018-01-12 MED ORDER — MELOXICAM 15 MG PO TABS: 15.0000 mg | ORAL_TABLET | Freq: Every day | ORAL | 1 refills | Status: AC
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
## 2018-01-14 ENCOUNTER — Other Ambulatory Visit: Payer: Self-pay | Admitting: "Endocrinology

## 2018-01-15 LAB — T4, FREE: Free T4: 1.81 ng/dL — ABNORMAL HIGH (ref 0.82–1.77)

## 2018-01-15 LAB — TSH: TSH: 0.761 u[IU]/mL (ref 0.450–4.500)

## 2018-01-17 ENCOUNTER — Ambulatory Visit (INDEPENDENT_AMBULATORY_CARE_PROVIDER_SITE_OTHER): Payer: Managed Care, Other (non HMO) | Admitting: Physician Assistant

## 2018-01-17 ENCOUNTER — Encounter: Payer: Self-pay | Admitting: Physician Assistant

## 2018-01-17 VITALS — BP 120/80 | HR 84 | Temp 98.7°F | Wt 193.4 lb

## 2018-01-17 DIAGNOSIS — S8011XD Contusion of right lower leg, subsequent encounter: Secondary | ICD-10-CM

## 2018-01-17 DIAGNOSIS — Z09 Encounter for follow-up examination after completed treatment for conditions other than malignant neoplasm: Secondary | ICD-10-CM | POA: Diagnosis not present
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
## 2018-01-18 ENCOUNTER — Ambulatory Visit: Payer: Managed Care, Other (non HMO) | Admitting: "Endocrinology

## 2018-01-21 ENCOUNTER — Ambulatory Visit: Payer: Managed Care, Other (non HMO) | Admitting: "Endocrinology

## 2018-01-24 ENCOUNTER — Ambulatory Visit: Payer: Managed Care, Other (non HMO) | Admitting: "Endocrinology

## 2018-01-24 ENCOUNTER — Encounter: Payer: Self-pay | Admitting: "Endocrinology

## 2018-01-24 VITALS — BP 122/87 | HR 74 | Ht 67.0 in | Wt 193.0 lb

## 2018-01-24 DIAGNOSIS — E890 Postprocedural hypothyroidism: Secondary | ICD-10-CM | POA: Diagnosis not present

## 2018-01-24 NOTE — Progress Notes (Signed)
Endocrinology follow-up note    Subjective:    Patient ID: Lindsey Velasquez, female    DOB: 1991/04/13, PCP Dorena Bodoixon, Mary B, PA-C.   Social History   Socioeconomic History  . Marital status: Married    Spouse name: None  . Number of children: None  . Years of education: None  . Highest education level: None  Social Needs  . Financial resource strain: None  . Food insecurity - worry: None  . Food insecurity - inability: None  . Transportation needs - medical: None  . Transportation needs - non-medical: None  Occupational History  . None  Tobacco Use  . Smoking status: Former Games developermoker  . Smokeless tobacco: Never Used  Substance and Sexual Activity  . Alcohol use: Yes    Alcohol/week: 0.6 oz    Types: 1 Glasses of wine per week    Comment: once a month  . Drug use: No  . Sexual activity: Yes    Birth control/protection: None  Other Topics Concern  . None  Social History Narrative  . None   Outpatient Encounter Medications as of 04/15/2017  Medication Sig  . metoprolol tartrate (LOPRESSOR) 25 MG tablet Take 1 tablet (25 mg total) by mouth 2 (two) times daily. (Patient not taking: Reported on 04/15/2017)  . Multiple Vitamin (MULTIVITAMIN) tablet Take 1 tablet by mouth daily.  . propranolol (INDERAL) 20 MG tablet Take 1 tablet (20 mg total) 3 (three) times daily by mouth.   No facility-administered encounter medications on file as of 04/15/2017.     ALLERGIES: No Known Allergies  VACCINATION STATUS: Immunization History  Administered Date(s) Administered  . Hep A / Hep B 04/13/2017  . Influenza,inj,Quad PF,6+ Mos 07/22/2016, 04/05/2017  . Tdap 04/13/2017     HPI  Lindsey Velasquez is 27 year old  female who presents today for follow-up of hypothyroidism.  -She is status post RAI therapy for Graves' disease on May 10, 2017. -She is currently on levothyroxine 112  mcg p.o. every morning.  She reports compliance to his medication.  She has no new  complaints today.   -She is off of metoprolol at this time.  she denies family history of thyroid dysfunction. denies family hx of thyroid cancer. she denies personal history of goiter.    Review of systems:  Constitutional: + Weight gain , + fatigue, - subjective hyperthermia Eyes: no blurry vision, - xerophthalmia ENT: no sore throat, no nodules palpated in throat, no dysphagia/odynophagia, nor hoarseness Cardiovascular: no Chest Pain, no Shortness of Breath, ++  palpitations, no leg swelling Respiratory: no  Cough, no shortness of breath.   Gastrointestinal: no Nausea, no Vomiting, no Diarhhea Musculoskeletal: no muscle/joint aches Skin: no rashes Neurological: - tremors, no numbness, no tingling, no dizziness Psychiatric: no depression, -  anxiety   Objective:     Vitals:   01/24/18 0911  Weight: 193 lb (87.5 kg)  Height: 5\' 7"  (1.702 m)   Blood pressure 122/87.  Pulse rate 74.   Physical exam Constitutional: +  over weight for height, not in acute distress, + stable state of mind.   Eyes: PERRLA, EOMI, + exophthalmos with lid lag (improving) ENT: moist mucous membranes, + thyromegaly improved.  no cervical lymphadenopathy  Musculoskeletal: no gross deformities, strength intact in all four extremities Skin: moist, warm, no rashes Neurological: - tremor with outstretched hands,    CMP     Component Value Date/Time   NA 140 07/27/2016 1210   K 4.0 07/27/2016 1210   CL 106  07/27/2016 1210   CO2 25 07/27/2016 1210   GLUCOSE 87 07/27/2016 1210   BUN 8 07/27/2016 1210   CREATININE 0.61 07/27/2016 1210   CALCIUM 9.5 07/27/2016 1210   PROT 6.9 07/27/2016 1210   ALBUMIN 4.5 07/27/2016 1210   AST 13 07/27/2016 1210   ALT 10 07/27/2016 1210   ALKPHOS 48 07/27/2016 1210   BILITOT 0.8 07/27/2016 1210   GFRNONAA >89 07/27/2016 1210   GFRAA >89 07/27/2016 1210     Lipid Panel     Component Value Date/Time   CHOL 170 07/27/2016 1210   TRIG 86 07/27/2016 1210    HDL 48 (L) 07/27/2016 1210   CHOLHDL 3.5 07/27/2016 1210   VLDL 17 07/27/2016 1210   LDLCALC 105 (H) 07/27/2016 1210   Recent Results (from the past 2160 hour(s))  T4, free     Status: Abnormal   Collection Time: 01/14/18 10:43 AM  Result Value Ref Range   Free T4 1.81 (H) 0.82 - 1.77 ng/dL  TSH     Status: None   Collection Time: 01/14/18 10:43 AM  Result Value Ref Range   TSH 0.761 0.450 - 4.500 uIU/mL     Assessment & Plan:   1. RAI hypothyroidism 2. Graves' disease-resolved.  She is status post therapy with I-131 for hyperthyroidism from Graves' disease with clinical evidence of treatment effect.  -Her thyroid function tests are consistent with adequate replacement, slightly above target free T4 at 1.81. -She will benefit from continued treatment with current dose of levothyroxine at 112 mcg p.o. Nightly.   - We discussed about correct intake of levothyroxine, at fasting, with water, separated by at least 30 minutes from breakfast, and separated by more than 4 hours from calcium, iron, multivitamins, acid reflux medications (PPIs). -Patient is made aware of the fact that thyroid hormone replacement is needed for life, dose to be adjusted by periodic monitoring of thyroid function tests.   -  She is  also advised to report if pregnancy is planned or can confirmed.  - I advised her to maintain close follow up with Dorena Bodoixon, Mary B, PA-C for primary care needs.  Follow up plan: 10 weeks with TSH and free T4.  Marquis LunchGebre Early Steel, MD [facility] Phone: [phone]  Fax: [phone]   04/15/2017, 10:12 AM  This note was partially dictated with voice recognition software. Similar sounding words can be transcribed inadequately or may not  be corrected upon review.

## 2018-04-21 ENCOUNTER — Ambulatory Visit (HOSPITAL_COMMUNITY): Payer: Self-pay | Admitting: Internal Medicine

## 2018-04-26 ENCOUNTER — Other Ambulatory Visit: Payer: Self-pay | Admitting: "Endocrinology

## 2018-05-10 ENCOUNTER — Other Ambulatory Visit: Payer: Self-pay | Admitting: "Endocrinology

## 2018-05-10 MED ORDER — LEVOTHYROXINE SODIUM 112 MCG PO TABS: ORAL_TABLET | ORAL | 3 refills | Status: DC

## 2018-05-11 ENCOUNTER — Other Ambulatory Visit: Payer: Self-pay

## 2018-05-11 MED ORDER — LEVOTHYROXINE SODIUM 112 MCG PO TABS: ORAL_TABLET | ORAL | 1 refills | Status: DC

## 2018-05-24 ENCOUNTER — Other Ambulatory Visit: Payer: Self-pay | Admitting: "Endocrinology

## 2018-06-27 ENCOUNTER — Encounter (HOSPITAL_BASED_OUTPATIENT_CLINIC_OR_DEPARTMENT_OTHER): Payer: Self-pay

## 2018-06-27 NOTE — Nursing Note (Signed)
Reviewed scanned records will send to front desk to schedule .sign

## 2018-07-18 ENCOUNTER — Ambulatory Visit (HOSPITAL_BASED_OUTPATIENT_CLINIC_OR_DEPARTMENT_OTHER): Payer: Self-pay | Admitting: Dermatology

## 2018-07-27 ENCOUNTER — Ambulatory Visit: Payer: Managed Care, Other (non HMO) | Admitting: "Endocrinology

## 2018-08-29 ENCOUNTER — Encounter: Payer: Managed Care, Other (non HMO) | Admitting: Physician Assistant

## 2018-09-02 ENCOUNTER — Ambulatory Visit
Payer: BC Managed Care – PPO | Attending: INTERNAL MEDICINE-ENDOCRINOLOGY-DIABETES AND METABOLISM | Admitting: Rheumatology

## 2018-09-02 ENCOUNTER — Other Ambulatory Visit: Payer: Self-pay

## 2018-09-02 DIAGNOSIS — E0590 Thyrotoxicosis, unspecified without thyrotoxic crisis or storm: Principal | ICD-10-CM | POA: Insufficient documentation

## 2018-09-02 LAB — THYROXINE, TOTAL T4: T4 TOTAL: 10.4 ug/dL (ref 4.0–11.0)

## 2018-09-02 LAB — TRIIODOTHYRONINE, TOTAL (TOTAL T3): T3 TOTAL: 96 ng/dL (ref 58–159)

## 2018-09-02 LAB — THYROID STIMULATING HORMONE (SENSITIVE TSH): TSH: 0.094 u[IU]/mL — ABNORMAL LOW (ref 0.350–5.000)

## 2018-09-02 LAB — THYROXINE, FREE (FREE T4): THYROXINE (T4), FREE: 1.28 ng/dL — ABNORMAL HIGH (ref 0.70–1.25)

## 2018-09-02 LAB — HCG, PLASMA OR SERUM QUANTITATIVE, PREGNANCY: HCG QUANTITATIVE PREGNANCY: <1 IU/L (ref <5)

## 2018-09-05 LAB — THYROID STIMULATING IMMUNOGLOBULIN (TSI), SERUM: THYROID STIMULATING IMMUNOGLOBULIN (TSI), SERUM: 7.1 IU/L — ABNORMAL HIGH (ref <0.1)

## 2018-09-07 ENCOUNTER — Other Ambulatory Visit: Payer: Self-pay

## 2018-09-07 ENCOUNTER — Ambulatory Visit
Payer: BC Managed Care – PPO | Attending: INTERNAL MEDICINE-ENDOCRINOLOGY-DIABETES AND METABOLISM | Admitting: INTERNAL MEDICINE-ENDOCRINOLOGY-DIABETES AND METABOLISM

## 2018-09-07 ENCOUNTER — Encounter (HOSPITAL_BASED_OUTPATIENT_CLINIC_OR_DEPARTMENT_OTHER): Payer: Self-pay | Admitting: INTERNAL MEDICINE-ENDOCRINOLOGY-DIABETES AND METABOLISM

## 2018-09-07 DIAGNOSIS — E039 Hypothyroidism, unspecified: Principal | ICD-10-CM

## 2018-09-07 DIAGNOSIS — E890 Postprocedural hypothyroidism: Secondary | ICD-10-CM

## 2018-09-07 DIAGNOSIS — E0590 Thyrotoxicosis, unspecified without thyrotoxic crisis or storm: Secondary | ICD-10-CM

## 2018-09-07 NOTE — Progress Notes (Signed)
[facility]  TELEMEDICINE CONSULTATION       Name: [name]  MRN: [id number]  DOB: 10-26-1990    Date of Service:  09/07/2018    TELEMEDICINE DOCUMENTATION:  Patient Location:  MyChart video visit from home address: [address]  [place] [address]   Patient/family aware of provider location:  yes  Patient/family consent for telemedicine:  yes  Examination observed and performed by:  Ardelle Park, MD    Chief Complaint:  Post radioactive iodine ablation hypothyroidism. ( December 2018)     History of Present Illness:  [name] is a 28 y.o. female  who was interviewed today for post radioactive iodine ablation hypothyroidism.     Information gathered after talking to the patient and reviewing previous records she was previously seeing an endocrinologist in [place]. ( Dr. Purcell Nails).   According to her she delivered a normal baby healthy baby girl in 20,015, and during that pregnancy her thyroid functions were fine.   In November 2018 she presented to the doctor's with goiter, tachycardia and weight loss.   Workup done at that time showed hyperthyroidism with a suppressed TSH and elevated T4 and T3.   RAI uptake scan showed 73% uptake at 12:00 p.m, radioactive iodine ablation was done using 12 mCi dose.   Dim never received methimazole or PTU, and was treated with beta-blockers prior to RAI ablation.     May of 2019 about 6 months after the radioactive iodine ablation her TSH was noted to be 35.   Levothyroxine was started May of 2019.   Currently she is on 112 mcg.  She takes the pill daily, on empty stomach and waits 30 minutes before breakfast.     She is currently not using any contraception, and plans to have another baby in the next 3-6 months.     She had recent blood work done which was reviewed with the patient during this appointment.     September 02 2018.    TSH  0.094  Total T4 10.4  Free T4 1.28.   Total T3 96.   Pregnancy test negative.     Patient denies  ever being diagnosed with Graves orbitopathy, both before and after radioactive iodine ablation.   She has started taking prenatal vitamin cheese taking at bedtime.(  Contain 200 mcg of biotin)       Patient Active Problem List    Diagnosis   . Active labor   . Pregnant   . Wart   . Abdominal pain     Past Medical History:   Diagnosis Date   . Seasonal allergic reaction     occasional OTC meds         Past Surgical History:   Procedure Laterality Date   . HX WISDOM TEETH EXTRACTION  2010         Current Outpatient Medications   Medication Sig   . hydrocortisone 2.5 % Cream Apply topically Twice daily   . Norethindrone, Contraceptive, (CAMILA) 0.35 mg Oral Tablet Take 1 Tab (0.35 mg total) by mouth Once a day   . PNV Combo #19-Iron-Fol Ac-DHA 22-6-1-200 mg Oral Capsule Take 1 Cap by mouth Once a day     No Known Allergies  Family Medical History:     Problem Relation (Age of Onset)    Eclampsia Sister    Hypertension (High Blood Pressure) Mother    Spont Ab Mother, Sister, Sister  Social History     Tobacco Use   . Smoking status: Never Smoker   . Smokeless tobacco: Never Used   Substance Use Topics   . Alcohol use: No     Comment: occasionally once per week         Review of Systems:  Constitutional: negative   Denies tachycardia, tremors, weight loss.   Denies heat or cold intolerance.   Reports hair thinning and hair loss in the last 6 months.   Denies diarrhea.     Physical Exam:    Physical exam not because this was a virtual visit.   However I was unable to see any evidence of Graves orbitopathy.   On inspection no evidence of goiter noted.     Data Reviewed:    Assessment/Plan:    1. Post RAI ablation hypothyroidism.   2. Planning to conceive.     TSH by goals prior to conception and during different trimesters of pregnancy discussed with the patient.   Before conception TSH target is between 0.5- 2.5  During 1st trimester TSH target 0.1- to 2  During 2nd trimester TSH target 0.2 to 2  During her  trimester TSH target is 0.3 to 3    Will decrease her levothyroxine dose from 112 mcg daily to 112 mcg 6 days a week and half a pill on day 7. Repeat TSH in 4 weeks.  Patient is advised not to take biotin for 24 hours prior to blood work.   Patient's TSI antibodies are elevated 7.1.  Patient's gynecology need to know this information to monitor fetal heart rate and thyroid during pregnancy.     Patient education:  During pregnancy her levothyroxine dose will be increased by 20%.                                  Patient to call in a as soon as find out that she is pregnant, to make the dose adjustment.     Will need TSH done every 3 months during pregnancy.   The patient to take prenatal vitamins at bedtime.     All questions answered.     I spend more  than 45 minutes reviewing patient record, discussing plan, and pregnancy specific target      ICD-10-CM    1. Hyperthyroidism E05.90 Tsh     T4 Free     T3     THYROID STIMULATING IMMUNOGLOBULIN (TSI), SERUM     Hcg Quant/Serum Pregnancy     T4      Orders Placed This Encounter   . Tsh   . T4 Free   . T3   . THYROID STIMULATING IMMUNOGLOBULIN (TSI), SERUM   . Hcg Quant/Serum Pregnancy   . T4       Follow up:    Four months.       Ardelle Park, MD   Assistant Professor    Endocrinology and [facility] Medicine  [phone]

## 2018-09-24 ENCOUNTER — Encounter (HOSPITAL_BASED_OUTPATIENT_CLINIC_OR_DEPARTMENT_OTHER): Payer: Self-pay | Admitting: INTERNAL MEDICINE-ENDOCRINOLOGY-DIABETES AND METABOLISM

## 2018-10-27 ENCOUNTER — Encounter (HOSPITAL_BASED_OUTPATIENT_CLINIC_OR_DEPARTMENT_OTHER): Payer: Self-pay | Admitting: INTERNAL MEDICINE-ENDOCRINOLOGY-DIABETES AND METABOLISM

## 2018-11-02 ENCOUNTER — Encounter (FREE_STANDING_LABORATORY_FACILITY): Admit: 2018-11-02 | Discharge: 2018-11-02 | Disposition: A | Discharge status: Home / Self Care | Payer: Self-pay

## 2018-11-02 ENCOUNTER — Other Ambulatory Visit (FREE_STANDING_LABORATORY_FACILITY): Payer: Self-pay | Admitting: Anatomic Pathology & Clinical Pathology

## 2018-11-02 ENCOUNTER — Encounter (FREE_STANDING_LABORATORY_FACILITY): Payer: Self-pay | Admitting: Obstetrics & Gynecology

## 2018-11-02 ENCOUNTER — Encounter (FREE_STANDING_LABORATORY_FACILITY)
Admit: 2018-11-02 | Discharge: 2018-11-02 | Disposition: A | Discharge status: Home / Self Care | Payer: Self-pay | Attending: Obstetrics & Gynecology | Admitting: Obstetrics & Gynecology

## 2018-11-02 DIAGNOSIS — N926 Irregular menstruation, unspecified: Secondary | ICD-10-CM

## 2018-11-02 LAB — LH: LUTEINIZING HORMONE: 18.8 IU/L

## 2018-11-02 LAB — HCG, PLASMA OR SERUM QUANTITATIVE, PREGNANCY: HCG QUANTITATIVE PREGNANCY: <1 IU/L (ref <5)

## 2018-11-02 LAB — FSH: FSH: 5.9 IU/L

## 2018-11-02 LAB — PROLACTIN: PROLACTIN: 14.2 ng/mL

## 2018-11-02 LAB — THYROID STIMULATING HORMONE (SENSITIVE TSH): TSH: 0.518 u[IU]/mL (ref 0.350–5.000)

## 2018-11-02 LAB — ESTRADIOL: ESTRADIOL: 239 pg/mL

## 2018-11-03 LAB — SEX HORMONE-BINDING GLOBULIN (SHBG), SERUM: SEX HORMONE-BINDING GLOBULIN (SHBG), SERUM: 37 nmol/L

## 2018-11-04 LAB — ANDROSTENEDIONE, SERUM: ANDROSTENEDIONE, SERUM: 411 ng/dL — ABNORMAL HIGH (ref 30–200)

## 2018-11-04 LAB — 17-HYDROXYPROGESTERONE, SERUM: 17-HYDROXYPROGESTERONE, SERUM: 166 ng/dL

## 2018-11-07 LAB — TESTOSTERONE FREE (DIALYSIS) AND TOTAL,MS
TESTOSTERONE, FREE, NG/DL: 1.28 ng/dL — ABNORMAL HIGH (ref 0.06–1.06)
TESTOSTERONE, TOTAL, SERUM: 71 ng/dL — ABNORMAL HIGH (ref 8–60)

## 2018-11-17 ENCOUNTER — Encounter (HOSPITAL_BASED_OUTPATIENT_CLINIC_OR_DEPARTMENT_OTHER): Payer: Self-pay | Admitting: INTERNAL MEDICINE-ENDOCRINOLOGY-DIABETES AND METABOLISM

## 2018-12-08 ENCOUNTER — Encounter (HOSPITAL_BASED_OUTPATIENT_CLINIC_OR_DEPARTMENT_OTHER): Payer: Self-pay | Admitting: INTERNAL MEDICINE-ENDOCRINOLOGY-DIABETES AND METABOLISM

## 2018-12-12 ENCOUNTER — Other Ambulatory Visit (HOSPITAL_BASED_OUTPATIENT_CLINIC_OR_DEPARTMENT_OTHER): Payer: Self-pay | Admitting: INTERNAL MEDICINE-ENDOCRINOLOGY-DIABETES AND METABOLISM

## 2018-12-12 MED ORDER — LEVOTHYROXINE 112 MCG TABLET
ORAL_TABLET | ORAL | 1 refills | Status: AC
Start: 2018-12-12 — End: ?

## 2018-12-12 NOTE — Telephone Encounter (Signed)
Current Outpatient Medications   Medication Sig   . hydrocortisone 2.5 % Cream Apply topically Twice daily   . Norethindrone, Contraceptive, (CAMILA) 0.35 mg Oral Tablet Take 1 Tab (0.35 mg total) by mouth Once a day   . PNV Combo #19-Iron-Fol Ac-DHA 22-6-1-200 mg Oral Capsule Take 1 Cap by mouth Once a day     Last dept visit:  09/07/2018  Next pending dept visit: 01/26/2019  Refill for Levothyroxine pended to provider.  Tyjon Bowen, MA 12/12/2018, 15:47

## 2019-01-02 ENCOUNTER — Encounter (HOSPITAL_BASED_OUTPATIENT_CLINIC_OR_DEPARTMENT_OTHER): Payer: Self-pay | Admitting: INTERNAL MEDICINE-ENDOCRINOLOGY-DIABETES AND METABOLISM

## 2019-01-02 ENCOUNTER — Other Ambulatory Visit (HOSPITAL_BASED_OUTPATIENT_CLINIC_OR_DEPARTMENT_OTHER): Payer: Self-pay | Admitting: INTERNAL MEDICINE-ENDOCRINOLOGY-DIABETES AND METABOLISM

## 2019-01-02 DIAGNOSIS — E039 Hypothyroidism, unspecified: Secondary | ICD-10-CM

## 2019-01-04 ENCOUNTER — Other Ambulatory Visit (FREE_STANDING_LABORATORY_FACILITY): Payer: Self-pay | Admitting: Anatomic Pathology & Clinical Pathology

## 2019-01-04 ENCOUNTER — Encounter (FREE_STANDING_LABORATORY_FACILITY): Admit: 2019-01-04 | Discharge: 2019-01-04 | Disposition: A | Discharge status: Home / Self Care | Payer: Self-pay

## 2019-01-04 ENCOUNTER — Encounter (FREE_STANDING_LABORATORY_FACILITY)
Admit: 2019-01-04 | Discharge: 2019-01-04 | Disposition: A | Discharge status: Home / Self Care | Payer: Self-pay | Attending: INTERNAL MEDICINE-ENDOCRINOLOGY-DIABETES AND METABOLISM | Admitting: INTERNAL MEDICINE-ENDOCRINOLOGY-DIABETES AND METABOLISM

## 2019-01-04 ENCOUNTER — Encounter (FREE_STANDING_LABORATORY_FACILITY): Payer: Self-pay | Admitting: INTERNAL MEDICINE-ENDOCRINOLOGY-DIABETES AND METABOLISM

## 2019-01-04 LAB — TRIIODOTHYRONINE, TOTAL (TOTAL T3): T3 TOTAL: 89 ng/dL (ref 58–159)

## 2019-01-04 LAB — THYROID STIMULATING HORMONE (SENSITIVE TSH): TSH: 1.073 u[IU]/mL (ref 0.350–5.000)

## 2019-01-04 LAB — THYROXINE, FREE (FREE T4): THYROXINE (T4), FREE: 1.13 ng/dL (ref 0.70–1.25)

## 2019-01-12 ENCOUNTER — Encounter (HOSPITAL_BASED_OUTPATIENT_CLINIC_OR_DEPARTMENT_OTHER): Payer: Self-pay | Admitting: INTERNAL MEDICINE-ENDOCRINOLOGY-DIABETES AND METABOLISM

## 2019-01-26 ENCOUNTER — Encounter (HOSPITAL_BASED_OUTPATIENT_CLINIC_OR_DEPARTMENT_OTHER): Payer: Self-pay | Admitting: INTERNAL MEDICINE-ENDOCRINOLOGY-DIABETES AND METABOLISM

## 2019-01-26 ENCOUNTER — Other Ambulatory Visit: Payer: Self-pay

## 2019-01-26 ENCOUNTER — Ambulatory Visit
Payer: BC Managed Care – PPO | Attending: INTERNAL MEDICINE-ENDOCRINOLOGY-DIABETES AND METABOLISM | Admitting: INTERNAL MEDICINE-ENDOCRINOLOGY-DIABETES AND METABOLISM

## 2019-01-26 DIAGNOSIS — E039 Hypothyroidism, unspecified: Secondary | ICD-10-CM

## 2019-01-26 DIAGNOSIS — E890 Postprocedural hypothyroidism: Secondary | ICD-10-CM | POA: Insufficient documentation

## 2019-01-28 NOTE — Progress Notes (Signed)
[name]  Virtual visit      Name: [name]  MRN: [id number]  DOB: 09-May-1991    Date of Service:  01/26/2019    TELEMEDICINE DOCUMENTATION:  Patient Location:  MyChart video visit from home address: [address]  [place] [address]   Patient/family aware of provider location:  yes  Patient/family consent for telemedicine:  yes  Examination observed and performed by:  Rose Phi, MD    Problem List:     1.  Post radioactive iodine ablation hypothyroidism. ( December 2018)    Last Plan:     Levothyroxine 112 mcg x 6 days and 1/2 pill on day 7.     Review:     Reports feeling well.   Wants to start a family.   Recently diagnosed with PCOS.   Working night shifts.    Denies heat or cold intolerance, constipation or diarrhea  Denies nausea vomiting.      Last appointment was in July 2020.   Results for Katie, [name] (MRN [id number]) as of 01/28/2019 19:27   Ref. Range 01/04/2019 14:19   TSH Latest Ref Range: 0.350 - 5.000 uIU/mL 1.073   THYROXINE, FREE (FREE T4) Latest Ref Range: 0.70 - 1.25 ng/dL 1.13     Results for [name], Katie (MRN [id number]) as of 01/28/2019 19:27   Ref. Range 11/02/2018 10:31   17-HYDROXYPROGESTERONE, SERUM Latest Units: ng/dL 166   DEHYDROEPIANDROSTERONE (DHEA), SERUM Latest Ref Range: <13 ng/mL 8.5       TESTOSTERONE, FREE, NG/DL   0.06 - 1.06 ng/dL  1.28High       Comment:    -------------------ADDITIONAL INFORMATION-------------------   Testing performed by [facility].   This test was developed and its performance characteristics   determined by [facility] in a manner consistent with CLIA   requirements. This test has not been cleared or approved by   the U.S. Food and Drug Administration.    TESTOSTERONE, TOTAL, SERUM   8 - 60 ng/dL  71High         History of Present Illness:  [name] is a 28 y.o. female  who was interviewed today for post radioactive iodine ablation hypothyroidism.     Information  gathered after talking to the patient and reviewing previous records she was previously seeing an endocrinologist in [place]. ( Dr. Loni Beckwith).   According to her she delivered a normal baby healthy baby girl in 20,015, and during that pregnancy her thyroid functions were fine.   In November 2018 she presented to the doctor's with goiter, tachycardia and weight loss.   Workup done at that time showed hyperthyroidism with a suppressed TSH and elevated T4 and T3.   RAI uptake scan showed 73% uptake at 12:00 p.m, radioactive iodine ablation was done using 12 mCi dose.   [name] never received methimazole or PTU, and was treated with beta-blockers prior to RAI ablation.     May of 2019 about 6 months after the radioactive iodine ablation her TSH was noted to be 35.   Levothyroxine was started May of 2019.   Currently she is on 112 mcg.  She takes the pill daily, on empty stomach and waits 30 minutes before breakfast.     She is currently not using any contraception, and plans to have another baby in the next 3-6 months.     She had recent blood work done which was reviewed with the patient during this appointment.  September 02 2018.    TSH  0.094  Total T4 10.4  Free T4 1.28.   Total T3 96.   Pregnancy test negative.     Patient denies ever being diagnosed with Graves orbitopathy, both before and after radioactive iodine ablation.   She has started taking prenatal vitamin cheese taking at bedtime.(  Contain 200 mcg of biotin)       Patient Active Problem List    Diagnosis   . Active labor   . Pregnant   . Wart   . Abdominal pain     Past Medical History:   Diagnosis Date   . Seasonal allergic reaction     occasional OTC meds         Past Surgical History:   Procedure Laterality Date   . HX WISDOM TEETH EXTRACTION  2010         Current Outpatient Medications   Medication Sig   . hydrocortisone 2.5 % Cream Apply topically Twice daily   . levothyroxine (SYNTHROID) 112 mcg Oral Tablet Take one pill daily Monday  through Saturday and 1/2 pill on Sunday.   . Norethindrone, Contraceptive, (CAMILA) 0.35 mg Oral Tablet Take 1 Tab (0.35 mg total) by mouth Once a day   . PNV Combo #19-Iron-Fol Ac-DHA 22-6-1-200 mg Oral Capsule Take 1 Cap by mouth Once a day     No Known Allergies  Family Medical History:     Problem Relation (Age of Onset)    Eclampsia Sister    Hypertension (High Blood Pressure) Mother    Spont Ab Mother, Sister, Sister            Social History     Tobacco Use   . Smoking status: Never Smoker   . Smokeless tobacco: Never Used   Substance Use Topics   . Alcohol use: No     Comment: occasionally once per week         Review of Systems:  Constitutional: negative   Denies tachycardia, tremors, weight loss.   Denies heat or cold intolerance.   Reports hair thinning and hair loss in the last 6 months.   Denies diarrhea.     Physical Exam:    Physical exam not because this was a virtual visit.   However I was unable to see any evidence of Graves orbitopathy.   On inspection no evidence of goiter noted.     Data Reviewed:    Assessment/Plan:    1. Post RAI ablation hypothyroidism.   2. Planning to conceive.     TSH by goals prior to conception and during different trimesters of pregnancy discussed with the patient.   Before conception TSH target is between 0.5- 2.5  During 1st trimester TSH target 0.1- to 2  During 2nd trimester TSH target 0.2 to 2  During her trimester TSH target is 0.3 to 3    Continue levothyroxine 112 mcg 6 days a week and half a pill on day 7.   Repeat TSH in 2 months.     Patient's TSI antibodies are elevated 7.1.  Patient's gynecology need to know this information to monitor fetal heart rate and thyroid during pregnancy.     Patient education:  During pregnancy her levothyroxine dose will be increased by 20%.                                  Patient to call in  a as soon as find out that she is pregnant, to make the dose adjustment.       Total time spent 15 minutes.       Ardelle ParkADNAN Maxamilian Amadon, MD      Assistant Professor    Endocrinology and [facility]  [facility]  [phone]

## 2019-03-23 ENCOUNTER — Other Ambulatory Visit (HOSPITAL_BASED_OUTPATIENT_CLINIC_OR_DEPARTMENT_OTHER): Payer: Self-pay | Admitting: INTERNAL MEDICINE-ENDOCRINOLOGY-DIABETES AND METABOLISM

## 2019-03-23 ENCOUNTER — Encounter (HOSPITAL_BASED_OUTPATIENT_CLINIC_OR_DEPARTMENT_OTHER): Payer: Self-pay | Admitting: INTERNAL MEDICINE-ENDOCRINOLOGY-DIABETES AND METABOLISM

## 2019-03-23 ENCOUNTER — Encounter (FREE_STANDING_LABORATORY_FACILITY)
Admit: 2019-03-23 | Discharge: 2019-03-23 | Disposition: A | Discharge status: Home / Self Care | Payer: BLUE CROSS/BLUE SHIELD | Attending: INTERNAL MEDICINE-ENDOCRINOLOGY-DIABETES AND METABOLISM | Admitting: INTERNAL MEDICINE-ENDOCRINOLOGY-DIABETES AND METABOLISM

## 2019-03-23 ENCOUNTER — Other Ambulatory Visit: Payer: Self-pay

## 2019-03-23 ENCOUNTER — Ambulatory Visit (HOSPITAL_BASED_OUTPATIENT_CLINIC_OR_DEPARTMENT_OTHER): Payer: Self-pay | Admitting: INTERNAL MEDICINE-ENDOCRINOLOGY-DIABETES AND METABOLISM

## 2019-03-23 DIAGNOSIS — E039 Hypothyroidism, unspecified: Secondary | ICD-10-CM | POA: Insufficient documentation

## 2019-03-23 LAB — THYROID STIMULATING HORMONE (SENSITIVE TSH): TSH: 1.815 u[IU]/mL (ref 0.350–5.000)

## 2019-03-23 LAB — HCG, PLASMA OR SERUM QUANTITATIVE, PREGNANCY: HCG QUANTITATIVE PREGNANCY: 7597 IU/L — ABNORMAL HIGH (ref <5)

## 2019-03-23 NOTE — Telephone Encounter (Signed)
Regarding: labs order verification  ----- Message from Levin Erp sent at 03/23/2019  2:22 PM EDT -----  [name]    Patient is at lab to get tests done.  She said all the orders were not there that she and Dr. Chrystine Oiler talked about.  She thought he wanted TSH, TSI and HCG.  Please call pt to advise.

## 2019-03-23 NOTE — Telephone Encounter (Signed)
Spoke with provider, lab order placed TSI, updated pt. Yassen Kinnett, RN  03/23/2019, 14:33

## 2019-03-24 LAB — THYROID STIMULATING IMMUNOGLOBULIN (TSI), SERUM: THYROID STIMULATING IMMUNOGLOBULIN (TSI), SERUM: 3.9 IU/L — ABNORMAL HIGH (ref <0.1)

## 2019-03-27 ENCOUNTER — Encounter (HOSPITAL_BASED_OUTPATIENT_CLINIC_OR_DEPARTMENT_OTHER): Payer: Self-pay | Admitting: INTERNAL MEDICINE-ENDOCRINOLOGY-DIABETES AND METABOLISM

## 2019-04-25 ENCOUNTER — Other Ambulatory Visit (HOSPITAL_BASED_OUTPATIENT_CLINIC_OR_DEPARTMENT_OTHER): Payer: Self-pay | Admitting: INTERNAL MEDICINE-ENDOCRINOLOGY-DIABETES AND METABOLISM

## 2019-04-25 ENCOUNTER — Encounter (HOSPITAL_BASED_OUTPATIENT_CLINIC_OR_DEPARTMENT_OTHER): Payer: Self-pay | Admitting: INTERNAL MEDICINE-ENDOCRINOLOGY-DIABETES AND METABOLISM

## 2019-04-25 DIAGNOSIS — E039 Hypothyroidism, unspecified: Secondary | ICD-10-CM

## 2019-05-12 ENCOUNTER — Encounter (HOSPITAL_BASED_OUTPATIENT_CLINIC_OR_DEPARTMENT_OTHER): Payer: Self-pay | Admitting: INTERNAL MEDICINE-ENDOCRINOLOGY-DIABETES AND METABOLISM

## 2019-05-12 IMAGING — NM NM RAI THERAPY FOR HYPERTHYROIDISM
1 series · 1 of 1 positions shown · non-contrast
Comparison: Thyroid uptake and scan 04/19/2017

CLINICAL DATA: Hyperthyroidism

EXAM:
RADIOACTIVE IODINE THERAPY FOR HYPERTHYROIDISM
TECHNIQUE: Procedure, benefits and risks of radioactive iodine therapy for
hyperthyroidism were discussed with the patient by Dr. Marcos.

[Series 1: bone statics · 2.07mm/px · 1 of 1 slices shown]
[im 1/1]
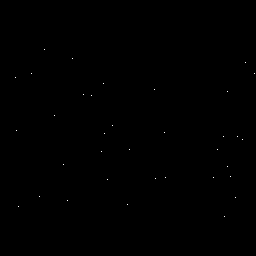

[1 of 1 positions shown; findings below may reference images not displayed]

Radiation safety issues and procedures were discussed, including
minimization of radiation risk to family and general public.

Written safety instructions were provided to the patient.

Patient's questions were answered.

Written informed consent for the therapy was obtained.

Timeout protocol followed.

Patient then received the radiopharmaceutical without immediate
complication.

RADIOPHARMACEUTICALS:  12.65 mCi 0-0D0 sodium iodide orally
IMPRESSION: Per oral administration of 0-0D0 sodium iodide for the treatment of
hyperthyroidism.

## 2019-05-15 ENCOUNTER — Other Ambulatory Visit (FREE_STANDING_LABORATORY_FACILITY): Payer: Self-pay | Admitting: Anatomic Pathology & Clinical Pathology

## 2019-05-15 ENCOUNTER — Encounter (HOSPITAL_BASED_OUTPATIENT_CLINIC_OR_DEPARTMENT_OTHER): Payer: Self-pay | Admitting: INTERNAL MEDICINE-ENDOCRINOLOGY-DIABETES AND METABOLISM

## 2019-05-15 ENCOUNTER — Encounter (FREE_STANDING_LABORATORY_FACILITY): Admit: 2019-05-15 | Discharge: 2019-05-15 | Disposition: A | Discharge status: Home / Self Care | Payer: Self-pay

## 2019-05-15 LAB — THYROID STIMULATING HORMONE (SENSITIVE TSH): TSH: 1.254 u[IU]/mL (ref 0.350–5.000)

## 2019-05-29 ENCOUNTER — Encounter (HOSPITAL_COMMUNITY): Payer: Self-pay | Admitting: Student in an Organized Health Care Education/Training Program

## 2019-05-29 ENCOUNTER — Ambulatory Visit (HOSPITAL_COMMUNITY): Payer: BC Managed Care – PPO

## 2019-05-29 ENCOUNTER — Ambulatory Visit
Payer: BC Managed Care – PPO | Attending: Student in an Organized Health Care Education/Training Program | Admitting: Student in an Organized Health Care Education/Training Program

## 2019-05-29 ENCOUNTER — Other Ambulatory Visit: Payer: Self-pay

## 2019-05-29 ENCOUNTER — Other Ambulatory Visit (HOSPITAL_COMMUNITY): Payer: Self-pay | Admitting: Student in an Organized Health Care Education/Training Program

## 2019-05-29 VITALS — BP 135/81 | HR 92 | Temp 98.0°F | Ht 67.0 in | Wt 209.4 lb

## 2019-05-29 DIAGNOSIS — E669 Obesity, unspecified: Secondary | ICD-10-CM

## 2019-05-29 DIAGNOSIS — R112 Nausea with vomiting, unspecified: Secondary | ICD-10-CM

## 2019-05-29 DIAGNOSIS — Z3490 Encounter for supervision of normal pregnancy, unspecified, unspecified trimester: Secondary | ICD-10-CM

## 2019-05-29 DIAGNOSIS — O99212 Obesity complicating pregnancy, second trimester: Secondary | ICD-10-CM

## 2019-05-29 DIAGNOSIS — E282 Polycystic ovarian syndrome: Secondary | ICD-10-CM | POA: Insufficient documentation

## 2019-05-29 DIAGNOSIS — Z8639 Personal history of other endocrine, nutritional and metabolic disease: Secondary | ICD-10-CM

## 2019-05-29 DIAGNOSIS — R7309 Other abnormal glucose: Secondary | ICD-10-CM

## 2019-05-29 DIAGNOSIS — O99280 Endocrine, nutritional and metabolic diseases complicating pregnancy, unspecified trimester: Secondary | ICD-10-CM | POA: Insufficient documentation

## 2019-05-29 DIAGNOSIS — O99210 Obesity complicating pregnancy, unspecified trimester: Secondary | ICD-10-CM | POA: Insufficient documentation

## 2019-05-29 DIAGNOSIS — Z3A00 Weeks of gestation of pregnancy not specified: Secondary | ICD-10-CM | POA: Insufficient documentation

## 2019-05-29 DIAGNOSIS — Z3A14 14 weeks gestation of pregnancy: Secondary | ICD-10-CM

## 2019-05-29 DIAGNOSIS — O99282 Endocrine, nutritional and metabolic diseases complicating pregnancy, second trimester: Secondary | ICD-10-CM

## 2019-05-29 LAB — GLUCOSE TOLERANCE TEST (GTT), 1 HOUR
GLUCOSE 1 HR POST DOSE: 144 mg/dL — ABNORMAL HIGH
WEIGHT OF DEXTROSE IN THE GLUCOLA GIVEN: 50 g

## 2019-05-29 NOTE — Patient Instructions (Signed)
Two tablets at bedtime on day 1 and 2; if symptoms persist, take 1 tablet in morning and 2 tablets at bedtime on day 3; if symptoms persist, may increase to 1 tablet in morning, 1 tablet mid-afternoon, and 2 tablets at bedtime on day 4 (maximum: doxylamine 40 mg/pyridoxine 40 mg (4 tablets) per day).    If Diclegis unavailable, one tablet of Diclegis is equivocal to doxylamine (Unisom) 10 mg and pyroxidine (Vitamin B6).

## 2019-05-29 NOTE — H&P (Signed)
[facility]    INITIAL OBSTETRICAL VISIT    PATIENT: [name]  CHART NUMBER: [id number]  DATE OF SERVICE: 05/29/2019      CC: Pregnancy    HPI: [name] is a 28 y.o. G2P1001 who presents to clinic for an initial obstetrical visit. The patient's LMP was 01/14/2019. This was a planned pregnancy. She does have nausea and vomiting. She reports fetal movement. She denies bleeding and cramping.   GYN HX:  Menarche: 12-13  LMP: 01/14/2019  Menses:    Frequency: Irregular   Duration: 6-7 days   Flow: Moderate  Hx of STDs: Denies  Last Pap: 04/10/2019 NILM  History of Abnormal pap: denies    REVIEW OF SYSTEMS:   Constitutional: No night sweats, weight loss or fevers.   Skin: No other worrisome moles or itching.   Eyes: No double vision or eye pain.  No discharge. No redness.   ENT:  No hearing loss. No dysphagia  Cardiac: No chest pain, shortness of breath, or orthopnea now. No palpitations or syncope.   Respiratory: No wheeze. No dyspnea on exertion. No cough. No shortness of breath.  GI: + nausea and vomiting. No diarrhea or constipation. No heartburn.  GU: No nocturia  No frequency, urgency, hematuria or dysuria.   Musculoskeletal:  No arthralgias or myalgias; otherwise no erythema or swelling to any joint.   Neuro: No double vision. No headaches that will not go away with Tylenol.       OBSTETRICAL HISTORY:  OB History   Gravida Para Term Preterm AB Living   SAB TAB Ectopic Multiple Live Births         0 1      # Outcome Date GA Lbr Len/2nd Weight Sex Delivery Anes PTL Lv   2 Current            1 Term 04/20/14 [redacted]w[redacted]d 21:30 / 00:13 2.58 kg (5 lb 11 oz) F Vag-Spont Local N LIV      Birth Comments: Born at 40 weeks via vaginal delivery. AGA. No prenatal or post natal complications. Breast feeding 20 minutes at a time per feed. Patient tolerating feeds well with regular stooling and multiple wet diapers. Hepatitis B vaccine was administered.  Patient passed Hearing screen and O2. Patient was discharged at 36 hours since patient was doing well and parents were instructed to bring child tomorrow to [facility] clinic for newborn screen. Patient otherwise will follow-up with [facility].       PAST MEDICAL HISTORY:  Past Medical History:   Diagnosis Date   . Graves disease    . PCOS (polycystic ovarian syndrome)    . Seasonal allergic reaction     occasional OTC meds     PAST SURGICAL HISTORY:  Past Surgical History:   Procedure Laterality Date   . HX WISDOM TEETH EXTRACTION  2010       FAMILY HISTORY:  Denies family history of bleeding/clotting disorders, chromosomal abnormalities, birth defects, inheritable diseases, and metabolism disorders.  Family Medical History:     Problem Relation (Age of Onset)    Eclampsia Sister    Hypertension (High Blood Pressure) Mother    Spont Ab Mother, Sister, Sister        SOCIAL HISTORY:  Social History     Socioeconomic History   . Marital status: Married     Spouse name: [name]   . Number of children: 0   .  Years of education: 916   . Highest education level: Not on file   Occupational History     Employer: [employer]     Comment: [employer]   Social Needs   . Financial resource strain: Not on file   . Food insecurity     Worry: Not on file     Inability: Not on file   . Transportation needs     Medical: Not on file     Non-medical: Not on file   Tobacco Use   . Smoking status: Never Smoker   . Smokeless tobacco: Never Used   Substance and Sexual Activity   . Alcohol use: No     Comment: occasionally once per week   . Drug use: No   . Sexual activity: Yes     Partners: Male   Lifestyle   . Physical activity     Days per week: Not on file     Minutes per session: Not on file   . Stress: Not on file   Relationships   . Social Wellsite geologistconnections     Talks on phone: Not on file     Gets together: Not on file     Attends religious service: Not on file     Active member of club or organization: Not on file     Attends meetings of  clubs or organizations: Not on file     Relationship status: Not on file   . Intimate partner violence     Fear of current or ex partner: Not on file     Emotionally abused: Not on file     Physically abused: Not on file     Forced sexual activity: Not on file   Other Topics Concern   . Abuse/Domestic Violence Not Asked   . Breast Self Exam Not Asked   . Caffeine Concern Not Asked   . Calcium intake adequate Not Asked   . Computer Use Not Asked   . Drives Not Asked   . Exercise Concern Not Asked   . Helmet Use Not Asked   . Seat Belt Not Asked   . Special Diet Not Asked   . Sunscreen used Not Asked   . Uses Cane Not Asked   . Uses walker Not Asked   . Uses wheelchair Not Asked   . Right hand dominant Not Asked   . Left hand dominant Not Asked   . Ambidextrous Not Asked   . Shift Work Not Asked   . Unusual Sleep-Wake Schedule Not Asked   Social History Narrative    Here to establish NOB care    Works at [employer]    Husband here at [employer] in cytology        Married    Planned    Happy        CURRENT MEDICATIONS:   Current Outpatient Medications   Medication Sig Dispense Refill   . levothyroxine (SYNTHROID) 112 mcg Oral Tablet Take one pill daily Monday through Saturday and 1/2 pill on Sunday. (Patient taking differently: Take one pill daily Monday through Saturday and 1 1/2 pill on Sunday.) 90 Tab 1   . PNV Combo #19-Iron-Fol Ac-DHA 22-6-1-200 mg Oral Capsule Take 1 Cap by mouth Once a day 30 Cap 5     No current facility-administered medications for this visit.        ALLERGIES:  Patient has no known allergies.     PHYSICAL EXAMINATION:   Vitals:  05/29/19 1320   BP: 135/81   Pulse: 92   Temp: 36.7 C (98 F)   SpO2: 99%   Weight: 95 kg (209 lb 6.4 oz)   Height: 1.702 m (5\' 7" )   BMI: 32.87     General: Alert and Oriented x3, no acute distress  HEENT: NC/AT, PERRLA, EOMI, neck supple, no thyromegaly.  Heart: S1/S2 audible, RRR, no murmurs, rubs, or gallops  Chest: CTA b/l, no rales, rhonchi, or wheezing  Abd: soft,  NT/ND, NABS, no HSM.  Ext: no clubbing, cyanosis, or edema.  Neuro: CN's intact, no focal deficits.    Breast: Deferred  Gyn: Deferred      FHT: 145s  FH: CWD        ASSESSMENT/PLAN: 28 y.o. G2P1001 at [redacted]w[redacted]d by [redacted]w[redacted]d ultrasound for new OB visit.    Patient Active Problem List    Diagnosis Date Noted   . Prenatal care 05/29/2019     TRANSFER FROM DR. Milon Score AT [redacted]W[redacted]D    FOB: Jenny Reichmann (Husband)  Gender:   Occupation: Document Processor at [employer]    Screening:  Pap: 03/28/2019 NILM    Initial prenatal labs:  Date: 03/30/2019  RH status: A NEGATIVE  H/H: 13.6/39.8  Rubella: Immune  Varicella: Ordered  HIV: Negative  Hep B: Negative  RPR: Non-reactive  Hep C: Negative  UDS: Negative  GC/CT: Negative    Genetic screening:  First trimester: Declines  AFP: second trimester  Horizon carrier screening: Declines  Fetal assessment scan: Plan for level II ultrasound    28 week labs:  Date:  H/H:    1 hr gtt:   3 hr GTT if indicated       Immunizations:  Tdap: to be given around 28 weeks   Flu: +  Rhogam administration: If indicated    Other:  GBS: to be collected around 36 weeks  Date of discussion of breastfeeding and plan:   Date of postpartum contraception discussion and plan:  Pain control in labor:  Circumcision if boy:      . History of Graves' disease  Hypothyroidism 05/29/2019     S/p ablation in 2018  Follows with Dr. Chrystine Oiler at [facility]  10/15 TSI: 3.9  12/7 TSH: 1.254  Current Regimen: Synthroid 112 mcg x 6 days and 168 mg x 1 day  TSH every 4-6 weeks  Per plan from previous MFM at [facility], plan for TSI monitoring throughout pregnancy, level 2 ultrasound, and antenatal testing.   Will refer to MFM at [facility] to confirm plan     . Obesity  PCOS 05/29/2019     Body mass index is 32.8 kg/m.  Early 1 hour gtt ordered       1. Reviewed prenatal labs. Additional labs ordered including Varicella and 1 hour gtt.   2. Per records, previous MFM planned for level 2 ultrasound, TSI monitoring and antenatal testing. Will refer to MFM  for level 2 ultrasound and to confirm necessity of testing.  3. Discussed genetic screening. Patient declines.   4. Reviewed nausea in pregnancy. Discussed ginger, small frequent meals, Vitamin B6, and Unisom/Benadryl.   5. Signs/symptoms of hyperemesis reviewed.  6. Bleeding precautions reviewed.  7. Counseled patient regarding food safety, diet, weight gain, exercise, caffeine consumption and avoidance of substances/exposures.  8. Discussed what to expect in pregnancy including labs, imaging, and follow up.    Follow up appointment in approximately 4 weeks.        [name], MD 05/29/2019  13:35  Department of Obstetrics & Gynecology

## 2019-05-29 NOTE — Progress Notes (Signed)
[name] is a 28 y.o. G2P1001 at [redacted]w[redacted]d whose early 1 hour gtt was elevated at 144. Hemoglobin A1c and 3 hour gtt ordered.      [name], MD 05/29/2019 17:25  Department of Obstetrics & Gynecology

## 2019-05-30 ENCOUNTER — Encounter (INDEPENDENT_AMBULATORY_CARE_PROVIDER_SITE_OTHER): Payer: Self-pay

## 2019-05-30 DIAGNOSIS — O0990 Supervision of high risk pregnancy, unspecified, unspecified trimester: Secondary | ICD-10-CM

## 2019-05-30 LAB — VARICELLA-ZOSTER ANTIBODY, IGG, SERUM: VZV IGG QUALITATIVE: POSITIVE

## 2019-05-30 NOTE — Nursing Note (Signed)
Request for High Risk OB consult from: Dr. Jaquelyn Bitter     Reason for request: Hx Grave's disease, s/p ablation     Age: 28    Gravida/Para: G2P1     Best EDD: 11/21/19    Current gestational age on: 05/30/19- [redacted]w[redacted]d          Chart information:   [name] was referred by Dr. Jaquelyn Bitter for hx Grave's disease, s/p ablation.      Hx Grave's disease:    Dr. Jaquelyn Bitter states pt was seen by previous MFM in [facility] who advised patient have TSI levels checked and antenatal testing due to hx Grave's disease; wanted to confirm this plan/confirm necessity of testing       S/p ablation:    In 2018      PMH:    Hx SVD x1:    G1, 2015    PCOS     Labs, imaging in Epic for review     Schedule:   O MFM consult  O Genetic consult  O Fetal ultrasound      O   singleton    O  Twins   O Triplets   O  _______  O < 14 week   O Nuchal translucency  O Limited  O Anatomy   O Growth (follow up)  O UA Doppler  O MCA Doppler  O Fetal echocardiogram  O Maternal echocardiogram  O Diabetes Education  O Other _______________________________

## 2019-06-03 ENCOUNTER — Encounter (HOSPITAL_COMMUNITY): Payer: Self-pay | Admitting: Student in an Organized Health Care Education/Training Program

## 2019-06-03 ENCOUNTER — Encounter (HOSPITAL_BASED_OUTPATIENT_CLINIC_OR_DEPARTMENT_OTHER): Payer: Self-pay | Admitting: INTERNAL MEDICINE-ENDOCRINOLOGY-DIABETES AND METABOLISM

## 2019-06-05 ENCOUNTER — Encounter (HOSPITAL_COMMUNITY): Payer: Self-pay | Admitting: Student in an Organized Health Care Education/Training Program

## 2019-06-05 ENCOUNTER — Other Ambulatory Visit (HOSPITAL_BASED_OUTPATIENT_CLINIC_OR_DEPARTMENT_OTHER): Payer: Self-pay | Admitting: INTERNAL MEDICINE-ENDOCRINOLOGY-DIABETES AND METABOLISM

## 2019-06-05 DIAGNOSIS — E039 Hypothyroidism, unspecified: Secondary | ICD-10-CM

## 2019-06-08 ENCOUNTER — Encounter (HOSPITAL_BASED_OUTPATIENT_CLINIC_OR_DEPARTMENT_OTHER): Payer: Self-pay | Admitting: INTERNAL MEDICINE-ENDOCRINOLOGY-DIABETES AND METABOLISM

## 2019-06-08 ENCOUNTER — Telehealth (HOSPITAL_BASED_OUTPATIENT_CLINIC_OR_DEPARTMENT_OTHER): Payer: Self-pay | Admitting: INTERNAL MEDICINE-ENDOCRINOLOGY-DIABETES AND METABOLISM

## 2019-06-08 ENCOUNTER — Encounter (HOSPITAL_COMMUNITY): Payer: Self-pay | Admitting: Student in an Organized Health Care Education/Training Program

## 2019-06-08 NOTE — Telephone Encounter (Signed)
Patient called into clinic. Stated she threw up immediately after taking levothyroxine pill due to pregnancy morning sickness. Advised to take 2 pills tomorrow, as she would receive same amount of weekly dose this way. Patient advised to call back if she continues to throw up after taking medication. Patient verbalized understanding. Caroline Sauger, RN  06/08/2019, 08:47

## 2019-06-09 NOTE — L&D Delivery Note (Signed)
[facility]    Delivery Summary Note      Name: Charnay Nazario Kogler   MRN: [id number]  DOB:  Mar 27, 1991  Admitted: 11/01/2019  8:52 AM       Pre-delivery diagnosis:    1. 29 y.o. G2P1001 at [redacted]w[redacted]d gestation.   2. Gestational hypertension    Post-delivery diagnosis:    1. Same plus delivery of a viable neonate.     Delivery note:           Patient is a 29 year old gravida 2 now para 2002 admitted at 37+ [redacted] weeks EGA for induction of labor secondary to gestational hypertension.  Patient underwent Cervidil followed by Pitocin induction and progressed to complete/complete/+1.  Patient then pushed to deliver head OA.  Anterior (right) shoulder immediately delivered without difficulty.  Posterior shoulder and body followed easily.  Infant placed on maternal abdomen.  Following delay, cord doubly clamped and cut.  Placenta then delivered spontaneously and intact.  Small second-degree midline laceration noted and repaired with 2 0 Vicryl in usual fashion using 6 cc of 1% lidocaine without epinephrine.  No other lacerations noted.  EBL 250 cc.  Fundus firm at U -3. Weight and Apgars currently pending.    Dr. Lisette Grinder was present and participated in the entire delivery.    Disposition:  Infant(s) admitted to Newborn Nursery.    "Delivery Information"    IO Blood Loss  11/02/19 0253 - 11/02/19 1453    None               Vincente Poli, MD 11/02/2019, 14:53

## 2019-06-12 ENCOUNTER — Encounter (FREE_STANDING_LABORATORY_FACILITY)
Admit: 2019-06-12 | Discharge: 2019-06-12 | Disposition: A | Discharge status: Home / Self Care | Payer: BC Managed Care – PPO | Attending: INTERNAL MEDICINE-ENDOCRINOLOGY-DIABETES AND METABOLISM | Admitting: INTERNAL MEDICINE-ENDOCRINOLOGY-DIABETES AND METABOLISM

## 2019-06-12 ENCOUNTER — Other Ambulatory Visit: Payer: Self-pay

## 2019-06-12 ENCOUNTER — Ambulatory Visit: Payer: BC Managed Care – PPO | Attending: INTERNAL MEDICINE-ENDOCRINOLOGY-DIABETES AND METABOLISM

## 2019-06-12 DIAGNOSIS — R7309 Other abnormal glucose: Secondary | ICD-10-CM

## 2019-06-12 DIAGNOSIS — E039 Hypothyroidism, unspecified: Secondary | ICD-10-CM | POA: Insufficient documentation

## 2019-06-12 LAB — GLUCOSE 1 HOUR POST DOSE: GLUCOSE 1 HR POST DOSE: 153 mg/dL

## 2019-06-12 LAB — GLUCOSE 2 HOUR POST DOSE: GLUCOSE 2 HRS POST  DOSE: 130 mg/dL (ref 70–139)

## 2019-06-12 LAB — GLUCOSE 3 HOUR POST DOSE: GLUCOSE 3 HRS POST  DOSE: 99 mg/dL

## 2019-06-12 LAB — GLUCOSE FASTING FOR GTT: GLUCOSE FASTING FOR GTT: 92 mg/dL (ref 70–99)

## 2019-06-13 ENCOUNTER — Other Ambulatory Visit (INDEPENDENT_AMBULATORY_CARE_PROVIDER_SITE_OTHER): Payer: Self-pay

## 2019-06-13 ENCOUNTER — Institutional Professional Consult (permissible substitution) (INDEPENDENT_AMBULATORY_CARE_PROVIDER_SITE_OTHER): Payer: Self-pay

## 2019-06-13 LAB — HGA1C (HEMOGLOBIN A1C WITH EST AVG GLUCOSE)
ESTIMATED AVERAGE GLUCOSE: 94 mg/dL
HEMOGLOBIN A1C: 4.9 % (ref 4.0–5.6)

## 2019-06-13 LAB — THYROID STIMULATING IMMUNOGLOBULIN (TSI), SERUM: THYROID STIMULATING IMMUNOGLOBULIN (TSI), SERUM: 2.1 IU/L — ABNORMAL HIGH (ref <0.1)

## 2019-06-19 ENCOUNTER — Ambulatory Visit: Payer: BC Managed Care – PPO | Attending: Maternal & Fetal Medicine

## 2019-06-19 ENCOUNTER — Encounter (INDEPENDENT_AMBULATORY_CARE_PROVIDER_SITE_OTHER): Payer: Self-pay

## 2019-06-19 ENCOUNTER — Ambulatory Visit (HOSPITAL_BASED_OUTPATIENT_CLINIC_OR_DEPARTMENT_OTHER): Payer: BC Managed Care – PPO

## 2019-06-19 ENCOUNTER — Other Ambulatory Visit: Payer: Self-pay

## 2019-06-19 DIAGNOSIS — E0500 Thyrotoxicosis with diffuse goiter without thyrotoxic crisis or storm: Secondary | ICD-10-CM | POA: Insufficient documentation

## 2019-06-19 DIAGNOSIS — O99282 Endocrine, nutritional and metabolic diseases complicating pregnancy, second trimester: Secondary | ICD-10-CM

## 2019-06-19 DIAGNOSIS — O0992 Supervision of high risk pregnancy, unspecified, second trimester: Secondary | ICD-10-CM

## 2019-06-19 DIAGNOSIS — Z3A17 17 weeks gestation of pregnancy: Secondary | ICD-10-CM

## 2019-06-19 DIAGNOSIS — O0990 Supervision of high risk pregnancy, unspecified, unspecified trimester: Secondary | ICD-10-CM

## 2019-06-19 DIAGNOSIS — Z8639 Personal history of other endocrine, nutritional and metabolic disease: Secondary | ICD-10-CM

## 2019-06-19 NOTE — Progress Notes (Signed)
[name]stetrics & Gynecology    Maternal Fetal Medicine Consultation    PATIENT: [name]  CHART NUMBER: [id number]  DATE OF SERVICE: 06/19/2019  Referring provider: Dagoberto Ligas, MD  [address]  [place],  [place] [address]    Chief Complaint: Graves disease s/p radioablation of thyroid     HPI: [name] is a 29 y.o. G2P1001 at [redacted]w[redacted]d who presents [facility] for Maternal-Fetal Medicine Consultation. Her EDC = 11/21/2019 based on 6 week ultrasound, not consistent with LMP. This is a planned and desired pregnancy.     In 2018 she was diagnosed with Grave's disease. She was never treated with PTU or methimazole but did undergo radioablation of her thyroid. Since that time she has been on levothyroxine for her hypothyroidism, but continues to have TSI antibodies. She is followed by Dr. Ronny Flurry at [facility].     Overall today she is feeling well. She denies contractions, vaginal bleeding, or leakage of fluids.     OBSTETRICAL HISTORY:  OB History     Gravida   2    Para   1    Term   1    Preterm        AB        Living   1       SAB        TAB        Ectopic        Multiple   0    Live Births   1               Term 04/20/14 [redacted]w[redacted]d 21h 74m / 0h 35m 2.58 kg (5 lb 11 oz) F Vag-Spont Local N Living 7 9    Name: [name]   Birth Comments: Born at 40 weeks via vaginal delivery. AGA. No prenatal or post natal complications.         She denies a history of ectopic pregnancy, miscarriage, or abortion.    Gynecological History: She denies a history of sexually transmitted infections or abnormal pap smears. Carries a diagnosis of PCOS but conceived spontaneously.     REVIEW OF SYSTEMS: 10 point review of systems was obtained as is negative except for the HPI.    PAST MEDICAL HISTORY:  Past Medical History:   Diagnosis Date   . Graves disease    . PCOS (polycystic ovarian syndrome)    . Seasonal allergic reaction     occasional OTC meds      PAST SURGICAL HISTORY:  Past  Surgical History:   Procedure Laterality Date   . HX WISDOM TEETH EXTRACTION  2010   She denies a history of anesthetic complications    FAMILY HISTORY:  Family Medical History:     Problem Relation (Age of Onset)    Eclampsia Sister    Hypertension (High Blood Pressure) Mother    Spont Ab Mother, Sister, Sister      She denies a family history of congenital anomalies and intellectual disabilities on both her and the father's sides of the families.    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 She denies alcohol, tobacco, and drug use.     CURRENT MEDICATIONS:  Current Outpatient Medications   Medication Sig Dispense Refill   . levothyroxine (SYNTHROID) 112 mcg Oral Tablet Take one pill daily Monday through Saturday and 1/2 pill on Sunday. (  Patient taking differently: Take one pill daily Monday through Saturday and 1 1/2 pill on Sunday.) 90 Tab 1   . PNV Combo #19-Iron-Fol Ac-DHA 22-6-1-200 mg Oral Capsule Take 1 Cap by mouth Once a day 30 Cap 5     ALLERGIES:  Patient has no known allergies.    PHYSICAL EXAMINATION:  Vitals:    06/19/19 1311   BP: 134/85   Pulse: 100   Temp: 36.9 C (98.4 F)   SpO2: 98%   Weight: 95.4 kg (210 lb 5.1 oz)   Constitutional: well-nourish, well-appearing, NAD  Ear, nose, mouth and throat: normocephalic and atraumatic, external ears normal appearing   Eyes: no conjunctival or scleral icterus, pupils equal and round  Neck: no visible goiter, trachea midine ROM appears WNL  CV: appears well-perfused  Respiratory: non-labored breathing, no audible wheezes or cough   Abd: Gravid   Ext: No edema BLE   Skin: no visible rashes, no jaundice  Psychiatric: non-anxious, normal affect and speech  Neuro: CN II-XII grossly intact     PERTINENT LAB RESULTS: TS1 7.9 --> 3.9 --> 2.1     PERTINENT IMAGING RESULTS: Fetal ultrasound 06/19/19            Single viable IUP in variable presentation            Placenta: anterior without evidence of previa           Amniotic fluid volume: normal           Composite fetal biometry: consistent with  established dates           EFW: 247 grams, which plots at the 87th  percentile           Anatomic survey: complete           Anomalies: None identified           Limitations: maternal body habitus, early gestational age           Abnormal findings: none appreciated    ASSESSMENT/PLAN: 29 y.o. G2P1001 at [redacted]w[redacted]d here for MFM consultation due to Grave's disease in pregnancy.     We we reviewed that thyroid disease in pregnancy is associated with maternal and fetal risks that include, but are not limited to, preeclampsia, intellectual disabilities of her offspring, fetal growth restriction, preterm birth, and pregnancy loss. She is in a unique situation in that she is hypothyroid after ablation but she sill has antibodies that can cross the placenta and , rarely, lead to fetal hyperthyroidism including tachycardia, goiter, and hydrops fetalis. Due to these risks we recommended continued control of her hypothyroidism with levothyroxine (to be managed by Dr. Ronny Flurry). She was counseled that she could expect increases in her levothryoxine levels as pregnancy progressed due to increase in bidnding proteins. We also discussed a recommendation for fetal surveillance, first with heart rate checks at each office visit (as tachycardia may be an initial sign of fetal hyperthyroidism), as well as serial growth ultrasounds at 28. 32. and [redacted] weeks gestation, and weekly antenatal testing starting at 32 weeks of gestation with referral to [facility]FM should any concerns arise. If not, we recommend a local delivery at 39-40 weeks with Dr. Ezequiel Kayser with cesarean reserved for the usual OB indications.     Christiane Ha Golembeski will continue routine prenatal care with Dr. Ezequiel Kayser and endocrinology care with Dr. Ronny Flurry. She has elected to have [facility] do her growth ultrasound at 28, 32,  and [redacted] weeks gestation. Dr. Ezequiel Kayser  will start weekly antenatal testing at 32 weeks of gestation in her office and deliver her locally unless any new concerns arise.     Midge Minium, MD

## 2019-06-26 ENCOUNTER — Ambulatory Visit (HOSPITAL_COMMUNITY): Payer: BC Managed Care – PPO

## 2019-06-26 ENCOUNTER — Other Ambulatory Visit: Payer: Self-pay

## 2019-06-26 ENCOUNTER — Encounter (HOSPITAL_BASED_OUTPATIENT_CLINIC_OR_DEPARTMENT_OTHER): Payer: Self-pay | Admitting: INTERNAL MEDICINE-ENDOCRINOLOGY-DIABETES AND METABOLISM

## 2019-06-26 ENCOUNTER — Ambulatory Visit
Payer: BC Managed Care – PPO | Attending: Student in an Organized Health Care Education/Training Program | Admitting: Student in an Organized Health Care Education/Training Program

## 2019-06-26 DIAGNOSIS — O99212 Obesity complicating pregnancy, second trimester: Secondary | ICD-10-CM

## 2019-06-26 DIAGNOSIS — Z8639 Personal history of other endocrine, nutritional and metabolic disease: Secondary | ICD-10-CM

## 2019-06-26 DIAGNOSIS — E039 Hypothyroidism, unspecified: Secondary | ICD-10-CM

## 2019-06-26 DIAGNOSIS — Z3A18 18 weeks gestation of pregnancy: Secondary | ICD-10-CM

## 2019-06-26 DIAGNOSIS — Z3490 Encounter for supervision of normal pregnancy, unspecified, unspecified trimester: Secondary | ICD-10-CM

## 2019-06-26 DIAGNOSIS — O99282 Endocrine, nutritional and metabolic diseases complicating pregnancy, second trimester: Secondary | ICD-10-CM

## 2019-06-26 DIAGNOSIS — E669 Obesity, unspecified: Secondary | ICD-10-CM

## 2019-06-26 LAB — THYROID STIMULATING HORMONE (SENSITIVE TSH): TSH: 1.14 u[IU]/mL (ref 0.340–5.330)

## 2019-06-26 LAB — THYROXINE, FREE (FREE T4): THYROXINE (T4), FREE: 0.86 ng/dL (ref 0.61–1.12)

## 2019-06-26 LAB — TRIIODOTHYRONINE, TOTAL (TOTAL T3): T3 TOTAL: 123 ng/dL (ref 87–178)

## 2019-06-26 NOTE — Progress Notes (Signed)
[facility]      DATE OF SERVICE: 06/26/2019, 09:21   PATIENT: [name]  CHART NUMBER: [id number]    Subjective:  29 y.o. G2P1001 at [redacted]w[redacted]d presenting for [place] visit.   Patient is doing well. She has no complaints today. She reports good fetal movement. She denies contractions, vaginal bleeding, leaking of fluid, chest pain, shortness of breath, headache, vision changes, nausea, vomiting, abdominal pain, and edema.      Dating Summary      Working EDD: 11/21/19 set by Jearld Shines, Ambulatory Care Assistant on 05/29/19 based on Ultrasound on 03/28/19          Based On EDD GA Diff GA User Date    Last Menstrual Period on 01/14/19 (Exact Date)   10/21/19 +[redacted]w[redacted]d  System action - copied 05/29/19    Ultrasound on 03/28/19   11/21/19 Working [redacted]w[redacted]d Jearld Shines, Ambulatory Care Assistant 05/29/19             Objective:    Weight: 96.9 kg (213 lb 9.6 oz)  BP (Non-Invasive): 117/72  Protein: Negative  Glucose: Negative  Fundal height: At umbilicus  Preterm Labor: None  Fetal Movement: Present  Edema: Negative  FHR (1): 145s    A/P:  29 y.o. G2P1001 at [redacted]w[redacted]d for ROB      Patient Active Problem List    Diagnosis Date Noted   . Prenatal care 05/29/2019     TRANSFER FROM DR. Milon Score AT [redacted]W[redacted]D    FOB: [name] (Husband)  Gender: Female  Occupation: Barista at [employer]    Screening:  Pap: 03/28/2019 NILM    Initial prenatal labs:  Date: 03/30/2019  RH status: A NEGATIVE  H/H: 13.6/39.8  Rubella: Immune  Varicella: Immune  HIV: Negative  Hep B: Negative  RPR: Non-reactive  Hep C: Negative  UDS: Negative  GC/CT: Negative    Genetic screening:  First trimester: Declines  AFP: Declines  Horizon carrier screening: Declines  Fetal assessment scan:   1/11/20021 mobile, anterior, AFI WNL, EFW: 247 g, 87th percentile, unremarkable anatomy    28 week labs:  Date:   H/H:    1 hr gtt:   3 hr GTT if indicated       Immunizations:  Tdap: to be given  around 28 weeks   Flu: +  Rhogam administration: If indicated    Other:  GBS: to be collected around 36 weeks  Date of discussion of breastfeeding and plan:   Date of postpartum contraception discussion and plan:  Pain control in labor:  Circumcision if boy: N/A     . History of Graves' disease  Hypothyroidism 05/29/2019     S/p ablation in 2018  Follows with Dr. Chrystine [facility]iler at [facility]  10/15 TSI: 3.9  12/7 TSH: 1.254  Current Regimen: Synthroid 112 mcg x 6 days and 168 mg x 1 day  TSH every 4-6 weeks. Patient to complete today  S/p MFM consult   Plan for growth [place] at 28, 109, and 36 weeks. All to be completed at [facility].   Start weekly antenatal testing at 50 weeks.    Plan for delivery at 39-[redacted] weeks gestation     . Obesity  PCOS 05/29/2019     Body mass index is 32.8 kg/m.  Early 1 hour gtt: 144  06/12/2019: 3 hour--92, 153, 130, 99   Hemoglobin A1c: 4.9  Repeat 3 hour gtt at 28 weeks  1. Reviewed MFM consultation and recommendations  2. Discussed AFP screening. Patient declines.  3. Advise patient to have TSH lab completed today  4. Preterm labor precautions reviewed.    RTC 4 weeks    Dema Severin, MD 06/26/2019 09:21  Department of Obstetrics & Gynecology

## 2019-06-27 ENCOUNTER — Encounter (HOSPITAL_COMMUNITY): Payer: Self-pay | Admitting: Student in an Organized Health Care Education/Training Program

## 2019-07-05 ENCOUNTER — Ambulatory Visit
Payer: BC Managed Care – PPO | Attending: INTERNAL MEDICINE-ENDOCRINOLOGY-DIABETES AND METABOLISM | Admitting: INTERNAL MEDICINE-ENDOCRINOLOGY-DIABETES AND METABOLISM

## 2019-07-05 ENCOUNTER — Other Ambulatory Visit: Payer: Self-pay

## 2019-07-05 DIAGNOSIS — Z3A20 20 weeks gestation of pregnancy: Secondary | ICD-10-CM | POA: Insufficient documentation

## 2019-07-05 DIAGNOSIS — Z79890 Hormone replacement therapy (postmenopausal): Secondary | ICD-10-CM | POA: Insufficient documentation

## 2019-07-05 DIAGNOSIS — E032 Hypothyroidism due to medicaments and other exogenous substances: Secondary | ICD-10-CM

## 2019-07-05 DIAGNOSIS — O99282 Endocrine, nutritional and metabolic diseases complicating pregnancy, second trimester: Secondary | ICD-10-CM | POA: Insufficient documentation

## 2019-07-05 DIAGNOSIS — E039 Hypothyroidism, unspecified: Secondary | ICD-10-CM

## 2019-07-05 DIAGNOSIS — E890 Postprocedural hypothyroidism: Secondary | ICD-10-CM | POA: Insufficient documentation

## 2019-07-05 NOTE — Progress Notes (Signed)
[facility]  Virtual visit      Name: [name]  MRN: [id number]  DOB: 26-Aug-1990    Date of Service:  07/05/2019    Last visit in August 2020.    TELEMEDICINE DOCUMENTATION:  Patient Location:  MyChart video visit from home address: [address]  [place] [address]   Patient/family aware of provider location:  yes  Patient/family consent for telemedicine:  yes  Examination observed and performed by:  Rose Phi, MD    Problem List:     1.  Post radioactive iodine ablation hypothyroidism. (December 2018)  2.  Second Trimester pregnant.        Expected date November 22, 2019.       History of Present Illness:      [name] is a 29 y.o. female who presents today for follow up of post radioactive iodine ablation hypothyroidism. Patient is currently [redacted] weeks pregnant with expected due date of June 16th. Reports that recent pregnancy scan was good. She will have weekly monitoring starting on week 32 and at least 3 more scans. She is still taking 112 mcg of levothyroxine 1 pill x6 days and 1.5 pills on "Sunday. Taking medication in the morning on an empty stomach. Taking prenatals at night. Patient is planning to breastfeed. She is not planning to start contraceptive immediately after delivery. Reports that she's feeling okay with occasional morning sickness. Reports current weight as 214 lbs.     Last Plan:       Levothyroxine 112 mcg x 6 days and 1.5 pill on Day 7.  -Continue levothyroxine 112 mcg 6 days a week and half a pill on day 7.   -Patient's TSI antibodies are elevated 7.1.  -Patient's gynecology need to know this information to monitor fetal heart rate and thyroid during pregnancy.      Review:     20"  weeks pregnant. Expected delivery 11/22/18.  Taking levothyroxine 112 mcg 1 pill x6 days and 1.5 on Sundays.   Taking every morning on an empty stomach. Denies missing doses.   Weight is stable, 214 lbs.  Reports occasional morning sickness.   Planning to breastfeed. Patient  does not plan to use hormonal contraception after delivery.     Last TSH: 1.14 ( 06/26/2019)     Following high risk pregnancy. Fetal heart rate monitoring is reassuring.        Data Reviewed:     Labs from 06/25/18:  TSH: 1.14    From 06/12/19  TSI: 2.1    From 05/15/19:  TSH: 1.245    Labs from 03/23/19:  TSH: 1.815  HCG quantitative pregnancy: 7,597  TSI: 3.9    Results for [name] (MRN [id number]) as of 01/28/2019 19:27   Ref. Range 01/04/2019 14:19   TSH Latest Ref Range: 0.350 - 5.000 uIU/mL 1.073   THYROXINE, FREE (FREE T4) Latest Ref Range: 0.70 - 1.25 ng/dL 1.13     Results for [name] (MRN [id number]) as of 01/28/2019 19:27   Ref. Range 11/02/2018 10:31   17-HYDROXYPROGESTERONE, SERUM Latest Units: ng/dL 166   DEHYDROEPIANDROSTERONE (DHEA), SERUM Latest Ref Range: <13 ng/mL 8.5       Patient Active Problem List    Diagnosis   . Prenatal care   . History of Graves' disease  Hypothyroidism   . Obesity  PCOS     Past Medical History:   Diagnosis Date   . Graves disease    . PCOS (polycystic  ovarian syndrome)    . Seasonal allergic reaction     occasional OTC meds         Past Surgical History:   Procedure Laterality Date   . HX WISDOM TEETH EXTRACTION  2010         Current Outpatient Medications   Medication Sig   . levothyroxine (SYNTHROID) 112 mcg Oral Tablet Take one pill daily Monday through Saturday and 1/2 pill on Sunday. (Patient taking differently: Take one pill daily Monday through Saturday and 1 1/2 pill on Sunday.)   . PNV Combo #19-Iron-Fol Ac-DHA 22-6-1-200 mg Oral Capsule Take 1 Cap by mouth Once a day     No Known Allergies  Family Medical History:     Problem Relation (Age of Onset)    Eclampsia Sister    Hypertension (High Blood Pressure) Mother    Spont Ab Mother, Sister, Sister            Social History     Tobacco Use   . Smoking status: Never Smoker   . Smokeless tobacco: Never Used   Substance Use Topics   . Alcohol use: No     Comment: occasionally  once per week       Review of Systems:  Constitutional: negative   Denies tachycardia, tremors, weight loss.   Denies heat or cold intolerance.   Reports hair thinning and hair loss in the last 6 months.   Denies diarrhea.     Physical Exam:    Physical exam not because this was a virtual visit.       Assessment/Plan:    1. Post RAI ablation hypothyroidism.     -[redacted] weeks pregnant. Expected due date is June 16th.     -TSH by goals:   During 1st trimester TSH target 0.1- to 2   During 2nd trimester TSH target 0.2 to 2   During her trimester TSH target is 0.3 to 3    -Continue levothyroxine 112 mcg 6 days a week and half a pill on day 7.   - Following TSI at 28 weeks, because of her previous history of Graves disease. TSI antibodies can cross placenta and can cause fetal goiter. Will require frequent monitoring of fetal heart rate and monitoring.     -Last TSH on 06/25/18 was 1.14.     -Repeat TSH during 3rd trimester and 6 weeks post delivery.  -Repeat TSI in 2 months.      Total time spent 15 minutes.     Return in 7 months.       I am scribing for, and in the presence of, Dr. Ronny Flurry for services provided on 07/05/2019.  Ambrose Pancoast, SCRIBE       I personally performed the services described in this documentation, as scribed  in my presence, and it is both accurate  and complete.    Ardelle Park, MD   Assistant Professor    Endocrinology and [facility] Medicine  [phone]

## 2019-07-17 ENCOUNTER — Encounter (HOSPITAL_COMMUNITY): Payer: Self-pay | Admitting: Student in an Organized Health Care Education/Training Program

## 2019-07-17 ENCOUNTER — Encounter (INDEPENDENT_AMBULATORY_CARE_PROVIDER_SITE_OTHER): Payer: Self-pay

## 2019-07-17 DIAGNOSIS — O0990 Supervision of high risk pregnancy, unspecified, unspecified trimester: Secondary | ICD-10-CM

## 2019-07-24 ENCOUNTER — Telehealth (HOSPITAL_COMMUNITY): Payer: Self-pay | Admitting: OBSTETRICS/GYNECOLOGY

## 2019-07-24 NOTE — Telephone Encounter (Signed)
Script for breast pump written and given to office staff to FAX.

## 2019-07-27 ENCOUNTER — Other Ambulatory Visit: Payer: Self-pay

## 2019-07-27 ENCOUNTER — Encounter (HOSPITAL_COMMUNITY): Payer: Self-pay | Admitting: Student in an Organized Health Care Education/Training Program

## 2019-07-27 ENCOUNTER — Ambulatory Visit
Payer: 59 | Attending: Student in an Organized Health Care Education/Training Program | Admitting: Student in an Organized Health Care Education/Training Program

## 2019-07-27 VITALS — BP 137/87 | HR 112 | Temp 98.8°F | Wt 219.6 lb

## 2019-07-27 DIAGNOSIS — O99283 Endocrine, nutritional and metabolic diseases complicating pregnancy, third trimester: Secondary | ICD-10-CM

## 2019-07-27 DIAGNOSIS — Z8639 Personal history of other endocrine, nutritional and metabolic disease: Secondary | ICD-10-CM

## 2019-07-27 DIAGNOSIS — E039 Hypothyroidism, unspecified: Secondary | ICD-10-CM

## 2019-07-27 DIAGNOSIS — O99213 Obesity complicating pregnancy, third trimester: Secondary | ICD-10-CM

## 2019-07-27 DIAGNOSIS — E282 Polycystic ovarian syndrome: Secondary | ICD-10-CM

## 2019-07-27 NOTE — Progress Notes (Signed)
[facility]    RETURN OBSTETRICAL ENCOUNTER      DATE OF SERVICE: 07/27/2019, 09:00   PATIENT: [name]  CHART NUMBER: [id number]    Subjective:  29 y.o. G2P1001 at [redacted]w[redacted]d presenting for [place] [facility] visit.   Patient is doing well. She reports continued nausea. She states that she actually feels better once she vomits. She reports a headache the past couple days but has not tried anything. She reports checking her BP at home which was normal. She reports good fetal movement. She denies contractions, vaginal bleeding, leaking of fluid, chest pain, shortness of breath, vision changes, nausea, vomiting, abdominal pain, and edema.      Dating Summary      Working EDD: 11/21/19 set by Jearld Shines, Ambulatory Care Assistant on 05/29/19 based on Ultrasound on 03/28/19          Based On EDD GA Diff GA User Date    Last Menstrual Period on 01/14/19 (Exact Date)   10/21/19 +[redacted]w[redacted]d  System action - copied 05/29/19    Ultrasound on 03/28/19   11/21/19 Working [redacted]w[redacted]d Jearld Shines, Ambulatory Care Assistant 05/29/19             Objective:    Weight: 99.6 kg (219 lb 9.6 oz)  BP (Non-Invasive): 137/87  Protein: Negative  Glucose: Negative  Fundal height: 24  Preterm Labor: None  Fetal Movement: Present  Edema: Negative  FHR (1): 140s    A/P:  29 y.o. G2P1001 at [redacted]w[redacted]d for ROB      Patient Active Problem List    Diagnosis Date Noted   . Prenatal care 05/29/2019     TRANSFER FROM DR. 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    Screening:  Pap: 03/28/2019 NILM    Initial prenatal labs:  Date: 03/30/2019  RH status: A NEGATIVE  H/H: 13.6/39.8  Rubella: Immune  Varicella: Immune  HIV: Negative  Hep B: Negative  RPR: Non-reactive  Hep C: Negative  UDS: Negative  GC/CT: Negative    Genetic screening:  First trimester: Declines  AFP: Declines  Horizon carrier screening: Declines  Fetal assessment scan:   1/11/20021 mobile, anterior, AFI  WNL, EFW: 247 g, 87th percentile, unremarkable anatomy    28 week labs:  Date:   H/H:    1 hr gtt:   3 hr GTT if indicated       Immunizations:  Tdap: to be given around 28 weeks   Flu: +  Rhogam administration: If indicated    Other:  GBS: to be collected around 36 weeks  Date of discussion of breastfeeding and plan:   Date of postpartum contraception discussion and plan:  Pain control in labor:  Circumcision if boy: N/A     . History of Graves' disease  Hypothyroidism 05/29/2019     S/p ablation in 2018  Follows with Dr. Chrystine Oiler at [facility]  10/15 TSI: 3.9  12/7 TSH: 1.254  06/26/19: 1.14  Current Regimen: Synthroid 112 mcg x 6 days and 168 mg x 1 day  S/p MFM consult   Plan for growth [place] at 28, 39, and 36 weeks.   Start weekly antenatal testing at 11 weeks.    Plan for delivery at 39-[redacted] weeks gestation     . Obesity  PCOS 05/29/2019     Body mass index is 32.8 kg/m.  Early 1 hour gtt: 144  06/12/2019: 3 hour--92, 153, 130, 99  Hemoglobin A1c: 4.9  Repeat 3 hour gtt at 28 weeks       1. Headache: Encouraged patient to take Tylenol. Advised her to let us know if symptoms do not improve. Explained that headache is a common symptom for COVID. Discussed testing for COVID.   2. BP Borderline today: Encouraged patient to monitor BPs at home. Patient to send in log via MyChart.  3. Discussed recommendation for Tdap vaccine at next visit.  4. TSH ordered today.  5. Preterm labor precautions and kick counts reviewed.  6. Signs/symptoms of preeclampsia reviewed.    RTC 4 weeks    Dema Severin, MD 07/27/2019 09:00  Department of Obstetrics & Gynecology

## 2019-07-29 ENCOUNTER — Other Ambulatory Visit (HOSPITAL_BASED_OUTPATIENT_CLINIC_OR_DEPARTMENT_OTHER): Payer: Self-pay | Admitting: INTERNAL MEDICINE-ENDOCRINOLOGY-DIABETES AND METABOLISM

## 2019-07-31 ENCOUNTER — Other Ambulatory Visit (HOSPITAL_BASED_OUTPATIENT_CLINIC_OR_DEPARTMENT_OTHER): Payer: Self-pay | Admitting: INTERNAL MEDICINE-ENDOCRINOLOGY-DIABETES AND METABOLISM

## 2019-07-31 MED ORDER — LEVOTHYROXINE 112 MCG TABLET
ORAL_TABLET | ORAL | 3 refills | Status: DC
Start: 2019-07-31 — End: 2020-08-12

## 2019-07-31 NOTE — Telephone Encounter (Signed)
Current Outpatient Medications   Medication Sig    levothyroxine (SYNTHROID) 112 mcg Oral Tablet Take one pill daily Monday through Saturday and 1/2 pill on Sunday. (Patient taking differently: Take one pill daily Monday through Saturday and 1 1/2 pill on Sunday.)    PNV Combo #19-Iron-Fol Ac-DHA 22-6-1-200 mg Oral Capsule Take 1 Cap by mouth Once a day     Last dept visit:  07/05/19  Next pending dept visit: 01/30/2020  Refill request for Levo , pended to provider   Keturah Shavers 07/31/2019, 10:27

## 2019-07-31 NOTE — Telephone Encounter (Signed)
Prescription already sent for refill. Duplicate request. Refuse and route.[name]  07/31/2019, 10:49

## 2019-08-07 ENCOUNTER — Encounter (HOSPITAL_COMMUNITY): Payer: Self-pay | Admitting: Student in an Organized Health Care Education/Training Program

## 2019-08-21 ENCOUNTER — Other Ambulatory Visit: Payer: Self-pay

## 2019-08-21 ENCOUNTER — Ambulatory Visit
Payer: 59 | Attending: Student in an Organized Health Care Education/Training Program | Admitting: Student in an Organized Health Care Education/Training Program

## 2019-08-21 ENCOUNTER — Other Ambulatory Visit (INDEPENDENT_AMBULATORY_CARE_PROVIDER_SITE_OTHER): Payer: Self-pay

## 2019-08-21 VITALS — BP 134/80 | Temp 98.3°F | Wt 225.2 lb

## 2019-08-21 DIAGNOSIS — O99282 Endocrine, nutritional and metabolic diseases complicating pregnancy, second trimester: Secondary | ICD-10-CM

## 2019-08-21 DIAGNOSIS — Z3490 Encounter for supervision of normal pregnancy, unspecified, unspecified trimester: Secondary | ICD-10-CM

## 2019-08-21 DIAGNOSIS — E0500 Thyrotoxicosis with diffuse goiter without thyrotoxic crisis or storm: Secondary | ICD-10-CM

## 2019-08-21 DIAGNOSIS — Z3A26 26 weeks gestation of pregnancy: Secondary | ICD-10-CM

## 2019-08-21 DIAGNOSIS — O99212 Obesity complicating pregnancy, second trimester: Secondary | ICD-10-CM

## 2019-08-21 NOTE — Progress Notes (Signed)
[facility]    RETURN OBSTETRICAL ENCOUNTER      DATE OF SERVICE: 08/21/2019, 10:28   PATIENT: [name]  CHART NUMBER: [id number]    Subjective:  29 y.o. G2P1001 at [redacted]w[redacted]d presenting for [place] visit.   Patient is doing well. She has no complaints. She reports good fetal movement. She denies contractions, vaginal bleeding, leaking of fluid, chest pain, shortness of breath, headache, vision changes, nausea, vomiting, abdominal pain, and edema.     Dating Summary      Working EDD: 11/21/19 set by Jearld Shines, Ambulatory Care Assistant on 05/29/19 based on Ultrasound on 03/28/19          Based On EDD GA Diff GA User Date    Last Menstrual Period on 01/14/19 (Exact Date)   10/21/19 +[redacted]w[redacted]d  System action - copied 05/29/19    Ultrasound on 03/28/19   11/21/19 Working [redacted]w[redacted]d Jearld Shines, Ambulatory Care Assistant 05/29/19             Objective:    Weight: 102 kg (225 lb 3.2 oz)  BP (Non-Invasive): 134/80  Protein: Negative  Glucose: Negative  Fundal height: 27  Preterm Labor: None  Fetal Movement: Present  Edema: Negative  FHR (1): 130s    A/P:  29 y.o. G2P1001 at 106w6d for [place]      Patient Active Problem List    Diagnosis Date Noted   . Prenatal care 05/29/2019     TRANSFER FROM DR. 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    Screening:  Pap: 03/28/2019 NILM    Initial prenatal labs:  Date: 03/30/2019  RH status: A NEGATIVE  H/H: 13.6/39.8  Rubella: Immune  Varicella: Immune  HIV: Negative  Hep B: Negative  RPR: Non-reactive  Hep C: Negative  UDS: Negative  GC/CT: Negative    Genetic screening:  First trimester: Declines  AFP: Declines  Horizon carrier screening: Declines  Fetal assessment scan:   1/11/20021 mobile, anterior, AFI WNL, EFW: 247 g, 87th percentile, unremarkable anatomy    28 week labs:  Date:   H/H:    1 hr gtt:   3 hr GTT if indicated       Immunizations:  Tdap: to be given around 28 weeks   Flu:  +  Rhogam administration: If indicated    Other:  GBS: to be collected around 36 weeks  Date of discussion of breastfeeding and plan:   Date of postpartum contraception discussion and plan:  Pain control in labor:  Circumcision if boy: N/A     . History of Graves' disease  Hypothyroidism 05/29/2019     S/p ablation in 2018  Follows with Dr. Chrystine Oiler at [facility]  10/15 TSI: 3.9  12/7 TSH: 1.254  06/26/19: 1.14  Current Regimen: Synthroid 112 mcg x 6 days and 168 mg x 1 day  S/p MFM consult   Plan for growth [place] at 28, 22, and 36 weeks.   Start weekly antenatal testing at 54 weeks.    Plan for delivery at 39-[redacted] weeks gestation     . Obesity  PCOS 05/29/2019     Body mass index is 32.8 kg/m.  Early 1 hour gtt: 144  06/12/2019: 3 hour--92, 153, 130, 99   Hemoglobin A1c: 4.9  Repeat 3 hour gtt at 28 weeks       1. Repeat TSH ordered  2. 28 week labs ordered and to be completed  prior to next appointment  3. Plan for RhoGAM and Tdap vaccine at next appointment.  4. Growth [place] ordered.  5. Preterm labor precautions and kick counts reviewed.  6. Signs/symptoms of preeclampsia reviewed.    RTC 2 weeks    Dema Severin, MD 08/21/2019 10:28  Department of Obstetrics & Gynecology

## 2019-08-22 ENCOUNTER — Encounter (HOSPITAL_COMMUNITY): Payer: Self-pay | Admitting: Student in an Organized Health Care Education/Training Program

## 2019-08-24 ENCOUNTER — Other Ambulatory Visit: Payer: Self-pay

## 2019-08-28 ENCOUNTER — Ambulatory Visit
Admission: RE | Admit: 2019-08-28 | Discharge: 2019-08-28 | Disposition: A | Discharge status: Home / Self Care | Payer: 59 | Source: Ambulatory Visit | Attending: Student in an Organized Health Care Education/Training Program | Admitting: Student in an Organized Health Care Education/Training Program

## 2019-08-28 ENCOUNTER — Other Ambulatory Visit: Payer: Self-pay

## 2019-08-28 DIAGNOSIS — E0500 Thyrotoxicosis with diffuse goiter without thyrotoxic crisis or storm: Secondary | ICD-10-CM | POA: Insufficient documentation

## 2019-09-03 ENCOUNTER — Encounter (HOSPITAL_BASED_OUTPATIENT_CLINIC_OR_DEPARTMENT_OTHER): Payer: Self-pay | Admitting: INTERNAL MEDICINE-ENDOCRINOLOGY-DIABETES AND METABOLISM

## 2019-09-03 ENCOUNTER — Other Ambulatory Visit: Payer: Self-pay

## 2019-09-03 ENCOUNTER — Ambulatory Visit: Payer: 59 | Attending: Student in an Organized Health Care Education/Training Program

## 2019-09-03 DIAGNOSIS — Z3490 Encounter for supervision of normal pregnancy, unspecified, unspecified trimester: Secondary | ICD-10-CM | POA: Insufficient documentation

## 2019-09-03 DIAGNOSIS — E039 Hypothyroidism, unspecified: Secondary | ICD-10-CM

## 2019-09-03 DIAGNOSIS — E669 Obesity, unspecified: Secondary | ICD-10-CM | POA: Insufficient documentation

## 2019-09-03 DIAGNOSIS — Z8639 Personal history of other endocrine, nutritional and metabolic disease: Secondary | ICD-10-CM

## 2019-09-03 LAB — CBC
HCT: 35.4 % — ABNORMAL LOW (ref 37.0–47.0)
HGB: 12.2 g/dL — ABNORMAL LOW (ref 12.5–16.0)
MCH: 31 pg (ref 28.0–33.0)
MCHC: 34.3 g/dL (ref 32.0–37.0)
MCV: 90.4 fL (ref 78.0–100.0)
MPV: 8.5 fL
PLATELETS: 201 10*3/uL (ref 130–400)
RBC: 3.92 10*6/uL — ABNORMAL LOW (ref 4.20–5.40)
RDW: 12.9 % (ref 9.9–16.5)
WBC: 10.8 10*3/uL (ref 4.5–11.0)

## 2019-09-03 LAB — GLUCOSE 1 HOUR POST DOSE: GLUCOSE 1 HR POST DOSE: 176 mg/dL

## 2019-09-03 LAB — THYROID STIMULATING HORMONE WITH FREE T4 REFLEX: TSH: 0.97 u[IU]/mL (ref 0.340–5.330)

## 2019-09-03 LAB — GLUCOSE FASTING FOR GTT: GLUCOSE FASTING FOR GTT: 102 mg/dL — ABNORMAL HIGH (ref 70–99)

## 2019-09-03 LAB — GLUCOSE 2 HOUR POST DOSE: GLUCOSE 2 HRS POST  DOSE: 159 mg/dL — ABNORMAL HIGH (ref 70–139)

## 2019-09-03 LAB — GLUCOSE 3 HOUR POST DOSE: GLUCOSE 3 HRS POST  DOSE: 96 mg/dL

## 2019-09-04 LAB — TYPE AND SCREEN
ABO/RH(D): A NEG
ANTIBODY SCREEN: NEGATIVE

## 2019-09-05 ENCOUNTER — Encounter (HOSPITAL_COMMUNITY): Payer: Self-pay | Admitting: Family

## 2019-09-05 ENCOUNTER — Other Ambulatory Visit: Payer: Self-pay

## 2019-09-05 ENCOUNTER — Ambulatory Visit: Payer: 59 | Admitting: Family

## 2019-09-05 ENCOUNTER — Ambulatory Visit
Admit: 2019-09-05 | Discharge: 2019-09-05 | Disposition: A | Discharge status: Home / Self Care | Payer: 59 | Source: Ambulatory Visit | Attending: Student in an Organized Health Care Education/Training Program | Admitting: Student in an Organized Health Care Education/Training Program

## 2019-09-05 ENCOUNTER — Ambulatory Visit (HOSPITAL_COMMUNITY): Payer: 59 | Admitting: Student in an Organized Health Care Education/Training Program

## 2019-09-05 DIAGNOSIS — O99283 Endocrine, nutritional and metabolic diseases complicating pregnancy, third trimester: Secondary | ICD-10-CM

## 2019-09-05 DIAGNOSIS — E669 Obesity, unspecified: Secondary | ICD-10-CM

## 2019-09-05 DIAGNOSIS — Z8639 Personal history of other endocrine, nutritional and metabolic disease: Secondary | ICD-10-CM

## 2019-09-05 DIAGNOSIS — Z3490 Encounter for supervision of normal pregnancy, unspecified, unspecified trimester: Secondary | ICD-10-CM | POA: Insufficient documentation

## 2019-09-05 DIAGNOSIS — Z3A29 29 weeks gestation of pregnancy: Secondary | ICD-10-CM

## 2019-09-05 DIAGNOSIS — E039 Hypothyroidism, unspecified: Secondary | ICD-10-CM

## 2019-09-05 LAB — RHIGG FOR INJECTION: BABY'S PATIENT NUMBER: 28

## 2019-09-05 MED ORDER — RHO(D) IMMUNE GLOBULIN 1,500 UNIT (300 MCG) INTRAMUSCULAR SYRINGE
300.0000 ug | INJECTION | Freq: Once | INTRAMUSCULAR | Status: AC
Start: 2019-09-05 — End: 2019-09-05
  Administered 2019-09-05: 16:00:00 300 ug via INTRAMUSCULAR
  Filled 2019-09-05: qty 300

## 2019-09-05 NOTE — Nurses Notes (Signed)
Arrived to unit for Rhogam injection

## 2019-09-05 NOTE — Progress Notes (Signed)
[facility]    RETURN OBSTETRICAL ENCOUNTER      DATE OF SERVICE: 09/05/2019, 08:47   PATIENT: [name]  CHART NUMBER: [id number]    Subjective:  29 y.o. G2P1001 at [redacted]w[redacted]d presenting for [place] visit.   Patient is doing well. She has no complaints. She reports positive fetal kick counts. She denies contractions, vaginal bleeding, leaking of fluid, chest pain, shortness of breath, headache, vision changes, nausea, vomiting, abdominal pain, and edema.     Dating Summary        Working EDD: 11/21/19 set by Jearld Shines, Ambulatory Care Assistant on 05/29/19 based on Ultrasound on 03/28/19              Based On EDD GA Diff GA User Date    Last Menstrual Period on 01/14/19 (Exact Date)   10/21/19 +[redacted]w[redacted]d  System action - copied 05/29/19    Ultrasound on 03/28/19   11/21/19 Working [redacted]w[redacted]d Jearld Shines, Ambulatory Care Assistant 05/29/19               Objective:      review of studies:  Ultrasound 08/28/2019:  AUA 28+ 6 weeks, EFW 1269 g, 65.7 percentile, breech presentation, anterior placenta, AFI 16.1 cm, anatomy not assessed.    Review of laboratories 09/03/2019:  3 hour Glucola 102, 176, 159, 96. Blood type A negative, antibody screen negative.  WBC 10.8, HGB 12.2, HCT 35.4, MCV 90.4, platelets 201. TSH 0.970.    A/P:  29 y.o. G2P1001 at [redacted]w[redacted]d for [place]      Patient Active Problem List    Diagnosis Date Noted    Prenatal care 05/29/2019     TRANSFER FROM DR. 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    Screening:  Pap: 03/28/2019 NILM    Initial prenatal labs:  Date: 03/30/2019  RH status: A NEGATIVE  H/H: 13.6/39.8  Rubella: Immune  Varicella: Immune  HIV: Negative  Hep B: Negative  RPR: Non-reactive  Hep C: Negative  UDS: Negative  GC/CT: Negative    Genetic screening:  First trimester: Declines  AFP: Declines  Horizon carrier screening: Declines  Fetal assessment scan:   1/11/20021 mobile, anterior, AFI WNL, EFW:  247 g, 87th percentile, unremarkable anatomy    28 week labs:  Date:  09/03/2019  H/H:  12.2/35.4  3 hr gtt:  102, 176, 159, 96          Immunizations:  Tdap: to be given around 28 weeks   Flu: +  Rhogam administration: If indicated    Other:  GBS: to be collected around 36 weeks  Date of discussion of breastfeeding and plan:   Date of postpartum contraception discussion and plan:  Pain control in labor:  Circumcision if boy: N/A      History of Graves' disease  Hypothyroidism 05/29/2019     S/p ablation in 2018  Follows with Dr. Chrystine Oiler at [facility]  10/15 TSI: 3.9  12/7 TSH: 1.254  06/26/19: 1.14  09/03/19 TSH:  0.970  Current Regimen: Synthroid 112 mcg x 6 days and 168 mg x 1 day  S/p MFM consult  Plan for growth [place] at 28, 32, and 36 weeks.  Ultrasound 08/28/2019 growth in the 65.7 percentile  Start weekly antenatal testing at 32 weeks.   Plan for delivery at 39-[redacted] weeks gestation      Obesity  PCOS 05/29/2019     Body mass index is  32.8 kg/m.  Early 1 hour gtt: 144  06/12/2019: 3 hour--92, 153, 130, 99   Hemoglobin A1c: 4.9  3282021:3 hour -- 102, 176, 159, 96.  Plan for serial ultrasounds for growth starting at 28 weeks.  [place] 08/28/19: 65.7th percentile.         28 week labs and TSH reviewed with patient and on chart as above.  RhoGAM not ordered with 28 week labs.  RhoGAM order placed today.  Patient to complete.  Ultrasound for growth reviewed with patient and on chart as above.  Plan for next ultrasound around [redacted] weeks EGA.  ACOG recommendations regarding Tdap vaccination reviewed.  Risks and benefits of vaccination versus no treatment discussed.  Patient desires vaccination.  Tdap vaccine administered today.  SROM/bleeding/PTL/FKC reviewed patient.  Signs/symptoms of preeclampsia reviewed.    RTC 2 weeks or sooner PRN      S. Lace Kasserman-Marshall APRN, FNP-BC     09/05/2019 09:18  Department of Obstetrics & Gynecology

## 2019-09-05 NOTE — Nurses Notes (Signed)
Leaves unit ambulatory

## 2019-09-06 LAB — RHIG FOR TRANSFUSION: UNIT DIVISION: 0

## 2019-09-08 ENCOUNTER — Encounter (HOSPITAL_COMMUNITY): Payer: Self-pay | Admitting: Student in an Organized Health Care Education/Training Program

## 2019-09-18 ENCOUNTER — Ambulatory Visit: Payer: 59 | Admitting: Student in an Organized Health Care Education/Training Program

## 2019-09-18 ENCOUNTER — Other Ambulatory Visit: Payer: Self-pay

## 2019-09-18 ENCOUNTER — Encounter (HOSPITAL_COMMUNITY): Payer: Self-pay | Admitting: Student in an Organized Health Care Education/Training Program

## 2019-09-18 VITALS — BP 123/80 | HR 108 | Temp 99.1°F | Resp 14 | Wt 231.6 lb

## 2019-09-18 DIAGNOSIS — E282 Polycystic ovarian syndrome: Secondary | ICD-10-CM

## 2019-09-18 DIAGNOSIS — Z3A30 30 weeks gestation of pregnancy: Secondary | ICD-10-CM

## 2019-09-18 DIAGNOSIS — O99213 Obesity complicating pregnancy, third trimester: Secondary | ICD-10-CM

## 2019-09-18 DIAGNOSIS — O99283 Endocrine, nutritional and metabolic diseases complicating pregnancy, third trimester: Secondary | ICD-10-CM

## 2019-09-18 DIAGNOSIS — Z8639 Personal history of other endocrine, nutritional and metabolic disease: Secondary | ICD-10-CM

## 2019-09-18 DIAGNOSIS — Z3490 Encounter for supervision of normal pregnancy, unspecified, unspecified trimester: Secondary | ICD-10-CM

## 2019-09-18 NOTE — Progress Notes (Signed)
[facility]    RETURN OBSTETRICAL ENCOUNTER      DATE OF SERVICE: 09/18/2019, 14:44   PATIENT: [name]  CHART NUMBER: [id number]    Subjective:  29 y.o. G2P1001 at [redacted]w[redacted]d presenting for ROB visit.   Patient is doing well. She has no complaints. She reports good fetal movement. She denies contractions, vaginal bleeding, leaking of fluid, chest pain, shortness of breath, headache, vision changes, nausea, vomiting, abdominal pain, and edema.      Dating Summary      Working EDD: 11/21/19 set by Jeneen Rinks, Ambulatory Care Assistant on 05/29/19 based on Ultrasound on 03/28/19          Based On EDD GA Diff GA User Date    Last Menstrual Period on 01/14/19 (Exact Date)   10/21/19 +[redacted]w[redacted]d  System action - copied 05/29/19    Ultrasound on 03/28/19   11/21/19 Working [redacted]w[redacted]d Jeneen Rinks, Ambulatory Care Assistant 05/29/19             Objective:    Weight: 105 kg (231 lb 9.6 oz)  BP (Non-Invasive): 123/80  Protein: Negative  Glucose: Negative  Fundal height: 31  Preterm Labor: None  Fetal Movement: Present  Edema: Negative  FHR (1): 140s    A/P:  29 y.o. G2P1001 at [redacted]w[redacted]d for ROB      Patient Active Problem List    Diagnosis Date Noted    Prenatal care 05/29/2019     TRANSFER FROM DR. Pamala Duffel AT [redacted]W[redacted]D    FOB: [name] (Husband)  Gender: Female  Occupation: Pension scheme manager at [employer]    Screening:  Pap: 03/28/2019 NILM    Initial prenatal labs:  Date: 03/30/2019  RH status: A NEGATIVE  H/H: 13.6/39.8  Rubella: Immune  Varicella: Immune  HIV: Negative  Hep B: Negative  RPR: Non-reactive  Hep C: Negative  UDS: Negative  GC/CT: Negative    Genetic screening:  First trimester: Declines  AFP: Declines  Horizon carrier screening: Declines  Fetal assessment scan:   1/11/20021 mobile, anterior, AFI WNL, EFW: 247 g, 87th percentile, unremarkable anatomy    28 week labs:  Date:  09/03/2019  H/H:  12.2/35.4  3 hr gtt:  102, 176, 159, 96          Immunizations:  Tdap: Given 3/30  Flu:  +   Rhogam administration: If indicated    Other:  GBS: to be collected around 36 weeks  Date of discussion of breastfeeding and plan: Breast  Date of postpartum contraception discussion and plan: Undecided  Pain control in labor: No epidural  Circumcision if boy: N/A      History of Graves' disease   Hypothyroidism 05/29/2019     S/p ablation in 2018  Follows with Dr. Ronny Flurry at [facility]  10/15 TSI: 3.9  12/7 TSH: 1.254  06/26/19: 1.14  09/03/19 TSH:  0.970  Current Regimen: Synthroid 112 mcg x 6 days and 168 mg x 1 day  S/p MFM consult   Plan for growth [place] at 28, 32, and 36 weeks.   Ultrasound 08/28/2019 growth in the 65.7 percentile   Start weekly antenatal testing at 32 weeks.    Plan for delivery at 39-[redacted] weeks gestation      Obesity   PCOS 05/29/2019     Body mass index is 32.8 kg/m.  Early 1 hour gtt: 144  06/12/2019: 3 hour--92, 153, 130, 99   Hemoglobin A1c: 4.9  3282021:3 hour -- 102, 176, 159, 96.  Plan  for serial ultrasounds for growth starting at 28 weeks.  [place] 08/28/19: 65.7th percentile.         1. Growth [place] ordered  2. Plan to initiate antenatal testing next week  3. Preterm labor precautions and kick counts reviewed.   4. Signs/symptoms of preeclampsia reviewed.    RTC 1 week for ROB/NST    [name], MD 09/18/2019 14:44  Department of Obstetrics & Gynecology

## 2019-09-19 ENCOUNTER — Other Ambulatory Visit: Payer: Self-pay

## 2019-09-26 ENCOUNTER — Ambulatory Visit (INDEPENDENT_AMBULATORY_CARE_PROVIDER_SITE_OTHER): Payer: 59 | Admitting: Student in an Organized Health Care Education/Training Program

## 2019-09-26 ENCOUNTER — Other Ambulatory Visit: Payer: Self-pay

## 2019-09-26 ENCOUNTER — Ambulatory Visit
Admission: RE | Admit: 2019-09-26 | Discharge: 2019-09-26 | Disposition: A | Discharge status: Home / Self Care | Payer: 59 | Source: Ambulatory Visit | Attending: Student in an Organized Health Care Education/Training Program | Admitting: Student in an Organized Health Care Education/Training Program

## 2019-09-26 ENCOUNTER — Encounter (HOSPITAL_COMMUNITY): Payer: Self-pay | Admitting: Student in an Organized Health Care Education/Training Program

## 2019-09-26 VITALS — BP 135/81 | HR 100 | Temp 99.1°F | Wt 235.2 lb

## 2019-09-26 DIAGNOSIS — E282 Polycystic ovarian syndrome: Secondary | ICD-10-CM

## 2019-09-26 DIAGNOSIS — O99283 Endocrine, nutritional and metabolic diseases complicating pregnancy, third trimester: Secondary | ICD-10-CM

## 2019-09-26 DIAGNOSIS — Z3A32 32 weeks gestation of pregnancy: Secondary | ICD-10-CM

## 2019-09-26 DIAGNOSIS — Z8639 Personal history of other endocrine, nutritional and metabolic disease: Secondary | ICD-10-CM | POA: Insufficient documentation

## 2019-09-26 DIAGNOSIS — O99213 Obesity complicating pregnancy, third trimester: Secondary | ICD-10-CM

## 2019-09-26 NOTE — Procedures (Signed)
[facility]    Fetal Non Stress Test  Procedure Note      DATE OF SERVICE: 09/26/2019, 10:30   PATIENT: [name]  CHART NUMBER: [id number]    Indication: History of Grave's Disease  Procedure: Fetal Non Stress Test      [redacted]w[redacted]d  Patient's last menstrual period was Patient's last menstrual period was 01/14/2019 (exact date).  Estimated Date of Delivery: Estimated Date of Delivery: 11/21/19    Fetal Heart:    Base Line: 135 bpm                         Decelerations:  None    Uterine Activity:  Contractions: No    Interpretation: Reactive    NST read by Dagoberto Ligas, MD      Dema Severin, MD 09/26/2019 10:30  Department of Obstetrics & Gynecology

## 2019-09-26 NOTE — Progress Notes (Signed)
[facility]    RETURN OBSTETRICAL ENCOUNTER      DATE OF SERVICE: 09/26/2019, 10:05   PATIENT: [name]  CHART NUMBER: [id number]    Subjective:  29 y.o. G2P1001 at [redacted]w[redacted]d presenting for ROB visit.   Patient is doing well. She has no complaints. She reports good fetal movement. She denies contractions, vaginal bleeding, leaking of fluid, chest pain, shortness of breath, headache, vision changes, nausea, vomiting, abdominal pain, and edema.     Dating Summary      Working EDD: 11/21/19 set by Jeneen Rinks, Ambulatory Care Assistant on 05/29/19 based on Ultrasound on 03/28/19          Based On EDD GA Diff GA User Date    Last Menstrual Period on 01/14/19 (Exact Date)   10/21/19 +[redacted]w[redacted]d  System action - copied 05/29/19    Ultrasound on 03/28/19   11/21/19 Working [redacted]w[redacted]d Jeneen Rinks, Ambulatory Care Assistant 05/29/19             Objective:    Weight: 107 kg (235 lb 3.2 oz)  BP (Non-Invasive): 135/81  Protein: Negative  Glucose: Negative  Fundal height: 32  Preterm Labor: None  Fetal Movement: Present  Edema: Negative  FHR (1): Reactive NST    A/P:  29 y.o. G2P1001 at [redacted]w[redacted]d for ROB      Patient Active Problem List    Diagnosis Date Noted    Prenatal care 05/29/2019     TRANSFER FROM DR. Pamala Duffel AT [redacted]W[redacted]D    FOB: [name] (Husband)  Gender: Female  Occupation: Pension scheme manager at [employer]    Screening:  Pap: 03/28/2019 NILM    Initial prenatal labs:  Date: 03/30/2019  RH status: A NEGATIVE  H/H: 13.6/39.8  Rubella: Immune  Varicella: Immune  HIV: Negative  Hep B: Negative  RPR: Non-reactive  Hep C: Negative  UDS: Negative  GC/CT: Negative    Genetic screening:  First trimester: Declines  AFP: Declines  Horizon carrier screening: Declines  Fetal assessment scan:   1/11/20021 mobile, anterior, AFI WNL, EFW: 247 g, 87th percentile, unremarkable anatomy    28 week labs:  Date:  09/03/2019  H/H:  12.2/35.4  3 hr gtt:  102, 176, 159, 96          Immunizations:  Tdap: Given  3/30  Flu: +   Rhogam administration: If indicated    Other:  GBS: to be collected around 36 weeks  Date of discussion of breastfeeding and plan: Breast  Date of postpartum contraception discussion and plan: Undecided  Pain control in labor: No epidural  Circumcision if boy: N/A      History of Graves' disease   Hypothyroidism 05/29/2019     S/p ablation in 2018  Follows with Dr. Ronny Flurry at [facility]  10/15 TSI: 3.9  12/7 TSH: 1.254  06/26/19: 1.14  09/03/19 TSH:  0.970  Current Regimen: Synthroid 112 mcg x 6 days and 168 mg x 1 day  S/p MFM consult   Plan for growth [place] at 28, 32, and 36 weeks.   Ultrasound 08/28/2019 growth in the 65.7 percentile   Continue weekly testing with NST and fluid check   Plan for delivery at 39-[redacted] weeks gestation      Obesity   PCOS 05/29/2019     Body mass index is 32.8 kg/m.  Early 1 hour gtt: 144  06/12/2019: 3 hour--92, 153, 130, 99   Hemoglobin A1c: 4.9  3282021:3 hour -- 102, 176, 159, 96.  Plan  for serial ultrasounds for growth starting at 28 weeks.  [place] 08/28/19: 65.7th percentile.         1. Reactive NST. See procedure note.  2. Growth [place] scheduled after appointment today.  3. Plan for weekly NST with AFI check.  4. Preterm labor precautions and kick counts reviewed.  5. Signs/symptoms of preeclampsia reviewed.    RTC 1 week    [name], MD 09/26/2019 10:05  Department of Obstetrics & Gynecology

## 2019-10-04 ENCOUNTER — Other Ambulatory Visit: Payer: Self-pay

## 2019-10-04 ENCOUNTER — Ambulatory Visit (HOSPITAL_COMMUNITY): Payer: 59 | Admitting: OBSTETRICS/GYNECOLOGY

## 2019-10-04 ENCOUNTER — Ambulatory Visit (HOSPITAL_COMMUNITY): Payer: 59

## 2019-10-04 ENCOUNTER — Ambulatory Visit
Admission: RE | Admit: 2019-10-04 | Discharge: 2019-10-04 | Disposition: A | Discharge status: Home / Self Care | Payer: 59 | Source: Ambulatory Visit | Attending: Student in an Organized Health Care Education/Training Program | Admitting: Student in an Organized Health Care Education/Training Program

## 2019-10-04 ENCOUNTER — Encounter (HOSPITAL_COMMUNITY): Payer: Self-pay | Admitting: Student in an Organized Health Care Education/Training Program

## 2019-10-04 VITALS — BP 131/85 | Temp 98.7°F | Wt 234.8 lb

## 2019-10-04 DIAGNOSIS — O09899 Supervision of other high risk pregnancies, unspecified trimester: Secondary | ICD-10-CM

## 2019-10-04 DIAGNOSIS — E039 Hypothyroidism, unspecified: Secondary | ICD-10-CM

## 2019-10-04 DIAGNOSIS — Z8639 Personal history of other endocrine, nutritional and metabolic disease: Secondary | ICD-10-CM | POA: Insufficient documentation

## 2019-10-04 LAB — THYROID STIMULATING HORMONE (SENSITIVE TSH): TSH: 0.878 u[IU]/mL (ref 0.430–3.550)

## 2019-10-05 ENCOUNTER — Encounter (HOSPITAL_COMMUNITY): Payer: Self-pay

## 2019-10-10 ENCOUNTER — Ambulatory Visit: Payer: 59 | Admitting: Student in an Organized Health Care Education/Training Program

## 2019-10-10 ENCOUNTER — Other Ambulatory Visit: Payer: Self-pay

## 2019-10-10 ENCOUNTER — Encounter (HOSPITAL_COMMUNITY): Payer: Self-pay | Admitting: Student in an Organized Health Care Education/Training Program

## 2019-10-10 ENCOUNTER — Other Ambulatory Visit (HOSPITAL_BASED_OUTPATIENT_CLINIC_OR_DEPARTMENT_OTHER): Payer: Self-pay | Admitting: Student in an Organized Health Care Education/Training Program

## 2019-10-10 VITALS — BP 150/89 | HR 116 | Temp 98.5°F | Wt 235.8 lb

## 2019-10-10 DIAGNOSIS — Z8639 Personal history of other endocrine, nutritional and metabolic disease: Secondary | ICD-10-CM

## 2019-10-10 DIAGNOSIS — O99283 Endocrine, nutritional and metabolic diseases complicating pregnancy, third trimester: Secondary | ICD-10-CM

## 2019-10-10 DIAGNOSIS — O99213 Obesity complicating pregnancy, third trimester: Secondary | ICD-10-CM

## 2019-10-10 DIAGNOSIS — Z3A34 34 weeks gestation of pregnancy: Secondary | ICD-10-CM

## 2019-10-10 DIAGNOSIS — E669 Obesity, unspecified: Secondary | ICD-10-CM

## 2019-10-10 DIAGNOSIS — R030 Elevated blood-pressure reading, without diagnosis of hypertension: Secondary | ICD-10-CM

## 2019-10-10 DIAGNOSIS — E039 Hypothyroidism, unspecified: Secondary | ICD-10-CM

## 2019-10-10 NOTE — Patient Instructions (Signed)
You are to check your blood pressure multiple times throughout the day and keep a log of the readings. In order to do this properly, it is important that you...  - Have been at rest for 10 minutes prior to measurement  - Are sitting with you feet flat on the ground  - Your arm is at the level of your heart  - Do not move while your blood pressure is being measured    When to call the physician...  - Persistently > 150/100  - BP >160/110  - Dizziness or lightheadedness when transitioning for laying to sitting/standing  - Headache not relieved with tylenol or rest  - Visual disturbances  - Sudden onset severe abdominal pain  - Increased swelling

## 2019-10-10 NOTE — Progress Notes (Signed)
[facility]    RETURN OBSTETRICAL ENCOUNTER      DATE OF SERVICE: 10/10/2019, 11:12   PATIENT: [name]  CHART NUMBER: [id number]    Subjective:  29 y.o. G2P1001 at [redacted]w[redacted]d presenting for ROB visit.   Patient is doing well. She has no complaints. She reports good fetal movement. She denies contractions, vaginal bleeding, leaking of fluid, chest pain, shortness of breath, headache, vision changes, nausea, vomiting, abdominal pain, and edema.     Dating Summary      Working EDD: 11/21/19 set by Jeneen Rinks, Ambulatory Care Assistant on 05/29/19 based on Ultrasound on 03/28/19          Based On EDD GA Diff GA User Date    Last Menstrual Period on 01/14/19 (Exact Date)   10/21/19 +[redacted]w[redacted]d  System action - copied 05/29/19    Ultrasound on 03/28/19   11/21/19 Working [redacted]w[redacted]d Jeneen Rinks, Ambulatory Care Assistant 05/29/19             Objective:    Weight: 107 kg (235 lb 12.8 oz)  BP (Non-Invasive): (!) 150/89  Protein: Negative  Glucose: Negative  Preterm Labor: None  Fetal Movement: Present  Edema: Negative  FHR (1): Reactive NST    Repeat BP: 136/85, 124/80    A/P:  29 y.o. G2P1001 at [redacted]w[redacted]d for ROB      Patient Active Problem List    Diagnosis Date Noted    Elevated BP without diagnosis of hypertension 10/14/2019     Elevated BP on 10/10/2019  HELLP labs and Pr/Cr ratio ordered  Discussed home BP monitoring.      Prenatal care 05/29/2019     TRANSFER FROM DR. Pamala Duffel AT [redacted]W[redacted]D    FOB: [name] (Husband)  Gender: Female  Occupation: Pension scheme manager at [employer]    Screening:  Pap: 03/28/2019 NILM    Initial prenatal labs:  Date: 03/30/2019  RH status: A NEGATIVE  H/H: 13.6/39.8  Rubella: Immune  Varicella: Immune  HIV: Negative  Hep B: Negative  RPR: Non-reactive  Hep C: Negative  UDS: Negative  GC/CT: Negative    Genetic screening:  First trimester: Declines  AFP: Declines  Horizon carrier screening: Declines  Fetal assessment scan:   1/11/20021 mobile, anterior, AFI WNL,  EFW: 247 g, 87th percentile, unremarkable anatomy    28 week labs:  Date:  09/03/2019  H/H:  12.2/35.4  3 hr gtt:  102, 176, 159, 96          Immunizations:  Tdap: Given 3/30  Flu: +   Rhogam administration: If indicated    Other:  GBS: to be collected around 36 weeks  Date of discussion of breastfeeding and plan: Breast  Date of postpartum contraception discussion and plan: Undecided  Pain control in labor: No epidural  Circumcision if boy: N/A      History of Graves' disease   Hypothyroidism 05/29/2019     S/p ablation in 2018  Follows with Dr. Ronny Flurry at [facility]  10/15 TSI: 3.9  12/7 TSH: 1.254  06/26/19: 1.14  09/03/19 TSH:  0.970  10/04/19: TSH: 0.878  Current Regimen: Synthroid 112 mcg x 6 days and 168 mg x 1 day  S/p MFM consult   Plan for growth [place] at 28, 32, and 36 weeks.   Ultrasound 09/26/19 growth in the 73rd percentile   Continue weekly testing with NST and fluid check   Plan for delivery at 39-[redacted] weeks gestation      Obesity  PCOS 05/29/2019     Body mass index is 32.8 kg/m.  Early 1 hour gtt: 144  06/12/2019: 3 hour--92, 153, 130, 99   Hemoglobin A1c: 4.9  09/03/2019:3 hour -- 102, 176, 159, 96.         1. Elevated BP today with repeat normal range. HELLP labs and Pr/Cr ratio ordered.  2. Discussed home BP monitoring.  3. Reactive NST today. See procedure note.  4. AFI ultrasound scheduled for 5/6.   AFI ultrasound ordered for next week  5. Preterm labor precautions and kick counts reviewed.  6. Signs/symptoms of preeclampsia reviewed.     RTC 1 week    [name], MD 10/10/2019 11:12  Department of Obstetrics & Gynecology

## 2019-10-11 ENCOUNTER — Encounter (HOSPITAL_COMMUNITY): Payer: Self-pay | Admitting: OBSTETRICS/GYNECOLOGY

## 2019-10-12 ENCOUNTER — Other Ambulatory Visit: Payer: Self-pay

## 2019-10-12 ENCOUNTER — Other Ambulatory Visit (HOSPITAL_COMMUNITY): Payer: Self-pay | Admitting: OBSTETRICS/GYNECOLOGY

## 2019-10-12 ENCOUNTER — Ambulatory Visit (HOSPITAL_COMMUNITY): Payer: 59

## 2019-10-12 ENCOUNTER — Ambulatory Visit
Admission: RE | Admit: 2019-10-12 | Discharge: 2019-10-12 | Disposition: A | Discharge status: Home / Self Care | Payer: 59 | Source: Ambulatory Visit | Attending: OBSTETRICS/GYNECOLOGY | Admitting: OBSTETRICS/GYNECOLOGY

## 2019-10-12 ENCOUNTER — Encounter (HOSPITAL_COMMUNITY): Payer: Self-pay | Admitting: Student in an Organized Health Care Education/Training Program

## 2019-10-12 DIAGNOSIS — O09899 Supervision of other high risk pregnancies, unspecified trimester: Secondary | ICD-10-CM | POA: Insufficient documentation

## 2019-10-12 DIAGNOSIS — R030 Elevated blood-pressure reading, without diagnosis of hypertension: Secondary | ICD-10-CM

## 2019-10-12 LAB — COMPREHENSIVE METABOLIC PANEL, NON-FASTING
ALBUMIN: 3 g/dL — ABNORMAL LOW (ref 3.5–5.0)
ALKALINE PHOSPHATASE: 89 U/L (ref 40–110)
ALT (SGPT): 10 U/L (ref 8–22)
ANION GAP: 8 mmol/L (ref 4–13)
AST (SGOT): 12 U/L (ref 8–45)
BILIRUBIN TOTAL: 0.4 mg/dL (ref 0.3–1.3)
BUN/CREA RATIO: 7 (ref 6–22)
BUN: 4 mg/dL — ABNORMAL LOW (ref 8–25)
CALCIUM: 9 mg/dL (ref 8.5–10.0)
CHLORIDE: 106 mmol/L (ref 96–111)
CO2 TOTAL: 23 mmol/L (ref 22–30)
CREATININE: 0.58 mg/dL — ABNORMAL LOW (ref 0.60–1.05)
ESTIMATED GFR: >90 mL/min/BSA (ref >=60)
GLUCOSE: 109 mg/dL (ref 65–125)
POTASSIUM: 3.9 mmol/L (ref 3.5–5.1)
PROTEIN TOTAL: 6.7 g/dL (ref 6.4–8.3)
SODIUM: 137 mmol/L (ref 136–145)

## 2019-10-12 LAB — CBC
HCT: 34.4 % — ABNORMAL LOW (ref 37.0–47.0)
HGB: 12.5 g/dL (ref 12.5–16.0)
MCH: 32.2 pg (ref 28.0–33.0)
MCHC: 36.3 g/dL (ref 32.0–37.0)
MCV: 88.6 fL (ref 78.0–100.0)
MPV: 7.9 fL
PLATELETS: 188 10*3/uL (ref 130–400)
RBC: 3.88 10*6/uL — ABNORMAL LOW (ref 4.20–5.40)
RDW: 13.6 % (ref 9.9–16.5)
WBC: 10.9 10*3/uL (ref 4.5–11.0)

## 2019-10-12 LAB — PROTEIN/CREATININE RATIO, URINE, RANDOM
CREATININE RANDOM URINE: 17 mg/dL — ABNORMAL LOW (ref 50–100)
PROTEIN RANDOM URINE: <7 mg/dL
PROTEIN/CREATININE RATIO RANDOM URINE: <0.412 mg/g — ABNORMAL LOW (ref 10.000–105.000)

## 2019-10-13 ENCOUNTER — Encounter (HOSPITAL_COMMUNITY): Payer: Self-pay | Admitting: Student in an Organized Health Care Education/Training Program

## 2019-10-13 ENCOUNTER — Encounter (HOSPITAL_COMMUNITY): Payer: Self-pay | Admitting: OBSTETRICS/GYNECOLOGY

## 2019-10-14 ENCOUNTER — Encounter (HOSPITAL_COMMUNITY): Payer: Self-pay | Admitting: Student in an Organized Health Care Education/Training Program

## 2019-10-14 DIAGNOSIS — R030 Elevated blood-pressure reading, without diagnosis of hypertension: Secondary | ICD-10-CM | POA: Insufficient documentation

## 2019-10-14 NOTE — Procedures (Signed)
[facility]    Fetal Non Stress Test  Procedure Note      DATE OF SERVICE: 10/14/2019, 18:24   PATIENT: [name]  CHART NUMBER: [id number]    Indication: Graves disease  Procedure: Fetal Non Stress Test      [redacted]w[redacted]d  Patient's last menstrual period was Patient's last menstrual period was 01/14/2019 (exact date).  Estimated Date of Delivery: Estimated Date of Delivery: 11/21/19    Fetal Heart:    Base Line: 135 bpm                         Decelerations:  None    Uterine Activity:  Contractions: Irritability    Interpretation: Reactive    NST read by Dagoberto Ligas, MD      Dema Severin, MD 10/14/2019 18:24  Department of Obstetrics & Gynecology

## 2019-10-17 ENCOUNTER — Ambulatory Visit
Admission: RE | Admit: 2019-10-17 | Discharge: 2019-10-17 | Disposition: A | Discharge status: Home / Self Care | Payer: 59 | Source: Ambulatory Visit | Attending: Student in an Organized Health Care Education/Training Program | Admitting: Student in an Organized Health Care Education/Training Program

## 2019-10-17 ENCOUNTER — Other Ambulatory Visit: Payer: Self-pay

## 2019-10-17 DIAGNOSIS — O99283 Endocrine, nutritional and metabolic diseases complicating pregnancy, third trimester: Secondary | ICD-10-CM

## 2019-10-17 DIAGNOSIS — Z8639 Personal history of other endocrine, nutritional and metabolic disease: Secondary | ICD-10-CM

## 2019-10-17 DIAGNOSIS — E039 Hypothyroidism, unspecified: Secondary | ICD-10-CM | POA: Insufficient documentation

## 2019-10-18 ENCOUNTER — Other Ambulatory Visit (INDEPENDENT_AMBULATORY_CARE_PROVIDER_SITE_OTHER): Payer: Self-pay

## 2019-10-18 ENCOUNTER — Ambulatory Visit
Payer: 59 | Attending: Student in an Organized Health Care Education/Training Program | Admitting: Student in an Organized Health Care Education/Training Program

## 2019-10-18 ENCOUNTER — Encounter (HOSPITAL_COMMUNITY): Payer: Self-pay | Admitting: Student in an Organized Health Care Education/Training Program

## 2019-10-18 VITALS — BP 131/82 | Temp 98.0°F | Wt 238.0 lb

## 2019-10-18 DIAGNOSIS — Z3A35 35 weeks gestation of pregnancy: Secondary | ICD-10-CM

## 2019-10-18 DIAGNOSIS — R030 Elevated blood-pressure reading, without diagnosis of hypertension: Secondary | ICD-10-CM

## 2019-10-18 DIAGNOSIS — E0500 Thyrotoxicosis with diffuse goiter without thyrotoxic crisis or storm: Secondary | ICD-10-CM

## 2019-10-18 DIAGNOSIS — Z3490 Encounter for supervision of normal pregnancy, unspecified, unspecified trimester: Secondary | ICD-10-CM

## 2019-10-18 DIAGNOSIS — Z8639 Personal history of other endocrine, nutritional and metabolic disease: Secondary | ICD-10-CM

## 2019-10-18 DIAGNOSIS — O99283 Endocrine, nutritional and metabolic diseases complicating pregnancy, third trimester: Secondary | ICD-10-CM

## 2019-10-18 DIAGNOSIS — O99213 Obesity complicating pregnancy, third trimester: Secondary | ICD-10-CM

## 2019-10-18 NOTE — Procedures (Signed)
[facility]    Fetal Non Stress Test  Procedure Note      DATE OF SERVICE: 10/18/2019, 14:25   PATIENT: [name]  CHART NUMBER: [id number]    Indication: Graves Disease  Procedure: Fetal Non Stress Test      [redacted]w[redacted]d  Patient's last menstrual period was Patient's last menstrual period was 01/14/2019 (exact date).  Estimated Date of Delivery: Estimated Date of Delivery: 11/21/19    Fetal Heart:    Base Line: 130s bpm                         Decelerations:  None    Uterine Activity:  Contractions: Yes-irregular. Patient denies    Interpretation: Reactive    NST read by Dagoberto Ligas, MD      Dema Severin, MD 10/18/2019 14:25  Department of Obstetrics & Gynecology

## 2019-10-18 NOTE — Progress Notes (Signed)
[facility]    RETURN OBSTETRICAL ENCOUNTER      DATE OF SERVICE: 10/18/2019, 14:00   PATIENT: [name]  CHART NUMBER: [id number]    Subjective:  29 y.o. G2P1001 at [redacted]w[redacted]d presenting for [place] visit.   Patient is doing well. She has no complaints She reports good fetal movement. She denies contractions, vaginal bleeding, leaking of fluid, chest pain, shortness of breath, headache, vision changes, nausea, vomiting, abdominal pain, and edema.     Dating Summary      Working EDD: 11/21/19 set by Jearld Shines, Ambulatory Care Assistant on 05/29/19 based on Ultrasound on 03/28/19          Based On EDD GA Diff GA User Date    Last Menstrual Period on 01/14/19 (Exact Date)   10/21/19 +[redacted]w[redacted]d  System action - copied 05/29/19    Ultrasound on 03/28/19   11/21/19 Working [redacted]w[redacted]d Jearld Shines, Ambulatory Care Assistant 05/29/19             Objective:    Weight: 108 kg (238 lb)  BP (Non-Invasive): 131/82  Protein: Negative  Glucose: Negative  Fundal height: 35  Preterm Labor: None  Fetal Movement: Present  Edema: Negative  FHR (1): Reactive NST    A/P:  29 y.o. G2P1001 at [redacted]w[redacted]d for ROB      Patient Active Problem List    Diagnosis Date Noted   . Elevated BP without diagnosis of hypertension 10/14/2019     Elevated BP on 10/10/2019  HELLP labs WNL and Pr/Cr ratio NC  Discussed home BP monitoring.     . Prenatal care 05/29/2019     TRANSFER FROM DR. 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    Screening:  Pap: 03/28/2019 NILM    Initial prenatal labs:  Date: 03/30/2019  RH status: A NEGATIVE  H/H: 13.6/39.8  Rubella: Immune  Varicella: Immune  HIV: Negative  Hep B: Negative  RPR: Non-reactive  Hep C: Negative  UDS: Negative  GC/CT: Negative    Genetic screening:  First trimester: Declines  AFP: Declines  Horizon carrier screening: Declines  Fetal assessment scan:   1/11/20021 mobile, anterior, AFI WNL, EFW: 247 g, 87th  percentile, unremarkable anatomy    28 week labs:  Date:  09/03/2019  H/H:  12.2/35.4  3 hr gtt:  102, 176, 159, 96          Immunizations:  Tdap: Given 3/30  Flu: +   Rhogam administration: If indicated    Other:  GBS: to be collected around 36 weeks  Date of discussion of breastfeeding and plan: Breast  Date of postpartum contraception discussion and plan: Undecided  Pain control in labor: No epidural  Circumcision if boy: N/A     . History of Graves' disease  Hypothyroidism 05/29/2019     S/p ablation in 2018  Follows with Dr. Chrystine Oiler at [facility]  10/15 TSI: 3.9  12/7 TSH: 1.254  06/26/19: 1.14  09/03/19 TSH:  0.970  10/04/19: TSH: 0.878  Current Regimen: Synthroid 112 mcg x 6 days and 168 mg x 1 day  S/p MFM consult   Plan for growth [place] at 28, 60, and 36 weeks.   Ultrasound 09/26/19 growth in the 73rd percentile   Continue weekly testing with NST and fluid check   Plan for delivery at 39-[redacted] weeks gestation     . Obesity  PCOS 05/29/2019     Body  mass index is 32.8 kg/m.  Early 1 hour gtt: 144  06/12/2019: 3 hour--92, 153, 130, 99   Hemoglobin A1c: 4.9  09/03/2019:3 hour -- 102, 176, 159, 96.         1. Patient forgot home BP log. Will send via MyChart.  2. Reactive NST. See procedure note.  3. Growth [place] ordered for next visit  4. Preterm labor precautions and kick counts reviewed.  5. Signs/symptoms of preeclampsia reviewed.    RTC 1 week    Dema Severin, MD 10/18/2019 14:00  Department of Obstetrics & Gynecology

## 2019-10-20 ENCOUNTER — Other Ambulatory Visit (HOSPITAL_COMMUNITY): Payer: Self-pay

## 2019-10-25 ENCOUNTER — Other Ambulatory Visit (HOSPITAL_COMMUNITY): Payer: Self-pay

## 2019-10-25 ENCOUNTER — Ambulatory Visit (HOSPITAL_COMMUNITY): Payer: 59 | Admitting: Family

## 2019-10-25 ENCOUNTER — Ambulatory Visit (INDEPENDENT_AMBULATORY_CARE_PROVIDER_SITE_OTHER): Payer: 59 | Admitting: Family

## 2019-10-25 ENCOUNTER — Ambulatory Visit
Admission: RE | Admit: 2019-10-25 | Discharge: 2019-10-25 | Disposition: A | Discharge status: Home / Self Care | Payer: 59 | Source: Ambulatory Visit | Attending: Student in an Organized Health Care Education/Training Program | Admitting: Student in an Organized Health Care Education/Training Program

## 2019-10-25 ENCOUNTER — Other Ambulatory Visit: Payer: Self-pay

## 2019-10-25 VITALS — BP 130/80 | Temp 98.3°F | Wt 237.8 lb

## 2019-10-25 DIAGNOSIS — Z3483 Encounter for supervision of other normal pregnancy, third trimester: Secondary | ICD-10-CM | POA: Insufficient documentation

## 2019-10-25 DIAGNOSIS — Z8639 Personal history of other endocrine, nutritional and metabolic disease: Secondary | ICD-10-CM

## 2019-10-25 NOTE — Progress Notes (Signed)
[facility]    RETURN OBSTETRICAL ENCOUNTER      DATE OF SERVICE: 10/25/2019, 15:13   PATIENT: [name]  CHART NUMBER: [id number]    Subjective:  29 y.o. G2P1001 at [redacted]w[redacted]d presenting for [place] visit.   Patient is doing well. She has no complaints She reports good fetal movement. She denies contractions, vaginal bleeding, leaking of fluid, chest pain, shortness of breath, headache, vision changes, nausea, vomiting, abdominal pain, and edema.     Dating Summary        Working EDD: 11/21/19 set by Jearld Shines, Ambulatory Care Assistant on 05/29/19 based on Ultrasound on 03/28/19              Based On EDD GA Diff GA User Date    Last Menstrual Period on 01/14/19 (Exact Date)   10/21/19 +[redacted]w[redacted]d  System action - copied 05/29/19    Ultrasound on 03/28/19   11/21/19 Working [redacted]w[redacted]d Jearld Shines, Ambulatory Care Assistant 05/29/19               Objective:    Weight: 108 kg (237 lb 12.8 oz)  BP (Non-Invasive): 130/80  Protein: Negative  Glucose: Negative  Fundal height: 36  Preterm Labor: None  Fetal Movement: Present  Presentation: Cephalic  Edema: Negative  Station: -3  FHR (1): Reactive NST    NST reviewed with Dr. Jaquelyn Bitter: Baseline FHR 140, positive accelerations, no decelerations, category 1 tracing with moderate variability. TOCO with irritability.      A/P:  29 y.o. G2P1001 at [redacted]w[redacted]d for [place], doing well.      Patient Active Problem List    Diagnosis Date Noted    Elevated BP without diagnosis of hypertension 10/14/2019     Elevated BP on 10/10/2019  HELLP labs WNL and Pr/Cr ratio NC  Discussed home BP monitoring.      Prenatal care 05/29/2019     TRANSFER FROM DR. 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    Screening:  Pap: 03/28/2019 NILM    Initial prenatal labs:  Date: 03/30/2019  RH status: A NEGATIVE  H/H: 13.6/39.8  Rubella: Immune  Varicella: Immune  HIV: Negative  Hep B: Negative  RPR: Non-reactive  Hep C:  Negative  UDS: Negative  GC/CT: Negative    Genetic screening:  First trimester: Declines  AFP: Declines  Horizon carrier screening: Declines  Fetal assessment scan:   1/11/20021 mobile, anterior, AFI WNL, EFW: 247 g, 87th percentile, unremarkable anatomy    28 week labs:  Date:  09/03/2019  H/H:  12.2/35.4  3 hr gtt:  102, 176, 159, 96          Immunizations:  Tdap: Given 3/30  Flu: +   Rhogam administration: If indicated    Other:  GBS: to be collected around 36 weeks  Date of discussion of breastfeeding and plan: Breast  Date of postpartum contraception discussion and plan: Undecided  Pain control in labor: No epidural  Circumcision if boy: N/A      History of Graves' disease   Hypothyroidism 05/29/2019     S/p ablation in 2018  [facility]  10/15 TSI: 3.9  12/7 TSH: 1.254  06/26/19: 1.14  09/03/19 TSH:  0.970  10/04/19: TSH: 0.878  Current Regimen: Synthroid 112 mcg x 6 days and 168 mg x 1 day  S/p MFM consult  Plan for growth [place] at 28, 18, and  36 weeks.  Ultrasound 09/26/19 growth in the 73rd percentile  Continue weekly testing with NST and fluid check  Plan for delivery at 39-[redacted] weeks gestation      Obesity   PCOS 05/29/2019     Body mass index is 32.8 kg/m.  Early 1 hour gtt: 144  06/12/2019: 3 hour--92, 153, 130, 99   Hemoglobin A1c: 4.9  09/03/2019:3 hour -- 102, 176, 159, 96.         GBS swab obtained today. Will follow with results.   Patient requests cervical check today. Reviewed with patient as above.  Elevated blood pressure without diagnosis of hypertension. HELLP labs 05/06 WNL. Patient normotensive on examination today. Patient is currently taking daily blood pressures at home. Patient forgot home BP log. Will send via MyChart to Dr. Ezequiel Kayser. Recommend patient continue with daily blood pressure checks and complete log to present at upcoming appointments.   History of Graves Disease. Patient reports that Dr. Zacarias Pontes is ordering and folloing her Thyroid panels at this time. Reviewed  recommendations for weekly NST's and fluid checks. Reactive NST completed in office today as above.  Growth US/AFI scheduled following appointment today.   [place] ordered for assessment of AFI; patient to complete in 1 week.   SROM/bleeding/FKC/PTL/preeclampsia precautions reviewed.  Follow up in 1 week for ROB, sooner PRN    S. Lace Kasserman-Marshall APRN, FNP-BC   10/25/2019 16:19  Department of Obstetrics & Gynecology

## 2019-10-28 LAB — GROUP B STREP

## 2019-10-29 ENCOUNTER — Encounter (HOSPITAL_COMMUNITY): Payer: Self-pay | Admitting: Student in an Organized Health Care Education/Training Program

## 2019-11-01 ENCOUNTER — Encounter (HOSPITAL_COMMUNITY): Payer: Self-pay | Admitting: Student in an Organized Health Care Education/Training Program

## 2019-11-01 ENCOUNTER — Ambulatory Visit: Payer: 59 | Attending: Family | Admitting: Family

## 2019-11-01 ENCOUNTER — Encounter (HOSPITAL_COMMUNITY): Payer: Self-pay | Admitting: Family

## 2019-11-01 ENCOUNTER — Ambulatory Visit (HOSPITAL_COMMUNITY): Payer: 59

## 2019-11-01 ENCOUNTER — Inpatient Hospital Stay (HOSPITAL_COMMUNITY): Payer: 59 | Admitting: Family

## 2019-11-01 ENCOUNTER — Other Ambulatory Visit: Payer: Self-pay

## 2019-11-01 ENCOUNTER — Inpatient Hospital Stay
Admission: RE | Admit: 2019-11-01 | Discharge: 2019-11-03 | DRG: 807 | Disposition: A | Discharge status: Home / Self Care | Payer: 59 | Source: Ambulatory Visit | Attending: Student in an Organized Health Care Education/Training Program | Admitting: Student in an Organized Health Care Education/Training Program

## 2019-11-01 VITALS — BP 140/88 | HR 103 | Temp 97.6°F | Wt 238.8 lb

## 2019-11-01 DIAGNOSIS — O139 Gestational [pregnancy-induced] hypertension without significant proteinuria, unspecified trimester: Secondary | ICD-10-CM

## 2019-11-01 DIAGNOSIS — O134 Gestational [pregnancy-induced] hypertension without significant proteinuria, complicating childbirth: Principal | ICD-10-CM | POA: Diagnosis present

## 2019-11-01 DIAGNOSIS — Z8639 Personal history of other endocrine, nutritional and metabolic disease: Secondary | ICD-10-CM

## 2019-11-01 DIAGNOSIS — Z3490 Encounter for supervision of normal pregnancy, unspecified, unspecified trimester: Secondary | ICD-10-CM

## 2019-11-01 DIAGNOSIS — O99284 Endocrine, nutritional and metabolic diseases complicating childbirth: Secondary | ICD-10-CM | POA: Diagnosis present

## 2019-11-01 DIAGNOSIS — Z79890 Hormone replacement therapy (postmenopausal): Secondary | ICD-10-CM

## 2019-11-01 DIAGNOSIS — Z3A37 37 weeks gestation of pregnancy: Secondary | ICD-10-CM

## 2019-11-01 DIAGNOSIS — Z20822 Contact with and (suspected) exposure to covid-19: Secondary | ICD-10-CM | POA: Diagnosis present

## 2019-11-01 DIAGNOSIS — O99213 Obesity complicating pregnancy, third trimester: Secondary | ICD-10-CM

## 2019-11-01 HISTORY — DX: Gestational (pregnancy-induced) hypertension without significant proteinuria, unspecified trimester: O13.9

## 2019-11-01 LAB — COMPREHENSIVE METABOLIC PANEL, NON-FASTING
ALBUMIN: 2.9 g/dL — ABNORMAL LOW (ref 3.5–5.0)
ALKALINE PHOSPHATASE: 93 U/L (ref 40–110)
ALT (SGPT): 8 U/L (ref 8–22)
ANION GAP: 9 mmol/L (ref 4–13)
AST (SGOT): 12 U/L (ref 8–45)
BILIRUBIN TOTAL: 0.4 mg/dL (ref 0.3–1.3)
BUN/CREA RATIO: 7 (ref 6–22)
BUN: 4 mg/dL — ABNORMAL LOW (ref 8–25)
CALCIUM: 8.7 mg/dL (ref 8.5–10.0)
CHLORIDE: 108 mmol/L (ref 96–111)
CO2 TOTAL: 20 mmol/L — ABNORMAL LOW (ref 22–30)
CREATININE: 0.57 mg/dL — ABNORMAL LOW (ref 0.60–1.05)
ESTIMATED GFR: >90 mL/min/BSA (ref >=60)
GLUCOSE: 101 mg/dL (ref 65–125)
POTASSIUM: 4 mmol/L (ref 3.5–5.1)
PROTEIN TOTAL: 6.4 g/dL (ref 6.4–8.3)
SODIUM: 137 mmol/L (ref 136–145)

## 2019-11-01 LAB — CBC
HCT: 35.7 % — ABNORMAL LOW (ref 37.0–47.0)
HGB: 12.6 g/dL (ref 12.5–16.0)
MCH: 31.5 pg (ref 28.0–33.0)
MCHC: 35.3 g/dL (ref 32.0–37.0)
MCV: 89.1 fL (ref 78.0–100.0)
MPV: 8.1 fL
PLATELETS: 176 10*3/uL (ref 130–400)
RBC: 4 10*6/uL — ABNORMAL LOW (ref 4.20–5.40)
RDW: 14.2 % (ref 9.9–16.5)
WBC: 10.4 10*3/uL (ref 4.5–11.0)

## 2019-11-01 LAB — URIC ACID: URIC ACID: 4.4 mg/dL (ref 2.9–6.3)

## 2019-11-01 LAB — PROTEIN/CREATININE RATIO, URINE, RANDOM
CREATININE RANDOM URINE: 24 mg/dL — ABNORMAL LOW (ref 50–100)
PROTEIN RANDOM URINE: 7 mg/dL
PROTEIN/CREATININE RATIO RANDOM URINE: 0.292 mg/g — ABNORMAL LOW (ref 10.000–105.000)

## 2019-11-01 LAB — TYPE AND SCREEN

## 2019-11-01 LAB — GOLD TOP TUBE

## 2019-11-01 LAB — URINALYSIS, MACROSCOPIC
BILIRUBIN: NEGATIVE mg/dL
GLUCOSE: NEGATIVE mg/dL
KETONES: NEGATIVE mg/dL
NITRITE: NEGATIVE
PH: 7 (ref 4.5–8.0)
PROTEIN: NEGATIVE mg/dL
SPECIFIC GRAVITY: 1.01 (ref 1.001–1.035)
UROBILINOGEN: 0.2 mg/dL (ref 0.2–1.0)

## 2019-11-01 LAB — URINALYSIS, MICROSCOPIC

## 2019-11-01 LAB — DRUG SCREEN, NO CONFIRMATION, URINE
AMPHETAMINES, URINE: NEGATIVE
BARBITURATES URINE: NEGATIVE
BENZODIAZEPINES URINE: NEGATIVE
BUPRENORPHINE URINE: NEGATIVE
CANNABINOIDS URINE: NEGATIVE
COCAINE METABOLITES URINE: NEGATIVE
CREATININE RANDOM URINE: 25 mg/dL — ABNORMAL LOW (ref 50–100)
ECSTASY/MDMA URINE: NEGATIVE
FENTANYL, RANDOM URINE: NEGATIVE
METHADONE URINE: NEGATIVE
OPIATES URINE (LOW CUTOFF): NEGATIVE
OXYCODONE URINE: NEGATIVE

## 2019-11-01 LAB — COVID-19, FLU A/B, RSV RAPID BY PCR - LAB USE ONLY
INFLUENZA VIRUS TYPE A: NOT DETECTED
INFLUENZA VIRUS TYPE B: NOT DETECTED
RESPIRATORY SYNCTIAL VIRUS (RSV): NOT DETECTED
SARS-CoV-2: NOT DETECTED

## 2019-11-01 MED ORDER — LIDOCAINE (PF) 10 MG/ML (1 %) INJECTION SOLUTION
2.00 mL | Freq: Once | INTRAMUSCULAR | Status: DC | PRN
Start: 2019-11-01 — End: 2019-11-03

## 2019-11-01 MED ORDER — MISOPROSTOL 200 MCG TABLET
1000.00 ug | ORAL_TABLET | Freq: Once | ORAL | Status: DC | PRN
Start: 2019-11-01 — End: 2019-11-03
  Filled 2019-11-01: qty 5

## 2019-11-01 MED ORDER — DIPHENHYDRAMINE 50 MG/ML INJECTION SOLUTION
25.00 mg | Freq: Four times a day (QID) | INTRAMUSCULAR | Status: DC | PRN
Start: 2019-11-01 — End: 2019-11-03

## 2019-11-01 MED ORDER — SODIUM CHLORIDE 0.9 % (FLUSH) INJECTION SYRINGE
3.00 mL | INJECTION | Freq: Three times a day (TID) | INTRAMUSCULAR | Status: DC
Start: 2019-11-01 — End: 2019-11-02

## 2019-11-01 MED ORDER — SODIUM CHLORIDE 0.9 % (FLUSH) INJECTION SYRINGE
3.00 mL | INJECTION | INTRAMUSCULAR | Status: DC | PRN
Start: 2019-11-01 — End: 2019-11-03

## 2019-11-01 MED ORDER — DINOPROSTONE ER 10 MG VAGINAL INSERT,CONTROLLED RELEASE
10.0000 mg | VAGINAL_INSERT | Freq: Once | VAGINAL | Status: AC
Start: 2019-11-01 — End: 2019-11-01
  Administered 2019-11-01: 10 mg via VAGINAL
  Filled 2019-11-01: qty 1

## 2019-11-01 MED ORDER — SODIUM CHLORIDE 0.9 % (FLUSH) INJECTION SYRINGE
3.00 mL | INJECTION | Freq: Three times a day (TID) | INTRAMUSCULAR | Status: DC
Start: 2019-11-01 — End: 2019-11-03

## 2019-11-01 MED ORDER — LACTATED RINGERS INTRAVENOUS SOLUTION
INTRAVENOUS | Status: DC
Start: 2019-11-01 — End: 2019-11-03

## 2019-11-01 MED ORDER — METHYLERGONOVINE 0.2 MG/ML (1 ML) INJECTION SOLUTION
0.20 mg | Freq: Once | INTRAMUSCULAR | Status: DC | PRN
Start: 2019-11-01 — End: 2019-11-03

## 2019-11-01 MED ORDER — LACTATED RINGERS IV BOLUS
500.00 mL | INJECTION | Status: DC | PRN
Start: 2019-11-01 — End: 2019-11-03

## 2019-11-01 MED ORDER — CARBOPROST TROMETHAMINE 250 MCG/ML INTRAMUSCULAR SOLUTION
250.00 ug | Freq: Once | INTRAMUSCULAR | Status: DC | PRN
Start: 2019-11-01 — End: 2019-11-03
  Filled 2019-11-01: qty 1

## 2019-11-01 MED ORDER — SODIUM CHLORIDE 0.9 % (FLUSH) INJECTION SYRINGE
3.00 mL | INJECTION | INTRAMUSCULAR | Status: DC | PRN
Start: 2019-11-01 — End: 2019-11-02

## 2019-11-01 MED ORDER — LEVOTHYROXINE 112 MCG TABLET
112.00 ug | ORAL_TABLET | Freq: Every morning | ORAL | Status: DC
Start: 2019-11-02 — End: 2019-11-03
  Administered 2019-11-02 – 2019-11-03 (×2): 112 ug via ORAL
  Filled 2019-11-01 (×3): qty 1

## 2019-11-01 MED ORDER — SODIUM CHLORIDE 0.9 % INTRAVENOUS SOLUTION
2.0000 m[IU]/min | INTRAVENOUS | Status: DC
Start: 2019-11-02 — End: 2019-11-02
  Administered 2019-11-02: 0.002 [IU]/min via INTRAVENOUS
  Administered 2019-11-02: 8 m[IU]/min via INTRAVENOUS
  Administered 2019-11-02: 4 m[IU]/min via INTRAVENOUS
  Administered 2019-11-02: 6 m[IU]/min via INTRAVENOUS
  Filled 2019-11-01: qty 3

## 2019-11-01 NOTE — Care Plan (Signed)
Reviewed cervical ripening and induction of labor plan and patient preference to avoid epidural. Educated on purpose of cervidil.

## 2019-11-01 NOTE — Nurses Notes (Signed)
PT STILL COMFORTABLE STATES CONTX ONLY ABOUT A 2 ON THE PAIN SCALE  SAME AS EARLIER.

## 2019-11-01 NOTE — Nurses Notes (Signed)
Dr.kowcheck into see patient discussed plan for cervical ripening and induction with pitocin in am. Patient agrees with plan of care. Cereal crackers fruit cup and juice provided.

## 2019-11-01 NOTE — Progress Notes (Signed)
[facility]    Labor Note      DATE OF SERVICE: 11/01/2019, 19:01   PATIENT: [name]  CHART NUMBER: [id number]      S: patient is sitting up in bed. She reports some mild contractions    O:   Filed Vitals:    11/01/19 1714 11/01/19 1732 11/01/19 1802 11/01/19 1832   BP: (!) 131/99 120/81 117/78 114/78   Pulse: 93 97 93 95   Resp:  16  16   Temp:  36.7 C (98.1 F)         SVE: Deferred    FHT: 140s Baseline, Moderate Variability, + Accels, No Decels  Toco: Ctx Q 3-4 mins    A/P: 29 y.o. G2P1001 @ [redacted]w[redacted]d     IOL--GHTN.   SVE 1/50/-3 at 1230  EFM Cat 1, reassuring.   CTX q 3-4 min on Toco  Cervidil in place  GBS negative: PCN not indicated  Pain control: None  Repeat labs in AM  Plan for rest overnight after cervidil removal then start pitocin in morning    Dema Severin, MD 11/01/2019 19:01  Department of Obstetrics & Gynecology

## 2019-11-01 NOTE — Nurses Notes (Signed)
Tolerated regular lunch tray.  Monitors readjusted.

## 2019-11-01 NOTE — Care Plan (Signed)
REVIEWED INDUCTION PROCESS AND CERVIDIL AND PITOCIN MEDS . PT VERBALIZED UNDERSTANDING

## 2019-11-01 NOTE — H&P (Signed)
[facility]    HISTORY AND PHYSICAL     PATIENT: [name]  CHART NUMBER: [id number]  DATE OF SERVICE: 11/01/2019      CC: Gestational hypertension    HPI: [name] is a 29 y.o. G2P1001 at [redacted]w[redacted]d who presents to labor and delivery from clinic due to new diagnosis of gestational hypertension. Given current gestational age and diagnosis, patient was sent to labor and delivery for induction of labor. She reports good fetal movement. She denies contractions, vaginal bleeding, leaking of fluid, chest pain, shortness of breath, headache, vision changes, nausea, vomiting, abdominal pain, and edema.     ROD:  Dating Summary      Working EDD: 11/21/19 set by Jeneen Rinks, Ambulatory Care Assistant on 05/29/19 based on Ultrasound on 03/28/19          Based On EDD GA Diff GA User Date    Last Menstrual Period on 01/14/19 (Exact Date)   10/21/19 +[redacted]w[redacted]d  System action - copied 05/29/19    Ultrasound on 03/28/19   11/21/19 Working [redacted]w[redacted]d Jeneen Rinks, Ambulatory Care Assistant 05/29/19           OB History:  Lab Results   Component Value Date    ABORHD A NEGATIVE 09/03/2019    HGB 12.6 11/01/2019    HCT 35.7 (L) 11/01/2019       OB History   Gravida Para Term Preterm AB Living   2 1 1     1    SAB TAB Ectopic Multiple Live Births         0 1      # Outcome Date GA Lbr Len/2nd Weight Sex Delivery Anes PTL Lv   2 Current            1 Term 04/20/14 [redacted]w[redacted]d 21:30 / 00:13 2.58 kg (5 lb 11 oz) F Vag-Spont Local N LIV      Birth Comments: Born at 40 weeks via vaginal delivery. AGA. No prenatal or post natal complications. Breast feeding 20 minutes at a time per feed. Patient tolerating feeds well with regular stooling and multiple wet diapers. Hepatitis B vaccine was administered. Patient passed Hearing screen and O2. Patient was discharged at 36 hours since patient was doing well and parents were instructed to bring child tomorrow to [facility] for newborn screen.  Patient otherwise will follow-up with [facility].       PAST MEDICAL HISTORY:  Past Medical History:   Diagnosis Date   . Gestational hypertension 11/01/2019   . Graves disease    . PCOS (polycystic ovarian syndrome)    . Seasonal allergic reaction     occasional OTC meds     PAST SURGICAL HISTORY:  Past Surgical History:   Procedure Laterality Date   . HX WISDOM TEETH EXTRACTION  2010     FAMILY HISTORY:   No FH of BD, MR, SZs or bleeding/clotting disorders  Family Medical History:     Problem Relation (Age of Onset)    Eclampsia Sister    Hypertension (High Blood Pressure) Mother    Spont Ab Mother, Sister, Sister        SOCIAL HISTORY:  Denies drug use  Social History     Tobacco Use   . Smoking status: Never Smoker   . Smokeless tobacco: Never Used   Vaping Use   . Vaping Use: Never used   Substance Use Topics   . Alcohol use: No     Comment: occasionally  once per week   . Drug use: No        CURRENT MEDICATIONS:   Medications Prior to Admission     Prescriptions    levothyroxine (SYNTHROID) 112 mcg Oral Tablet    Take one pill daily Monday through Saturday and 1/2 pill on Sunday.    PNV Combo #19-Iron-Fol Ac-DHA 22-6-1-200 mg Oral Capsule    Take 1 Cap by mouth Once a day           ALLERGIES:  Patient has no known allergies.     REVIEW OF SYSTEMS: Other than ROS in the HPI, all other systems were negative.    PHYSICAL EXAMINATION:   Filed Vitals:    11/01/19 0919 11/01/19 0936 11/01/19 1015 11/01/19 1043   BP: 106/67 101/64 98/60 118/76   Pulse: (!) 104 96 100 90   Resp: 16  16    Temp:           General: appears in good health and no distress  HENNT: NCAT, no cervical LAD  Lungs: Clear to auscultation bilaterally.   Cardiovascular: regular rate and rhythm  Abdomen: Gravid. Soft, non-tender  Extremities: No cyanosis or edema  Neuro: Patellar DTR +2 bilaterally, no clonus     SVE: 1/50/-3 per RN  SSE: Deferred    FHRT:  135 baseline. Moderate variability. + Accels. No decels.  TOCO: Irregular  contractions ---Patient denies    CURRENT LABS:   Component      Latest Ref Rng & Units 11/01/2019   SODIUM      136 - 145 mmol/L 137   POTASSIUM      3.5 - 5.1 mmol/L 4.0   CHLORIDE      96 - 111 mmol/L 108   CARBON DIOXIDE      22 - 30 mmol/L 20 (L)   ANION GAP      4 - 13 mmol/L 9   BUN      8 - 25 mg/dL 4 (L)   CREATININE      0.60 - 1.05 mg/dL 0.57 (L)   BUN/CREAT RATIO      6 - 22 7   ESTIMATED GLOMERULAR FILTRATION RATE      >=60 mL/min/BSA >90   ALBUMIN      3.5 - 5.0 g/dL  2.9 (L)   CALCIUM      8.5 - 10.0 mg/dL 8.7   GLUCOSE      65 - 125 mg/dL 101   ALKALINE PHOSPHATASE      40 - 110 U/L 93   ALT (SGPT)      8 - 22 U/L 8   AST (SGOT)      8 - 45 U/L 12   BILIRUBIN, TOTAL      0.3 - 1.3 mg/dL 0.4   TOTAL PROTEIN      6.4 - 8.3 g/dL 6.4   WBC      4.5 - 11.0 x10^3/uL 10.4   RBC      4.20 - 5.40 x10^6/uL 4.00 (L)   HGB      12.5 - 16.0 g/dL 12.6   HCT      37.0 - 47.0 % 35.7 (L)   MCV      78.0 - 100.0 fL 89.1   MCH      28.0 - 33.0 pg 31.5   MCHC      32 .0 - 37.0 g/dL   RDW      9.9 - 91.7 %  14.2   PLATELET COUNT      130 - 400 x10^3/uL 176   MPV      fL 8.1   URIC ACID      2.9 - 6.3 mg/dL 4.4         ASSESSMENT/PLAN: 29 y.o. G2P1001 at [redacted]w[redacted]d    Gestational Hypertension  Admit to labor and delivery  Labs ordered: CBC, type and screen, CMP, Pr/Cr ratio, COVID 19 screening, urine drug screen, and syphilis  Category 1 tracing, reassuring   Continuous EFM and TOCO  LR @ 125 mL/hr  GBS negative  Patient may eat breakfast/lunch then plan to start induction  Plan for cervical ripening with cervidil    Patient Active Problem List    Diagnosis Date Noted   . Gestational hypertension 11/01/2019   . Elevated BP without diagnosis of hypertension 10/14/2019     Elevated BP on 10/10/2019  HELLP labs WNL and Pr/Cr ratio NC  Discussed home BP monitoring.     . Prenatal care 05/29/2019     TRANSFER FROM DR. 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     Screening:  Pap: 03/28/2019 NILM    Initial prenatal labs:  Date: 03/30/2019  RH status: A NEGATIVE  H/H: 13.6/39.8  Rubella: Immune  Varicella: Immune  HIV: Negative  Hep B: Negative  RPR: Non-reactive  Hep C: Negative  UDS: Negative  GC/CT: Negative    Genetic screening:  First trimester: Declines  AFP: Declines  Horizon carrier screening: Declines  Fetal assessment scan:   1/11/20021 mobile, anterior, AFI WNL, EFW: 247 g, 87th percentile, unremarkable anatomy    28 week labs:  Date:  09/03/2019  H/H:  12.2/35.4  3 hr gtt:  102, 176, 159, 96          Immunizations:  Tdap: Given 3/30  Flu: +   Rhogam administration: If indicated    Other:  GBS: negative  Date of discussion of breastfeeding and plan: Breast  Date of postpartum contraception discussion and plan: Undecided  Pain control in labor: No epidural  Circumcision if boy: N/A     . History of Graves' disease  Hypothyroidism 05/29/2019     S/p ablation in 2018  Follows with Dr. Chrystine [facility]iler at [facility]  10/15 TSI: 3.9  12/7 TSH: 1.254  06/26/19: 1.14  09/03/19 TSH:  0.970  10/04/19: TSH: 0.878  Current Regimen: Synthroid 112 mcg x 6 days and 168 mg x 1 day  S/p MFM consult   Plan for growth [place] at 28, 32, and 36 weeks.   Ultrasound 09/26/19 growth in the 73rd percentile   Ultrasound 10/25/2019 growth in the 62nd percentile   Continue weekly testing with NST and fluid check   Plan for delivery at 39-[redacted] weeks gestation     . Obesity  PCOS 05/29/2019     Body mass index is 32.8 kg/m.  Early 1 hour gtt: 144  06/12/2019: 3 hour--92, 153, 130, 99   Hemoglobin A1c: 4.9  09/03/2019:3 hour -- 102, 176, 159, 96.           [name], MD 11/01/2019 10:58  Department of Obstetrics & Gynecology

## 2019-11-01 NOTE — Progress Notes (Signed)
[facility]    RETURN OBSTETRICAL ENCOUNTER      DATE OF SERVICE: 11/01/2019, 08:12   PATIENT: [name]  CHART NUMBER: [id number]    Subjective:  29 y.o. G2P1001 at [redacted]w[redacted]d presenting for [place] visit.   Patient is doing well.  Patient reports a small amount milky discharge x7 days with clear watery discharge occurring yesterday evening, potentially leaking of fluid. Patient otherwise is without complaints. She reports positive fetal kick counts. She denies contractions, vaginal bleeding, chest pain, shortness of breath, headache, vision changes, nausea, vomiting, abdominal pain, and edema.     Dating Summary        Working EDD: 11/21/19 set by Jearld Shines, Ambulatory Care Assistant on 05/29/19 based on Ultrasound on 03/28/19              Based On EDD GA Diff GA User Date    Last Menstrual Period on 01/14/19 (Exact Date)   10/21/19 +[redacted]w[redacted]d  System action - copied 05/29/19    Ultrasound on 03/28/19   11/21/19 Working [redacted]w[redacted]d Jearld Shines, Ambulatory Care Assistant 05/29/19               Objective:    Weight: 108 kg (238 lb 12.8 oz)  BP (Non-Invasive): (!) 140/88  Protein: Negative  Glucose: Negative    Microbiology 10/25/2019: Group B Strep Negative    Ultrasound 10/25/2019:  AUA 36+ 5 weeks, EFW 2966 g/6 lb 8 oz, 62.8 percentile, vertex presentation, anterior placenta, AFI 11.1 cm      A/P:  29 y.o. G2P1001 at [redacted]w[redacted]d for ROB with report of clear watery discharge, with elevated blood pressures on examination consistent with gestational hypertension being sent to labor and delivery following examination for induction of labor     Patient Active Problem List    Diagnosis Date Noted    Elevated BP without diagnosis of hypertension 10/14/2019     Elevated BP on 10/10/2019  HELLP labs WNL and Pr/Cr ratio NC  Discussed home BP monitoring.      Prenatal care 05/29/2019     TRANSFER FROM DR. 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    Screening:  Pap: 03/28/2019 NILM    Initial prenatal labs:  Date: 03/30/2019  RH status: A NEGATIVE  H/H: 13.6/39.8  Rubella: Immune  Varicella: Immune  HIV: Negative  Hep B: Negative  RPR: Non-reactive  Hep C: Negative  UDS: Negative  GC/CT: Negative    Genetic screening:  First trimester: Declines  AFP: Declines  Horizon carrier screening: Declines  Fetal assessment scan:   1/11/20021 mobile, anterior, AFI WNL, EFW: 247 g, 87th percentile, unremarkable anatomy    28 week labs:  Date:  09/03/2019  H/H:  12.2/35.4  3 hr gtt:  102, 176, 159, 96          Immunizations:  Tdap: Given 3/30  Flu: +   Rhogam administration: If indicated    Other:  GBS: to be collected around 36 weeks  Date of discussion of breastfeeding and plan: Breast  Date of postpartum contraception discussion and plan: Undecided  Pain control in labor: No epidural  Circumcision if boy: N/A      History of Graves' disease  Hypothyroidism 05/29/2019     S/p ablation in 2018  Follows with Dr. Chrystine O[facility] at [facility]  10/15 TSI: 3.9  12/7 TSH: 1.254  06/26/19: 1.14  09/03/19 TSH:  0.970  10/04/19: TSH:  0.878  Current Regimen: Synthroid 112 mcg x 6 days and 168 mg x 1 day  S/p MFM consult  Plan for growth [place] at 28, 32, and 36 weeks.  Ultrasound 09/26/19 growth in the 73rd percentile  Continue weekly testing with NST and fluid check  Plan for delivery at 39-[redacted] weeks gestation      Obesity  PCOS 05/29/2019     Body mass index is 32.8 kg/m.  Early 1 hour gtt: 144  06/12/2019: 3 hour--92, 153, 130, 99   Hemoglobin A1c: 4.9  09/03/2019:3 hour -- 102, 176, 159, 96.           GBS reviewed with patient and on chart as above  Patient reports clear, watery discharge that started last evening. Patient is to present to labor and delivery following examination today for further evaluation.  Hypertensive on examination today: 140/88, 131/86 . Patient counseled that as she had a previous elevation on 10/10/2019 evaluation today is consistent with gestational  hypertension. HELLP labs 05/06 WNL.  Patient is currently taking daily blood pressures at home and reports highest reading being 138/90 but is unable to present log at this time.   Maternal/fetal risks associated with gestational hypertension to include increased risk for SAB, IUFD, IUGR, preterm delivery, abruption and preeclampsia reviewed.   Reviewed recommendation for delivery at [redacted] weeks EGA.  Patient instructed to report to labor and delivery immediately following appointment today for further evaluation and induction of labor. Dr. Ezequiel Kayser is aware and will assume care of patient at that time. Patient verbalizes understanding.  History of Graves Disease. Patient reports that Dr. Zacarias Pontes is ordering and following her Thyroid panels at this time. Reviewed recommendations for weekly NST's and fluid checks.   NST to be completed in labor and delivery following appointment today.  Ultrasound for AFI reviewed with patient and on chart as above  SROM/bleeding/FKC/Labor/preeclampsia precautions reviewed.  Follow up on labor and delivery immediately following examination today for induction of labor.         Doreene Nest APRN, FNP-BC   11/01/2019 09:41  Department of Obstetrics & Gynecology

## 2019-11-02 ENCOUNTER — Encounter (HOSPITAL_COMMUNITY): Payer: Self-pay | Admitting: Family

## 2019-11-02 DIAGNOSIS — O134 Gestational [pregnancy-induced] hypertension without significant proteinuria, complicating childbirth: Principal | ICD-10-CM

## 2019-11-02 DIAGNOSIS — Z3A37 37 weeks gestation of pregnancy: Secondary | ICD-10-CM

## 2019-11-02 LAB — COMPREHENSIVE METABOLIC PANEL, NON-FASTING
ALBUMIN: 3 g/dL — ABNORMAL LOW (ref 3.5–5.0)
ALKALINE PHOSPHATASE: 97 U/L (ref 40–110)
ALT (SGPT): 7 U/L — ABNORMAL LOW (ref 8–22)
ANION GAP: 9 mmol/L (ref 4–13)
AST (SGOT): 13 U/L (ref 8–45)
BILIRUBIN TOTAL: 0.8 mg/dL (ref 0.3–1.3)
BUN/CREA RATIO: 7 (ref 6–22)
BUN: 4 mg/dL — ABNORMAL LOW (ref 8–25)
CALCIUM: 9.1 mg/dL (ref 8.5–10.0)
CHLORIDE: 105 mmol/L (ref 96–111)
CO2 TOTAL: 22 mmol/L (ref 22–30)
CREATININE: 0.61 mg/dL (ref 0.60–1.05)
ESTIMATED GFR: >90 mL/min/BSA (ref >=60)
GLUCOSE: 102 mg/dL (ref 65–125)
POTASSIUM: 3.9 mmol/L (ref 3.5–5.1)
PROTEIN TOTAL: 6.5 g/dL (ref 6.4–8.3)
SODIUM: 136 mmol/L (ref 136–145)

## 2019-11-02 LAB — CBC
HCT: 36.5 % — ABNORMAL LOW (ref 37.0–47.0)
HGB: 12.8 g/dL (ref 12.5–16.0)
MCH: 31.2 pg (ref 28.0–33.0)
MCHC: 35 g/dL (ref 32.0–37.0)
MCV: 88.9 fL (ref 78.0–100.0)
MPV: 8.2 fL
PLATELETS: 181 10*3/uL (ref 130–400)
RBC: 4.1 10*6/uL — ABNORMAL LOW (ref 4.20–5.40)
RDW: 14.1 % (ref 9.9–16.5)
WBC: 11.5 10*3/uL — ABNORMAL HIGH (ref 4.5–11.0)

## 2019-11-02 LAB — SYPHILIS SCREENING ALGORITHM WITH REFLEX (TITER, TP-PA), SERUM: TREPONEMAL AB QUALITATIVE: NONREACTIVE

## 2019-11-02 MED ORDER — LIDOCAINE (PF) 10 MG/ML (1 %) INJECTION SOLUTION
INTRAMUSCULAR | Status: AC
Start: 2019-11-02 — End: 2019-11-02
  Administered 2019-11-02: 300 mg
  Filled 2019-11-02: qty 30

## 2019-11-02 MED ORDER — IBUPROFEN 600 MG TABLET
600.00 mg | ORAL_TABLET | Freq: Four times a day (QID) | ORAL | Status: DC | PRN
Start: 2019-11-02 — End: 2019-11-03
  Administered 2019-11-02: 600 mg via ORAL
  Filled 2019-11-02: qty 1

## 2019-11-02 MED ORDER — LIDOCAINE (PF) 10 MG/ML (1 %) INJECTION SOLUTION
30.0000 mL | Freq: Once | INTRAMUSCULAR | Status: DC
Start: 2019-11-02 — End: 2019-11-03

## 2019-11-02 MED ORDER — BENZOCAINE 20 %-MENTHOL 0.5 % TOPICAL AEROSOL
1.00 | INHALATION_SPRAY | Freq: Four times a day (QID) | CUTANEOUS | Status: DC | PRN
Start: 2019-11-02 — End: 2019-11-03
  Administered 2019-11-02: 1 via TOPICAL
  Filled 2019-11-02: qty 56

## 2019-11-02 MED ORDER — LIDOCAINE (PF) 10 MG/ML (1 %) INJECTION SOLUTION
INTRAMUSCULAR | Status: DC
Start: 2019-11-02 — End: 2019-11-02
  Filled 2019-11-02: qty 30

## 2019-11-02 MED ORDER — RHO(D) IMMUNE GLOBULIN 1,500 UNIT (300 MCG) INTRAMUSCULAR SYRINGE
300.0000 ug | INJECTION | Freq: Once | INTRAMUSCULAR | Status: DC
Start: 2019-11-03 — End: 2019-11-03
  Filled 2019-11-02: qty 300

## 2019-11-02 MED ORDER — DOCUSATE SODIUM 100 MG CAPSULE
100.00 mg | ORAL_CAPSULE | Freq: Two times a day (BID) | ORAL | Status: DC | PRN
Start: 2019-11-02 — End: 2019-11-03

## 2019-11-02 MED ORDER — LANOLIN TOPICAL CREAM
TOPICAL_CREAM | CUTANEOUS | Status: DC | PRN
Start: 2019-11-02 — End: 2019-11-03

## 2019-11-02 MED ORDER — SODIUM CHLORIDE 0.9 % (FLUSH) INJECTION SYRINGE
3.00 mL | INJECTION | Freq: Three times a day (TID) | INTRAMUSCULAR | Status: DC
Start: 2019-11-02 — End: 2019-11-03

## 2019-11-02 MED ORDER — RHO(D) IMMUNE GLOBULIN 1,500 UNIT (300 MCG) INTRAMUSCULAR SYRINGE
300.0000 ug | INJECTION | Freq: Once | INTRAMUSCULAR | Status: DC
Start: 2019-11-02 — End: 2019-11-02
  Filled 2019-11-02: qty 300

## 2019-11-02 MED ORDER — ACETAMINOPHEN 325 MG TABLET
650.00 mg | ORAL_TABLET | ORAL | Status: DC | PRN
Start: 2019-11-02 — End: 2019-11-03

## 2019-11-02 MED ORDER — SODIUM CHLORIDE 0.9 % INTRAVENOUS SOLUTION
334.0000 m[IU]/min | INTRAVENOUS | Status: AC
Start: 2019-11-02 — End: 2019-11-02
  Administered 2019-11-02: 0.334 [IU]/min via INTRAVENOUS

## 2019-11-02 MED ORDER — SODIUM CHLORIDE 0.9 % INTRAVENOUS SOLUTION
4.0000 m[IU]/min | INTRAVENOUS | Status: DC
Start: 2019-11-02 — End: 2019-11-02
  Administered 2019-11-02: 12 m[IU]/min via INTRAVENOUS

## 2019-11-02 MED ORDER — SODIUM CHLORIDE 0.9 % (FLUSH) INJECTION SYRINGE
3.00 mL | INJECTION | INTRAMUSCULAR | Status: DC | PRN
Start: 2019-11-02 — End: 2019-11-03

## 2019-11-02 NOTE — Nurses Notes (Signed)
Report received from [facility].  Resumed care of mom and baby at this time.

## 2019-11-02 NOTE — Care Plan (Signed)
Problem: Adult Inpatient Plan of Care  Goal: Plan of Care Review  Outcome: Ongoing (see interventions/notes)  Goal: Patient-Specific Goal (Individualized)  Outcome: Ongoing (see interventions/notes)  Goal: Absence of Hospital-Acquired Illness or Injury  Outcome: Ongoing (see interventions/notes)  Goal: Optimal Comfort and Wellbeing  Outcome: Ongoing (see interventions/notes)  Goal: Rounds/Family Conference  Outcome: Ongoing (see interventions/notes)

## 2019-11-02 NOTE — Nurses Notes (Signed)
PT OFF THE MONITOR TO REST WILL START PITOCIN AT 5 AM AS ORDERED

## 2019-11-02 NOTE — Care Plan (Signed)
REINFORCED POST PARTUM CARE AND BLEEDING .EDUCATED ABOUT DERMAPLAST AND MOTRIN

## 2019-11-02 NOTE — Care Plan (Signed)
Discussed plan of care with patient and husband  

## 2019-11-02 NOTE — Nurses Notes (Signed)
Dr Lisette Grinder notified pt complete.

## 2019-11-02 NOTE — Progress Notes (Signed)
Department of Obstetrics and Gynecology  [facility]    Labor Progress Note     Name: [name] MRN:  [id number]   Date: 11/02/2019 Age: 29 y.o.     S:  Assuming care of patient.  Briefly, patient is a 29 year old gravida 2 para 1001 at 37+ [redacted] weeks EGA admitted for induction of labor secondary to gestational hypertension.  PIH laboratories on admission notable for a protein/creatinine ratio 0.292.  Repeat CMP and CBC this more unremarkable.  This morning, patient is without complaints.  Pitocin initiated at 0500.  Reports contractions however, does not request anything for pain management at this time.    O:  BP 126/80   Pulse 81   Temp 36.3 C (97.3 F)   Resp 17   Ht 1.702 m (5\' 7" )   Wt 108 kg (238 lb)   LMP 01/14/2019 (Exact Date)   SpO2 98%   BMI 37.28 kg/m     General appearance:  Patient in bed, no apparent distress  Cervix:  3/80/-2, vertex, AROM with clear fluid    FHRT:  Normal baseline fetal heart rate; positive accelerations; no decelerations; category 1 tracing with moderate variability; toco with contractions every 2-5 minutes    A/P:  IUP at 37+ [redacted] weeks EGA for induction of labor secondary to gestational hypertension    - continue Pitocin  - continue to monitor  - anesthesia consult p.r.n.  11-25-1986 MD

## 2019-11-02 NOTE — Care Plan (Signed)
Problem: Adult Inpatient Plan of Care  Goal: Plan of Care Review  Outcome: Ongoing (see interventions/notes)  Goal: Patient-Specific Goal (Individualized)  Outcome: Ongoing (see interventions/notes)  Goal: Absence of Hospital-Acquired Illness or Injury  Outcome: Ongoing (see interventions/notes)  Goal: Optimal Comfort and Wellbeing  Outcome: Ongoing (see interventions/notes)  Goal: Rounds/Family Conference  Outcome: Ongoing (see interventions/notes)     Problem: Adjustment to Role Transition (Postpartum Vaginal Delivery)  Goal: Successful Maternal Role Transition  Outcome: Ongoing (see interventions/notes)     Problem: Bleeding (Postpartum Vaginal Delivery)  Goal: Hemostasis  Outcome: Ongoing (see interventions/notes)     Problem: Infection (Postpartum Vaginal Delivery)  Goal: Absence of Infection Signs and Symptoms  Outcome: Ongoing (see interventions/notes)     Problem: Pain (Postpartum Vaginal Delivery)  Goal: Acceptable Pain Control  Outcome: Ongoing (see interventions/notes)     Problem: Urinary Retention (Postpartum Vaginal Delivery)  Goal: Effective Urinary Elimination  Outcome: Ongoing (see interventions/notes)

## 2019-11-03 LAB — COMPREHENSIVE METABOLIC PANEL, NON-FASTING
ALBUMIN: 2.7 g/dL — ABNORMAL LOW (ref 3.5–5.0)
ALKALINE PHOSPHATASE: 83 U/L (ref 40–110)
ALT (SGPT): 9 U/L (ref 8–22)
ANION GAP: 9 mmol/L (ref 4–13)
AST (SGOT): 16 U/L (ref 8–45)
BILIRUBIN TOTAL: 0.6 mg/dL (ref 0.3–1.3)
BUN/CREA RATIO: 7 (ref 6–22)
BUN: 4 mg/dL — ABNORMAL LOW (ref 8–25)
CALCIUM: 8.5 mg/dL (ref 8.5–10.0)
CHLORIDE: 107 mmol/L (ref 96–111)
CO2 TOTAL: 20 mmol/L — ABNORMAL LOW (ref 22–30)
CREATININE: 0.59 mg/dL — ABNORMAL LOW (ref 0.60–1.05)
ESTIMATED GFR: >90 mL/min/BSA (ref >=60)
GLUCOSE: 90 mg/dL (ref 65–125)
POTASSIUM: 3.8 mmol/L (ref 3.5–5.1)
PROTEIN TOTAL: 5.8 g/dL — ABNORMAL LOW (ref 6.4–8.3)
SODIUM: 136 mmol/L (ref 136–145)

## 2019-11-03 LAB — CBC
HCT: 34.1 % — ABNORMAL LOW (ref 37.0–47.0)
HGB: 12.1 g/dL — ABNORMAL LOW (ref 12.5–16.0)
MCH: 31.7 pg (ref 28.0–33.0)
MCHC: 35.5 g/dL (ref 32.0–37.0)
MCV: 89.2 fL (ref 78.0–100.0)
MPV: 8.4 fL
PLATELETS: 177 10*3/uL (ref 130–400)
RBC: 3.83 10*6/uL — ABNORMAL LOW (ref 4.20–5.40)
RDW: 14.1 % (ref 9.9–16.5)
WBC: 13.8 10*3/uL — ABNORMAL HIGH (ref 4.5–11.0)

## 2019-11-03 LAB — ABO/RH AND ANTIBODY SCREEN
ABO/RH(D): A NEG
ANTIBODY SCREEN: POSITIVE

## 2019-11-03 NOTE — Nurses Notes (Signed)
Discharge instructions given to patient. Patient verbalized understanding.

## 2019-11-03 NOTE — Progress Notes (Signed)
Department of Obstetrics and Gynecology  [facility]    Postpartum Note     Name: [name] MRN:  [id number]   Date: 11/03/2019 Age: 29 y.o.     S:  Patient is a 29 year old gravida 2 para 2002 postpartum day number 1 status post SVD.  This morning, patient is without complaints.  Ambulating and voiding without difficulty and tolerating a regular diet.  Reports good pain control and minimal lochia.  Requests discharge home    O:  Vitals: BP 127/79   Pulse 83   Temp 36.7 C (98.1 F)   Resp 20   Ht 1.702 m (5\' 7" )   Wt 108 kg (238 lb)   LMP 01/14/2019 (Exact Date)   SpO2 94%   Breastfeeding Yes   BMI 37.28 kg/m     General appearance:  Patient in bed, no apparent distress  Heart:  Regular rate and rhythm  Lungs:  Clear to auscultation bilaterally  Abdomen:  Soft, nondistended and nontender; fundus firm at U -2  Extremities:  Negative Homans bilaterally    Laboratories:  WBC 13.8, hemoglobin 12.1, hematocrit 34.1, platelets 177, BUN 4, creatinine 0.59, AST 16, ALT 9    A/P:  Postpartum day number 1 status post SVD currently, doing well  1. Prenatal course complicated by gestational hypertension.  Blood pressure is well controlled since admission.  PIH laboratories this morning reviewed as above.  Unremarkable.  2. Patient requests discharge to home today.  Will plan discharge pending peds evaluation.    3. Discharge teaching and precautions reviewed.    03/16/2019 MD

## 2019-11-03 NOTE — Discharge Summary (Signed)
[facility]  DISCHARGE SUMMARY    PATIENT NAME:  [name], [name]  MRN:  [id number]  DOB:  1990-11-23    ENCOUNTER DATE:  11/01/2019  INPATIENT ADMISSION DATE: 11/01/2019  DISCHARGE DATE:  11/03/2019    ATTENDING PHYSICIAN: Dagoberto Ligas, MD  SERVICE: [facility] OBSTETR[facility]  PRIMARY CARE PHYSICIAN: No Pcp         LAY CAREGIVER:  ,  ,        PRIMARY DISCHARGE DIAGNOSIS:    Active Hospital Problems    Diagnosis Date Noted   . Gestational hypertension [O13.9] 11/01/2019      Resolved Hospital Problems   No resolved problems to display.     Active Non-Hospital Problems    Diagnosis Date Noted   . Elevated BP without diagnosis of hypertension 10/14/2019   . Prenatal care 05/29/2019   . History of Graves' disease  Hypothyroidism 05/29/2019   . Obesity  PCOS 05/29/2019        DISCHARGE MEDICATIONS:     Current Discharge Medication List      CONTINUE these medications - NO CHANGES were made during your visit.      Details   levothyroxine 112 mcg Tablet  Commonly known as: SYNTHROID   Take one pill daily Monday through Saturday and 1/2 pill on Sunday.  Qty: 90 Tablet  Refills: 3     PNV Combo #19-Iron-Fol Ac-DHA 22-6-1-200 mg Capsule   1 Capsule, Oral, DAILY  Qty: 30 Cap  Refills: 5          Discharge med list refreshed?  YES                     ALLERGIES:  No Known Allergies          HOSPITAL PROCEDURE(S):   Bedside Procedures:  No orders of the defined types were placed in this encounter.    Surgical     REASON FOR HOSPITALIZATION AND HOSPITAL COURSE     BRIEF HPI:  This is a 29 y.o., female admitted for delivery    BRIEF HOSPITAL NARRATIVE:  29 year old gravida 2 now para 2002 admitted at 37+ weeks EGA for induction of labor secondary to gestational hypertension.  Patient underwent Cervidil followed by Pitocin induction.  Progressed to deliver a viable female infant via uncomplicated SVD.  On postpartum day 1., patient requested discharge home.  At that time, patient was ambulating voiding without  difficulty, tolerating a regular diet, reported good pain control and minimal lochia.  Patient denied any signs/symptoms of preeclampsia and blood pressures remained well controlled throughout her stay.  PIH laboratories on day of discharge were unremarkable.  Patient was instructed to initiate daily blood pressure checks and follow up in the office in 2 weeks for postpartum examination       TRANSITION/POST DISCHARGE CARE/PENDING TESTS/REFERRALS:  None        CONDITION ON DISCHARGE:  A. Ambulation: Full ambulation  B. Self-care Ability: Complete  C. Cognitive Status Alert  D. Code status at discharge:   Code Status Information     Code Status    Full Code                 LINES/DRAINS/WOUNDS AT DISCHARGE:   Patient Lines/Drains/Airways Status    Active Line / Dialysis Catheter / Dialysis Graft / Drain / Airway / Wound     Name: Placement date: Placement time: Site: Days:    Peripheral IV Left;Posterior Dorsal Metacarpals  (top of hand)  11/01/19   0950   1                DISCHARGE DISPOSITION:  Home discharge              DISCHARGE INSTRUCTIONS:       DISCHARGE INSTRUCTION - ACTIVITY    LIGHT ACTIVITY, NO LIFTING, PELVIC REST, NO BATHS, NO SWIMMING, NO DRIVING FOR 2 WEEKS; PELVIC REST--NOTHING VAGINALLY UNTIL CLEARED BY THE PHYSICIAN     Activity: AS INSTRUCTED      DISCHARGE INSTRUCTION - DIET - RESUME HOME DIET     Diet: RESUME HOME DIET      DISCHARGE INSTRUCTION - MISC    - call/return for fevers, chills, nausea, vomiting, increased bleeding greater than 1 pad per hour or any signs/symptoms of preeclampsia to include headache, visual changes, increased swelling of hands/face or increased pain in the abdomen    - initiate daily blood pressure checks; call for any blood pressures greater than 546 systolic or greater than 503 diastolic    - call [phone] for any questions/concerns    - return to the office in 2 weeks for postpartum examination                Duane Lope, MD    Copies sent to Care Team        Relationship Specialty Notifications Start End    Pcp, No PCP - General   05/29/19             Referring providers can utilize [email or web address] to access their referred [place] patient's information.

## 2019-11-04 LAB — TEST FOR FETAL CELLS (CONFIRMATION) - LAB USE ONLY: FETAL SCREEN (ROSETTE TEST): NEGATIVE

## 2019-11-06 ENCOUNTER — Telehealth (HOSPITAL_COMMUNITY): Payer: Self-pay

## 2019-11-06 NOTE — Telephone Encounter (Signed)
Transition of Care Contact  Discharge Date: 11/03/2019  Transition Facility Type--Hospital (Inpatient or Observation)  Facility [facility]  Interactive Contact(s): Completed or attempted contact indicated by Date/Time  Completed Contact  First Attempt--11/06/2019 12:20 PM  Second Attempt--11/06/2019 12:20 PM  Third Attempt  Contact Method(s)-- Patient/Caregiver Telephone, MyChart Patient Portal  Clinical Staff Name/Role who contacted--Jawanna Dykman, RN   Transition Note:   Attempt #1--DNN left detailed voice message.   Attempt #2--My Chart message sent.   Dinah Beers, RN  11/06/2019, 12:20       Further information may be documented in relevant telephone or outreach encounter.  Dinah Beers, RN  11/06/2019, 12:20

## 2019-11-08 LAB — TYPE AND SCREEN
ABO/RH(D): A NEG
ANTIBODY SCREEN: POSITIVE
DAT, IGG SPECIFIC: NEGATIVE

## 2019-11-29 ENCOUNTER — Ambulatory Visit (HOSPITAL_COMMUNITY): Payer: Self-pay | Admitting: Family

## 2019-12-05 ENCOUNTER — Ambulatory Visit: Payer: 59 | Attending: Family | Admitting: Family

## 2019-12-05 ENCOUNTER — Other Ambulatory Visit: Payer: Self-pay

## 2019-12-05 NOTE — Progress Notes (Signed)
OB/GYN CLINIC, [facility]  [address]  [place] [address]  Operated by [facility]  Progress Note     Name: Lynnett Langlinais MRN:  [id number]   Date: 12/05/2019 Age: 29 y.o.          CC:  Postpartum Examination    S:  Patient is a 29 year old gravida 2 para 2002 status post SVD on 11/02/2019 at 37+ [redacted] weeks EGA.  Delivery productive of a viable female infant weighing 6 lb 9 oz. Patient's delivery and postpartum course uncomplicated.  Prenatal course notable for gestational hypertension.  Patient presents today for a 6 week postpartum examination. Presently, patient is without complaints. Patient reports that she is breast feeding. States infant is doing well and reports family is well adjusted. Denies vaginal bleeding, pelvic pain, vaginal discharge or signs/symptoms of postpartum depression.    Review of Systems:    Constitutional: Denies fevers, chills, weight gain, weight loss, malaise, fatigue, night sweats or decreased appetite  Skin: Denies rash, pruritus or skin lesions  HEENT: Denies headaches, hearing loss, tinnitus, nosebleed, sore throat or sinus pain  Breasts: Denies breast lumps, nipple discharge or breast pain  Eyes: Denies visual changes  Cardiovascular: Denies chest pain, shortness of breath, palpitations, orthopnea, claudication or leg swelling  Respiratory: Denies apnea, shortness of breath, wheezing, snoring, coughing, hemoptysis or sputum production  Allergies/Immune: Denies any seasonal allergies   Gastrointestinal: Denies heartburn, dysphasia, nausea, abdominal pain, diarrhea, constipation or blood in stool  Genitourinary: Denies incontinence, dysuria, urgency, frequency, hematuria or vaginal discharge  Musculoskeletal: Denies myalgias, back pain, joint pain or falls  Endo/Heme:  Denies heat or cold intolerance, polydipsia, polyphagia, easy bruising or bleeding  Neurological: Denies seizures, dizziness, lightheadedness, changes in speech,  focal weakness, tremor, sensory change, numbness or tingling  Psychiatric: Denies depression, anxiety, insomnia or memory loss    O:  Vitals:  BP 115/80   Pulse 86   Temp 36.7 C (98 F)   Ht 1.702 m (5\' 7" )   Wt 101 kg (222 lb)   LMP 01/14/2019 (Exact Date)   SpO2 98%   BMI 34.77 kg/m   General appearance:  Well developed, well nourished white female in no apparent distress; awake/alert and oriented x 3; well-groomed  HEENT:  Within normal limits  Neck:  No lymphadenopathy; no thyromegaly  Lungs:  Clear to auscultation bilaterally; no wheezes, rales or rhonchi  Heart: Regular rate and rhythm; no murmurs, rubs or gallops  Abdomen:  Soft, non-distended and non-tender; incision well healed with no erythema, warmth, discharge or separation  Extremities:  No cyanosis, clubbing or edema  Neurologic:  Cranial nerves 2-12 intact  Skin:  Grossly within normal limits without any suspicious lesions  Breast examination:  Within normal limits bilaterally; no masses; no discharge; no skin changes; no axillary lymphadenopathy  Pelvic examination:  External genitalia: Within normal limits; no lesions; no skin changes  Vulva/vagina: Within normal limits; no lesions; no discharge  Cervix: Within normal limits; no lesions; no discharge; no cervical motion tenderness  Uterus: Normal size, shape and consistency; nontender and mobile  Adnexa: Within normal limits; nontender and without masses      A/P:  29 year old gravida 2 para 2002 status post SVD who presents for 6 week postpartum examination currently, doing well with no signs/symptoms of postpartum depression  Most recent Pap smear 04/2019, and NILM.  Patient denies history of abnormal Pap smears.  Reviewed recommendation of Pap smears Q 3 years without history of  abnormal.  No questions at this time.  Will plan Pap smear 04/2022.  Breast self-examination reviewed.  Contraceptive counseling done. All available options and risks, benefits and side effects of each reviewed.   Patient is considering options at this time.  Pain/Bleeding precautions  Follow up in 12 months for annual examination.  Follow up sooner p.r.n.      S. Lace Kasserman-Marshall APRN, FNP-BC

## 2020-01-07 ENCOUNTER — Encounter (HOSPITAL_COMMUNITY): Payer: Self-pay | Admitting: Student in an Organized Health Care Education/Training Program

## 2020-01-08 ENCOUNTER — Other Ambulatory Visit (HOSPITAL_COMMUNITY): Payer: Self-pay | Admitting: Student in an Organized Health Care Education/Training Program

## 2020-01-08 MED ORDER — HYDROCORTISONE-PRAMOXINE 2.5 %-1 % RECTAL CREAM
TOPICAL_CREAM | Freq: Three times a day (TID) | RECTAL | 0 refills | Status: AC
Start: 2020-01-08 — End: 2020-01-15

## 2020-01-10 ENCOUNTER — Other Ambulatory Visit: Payer: Self-pay

## 2020-01-10 ENCOUNTER — Ambulatory Visit: Payer: 59 | Attending: INTERNAL MEDICINE-ENDOCRINOLOGY-DIABETES AND METABOLISM

## 2020-01-10 DIAGNOSIS — E039 Hypothyroidism, unspecified: Secondary | ICD-10-CM | POA: Insufficient documentation

## 2020-01-10 LAB — THYROID STIMULATING HORMONE (SENSITIVE TSH): TSH: 0.166 u[IU]/mL — ABNORMAL LOW (ref 0.430–3.550)

## 2020-01-30 ENCOUNTER — Encounter (HOSPITAL_BASED_OUTPATIENT_CLINIC_OR_DEPARTMENT_OTHER): Payer: Self-pay

## 2020-01-30 ENCOUNTER — Ambulatory Visit: Payer: 59 | Admitting: INTERNAL MEDICINE-ENDOCRINOLOGY-DIABETES AND METABOLISM

## 2020-01-30 DIAGNOSIS — E039 Hypothyroidism, unspecified: Secondary | ICD-10-CM

## 2020-01-30 NOTE — Progress Notes (Signed)
[place] [facility]  Virtual visit      Name: [name]  MRN: [id number]  DOB: [birth year]    Date of Service:  01/30/2020    Last visit in January 2021.    TELEMEDICINE DOCUMENTATION:  Patient Location:  MyChart video visit from home address: [address]  [place] [address]   Patient/family aware of provider location:  yes  Patient/family consent for telemedicine:  yes  Examination observed and performed by:  Ardelle Park, MD    Problem List:     1.  Post radioactive iodine ablation hypothyroidism. (December 2018)  2.  Post pregnancy in June 2021.        Breast feeding, and not using hormonal contraception.     Last Plan:     -Continue levothyroxine 112 mcg 6 days a week and half a pill on day 7.   - Following TSI at 28 weeks, because of her previous history of Graves disease. TSI antibodies can cross placenta and can cause fetal goiter. Will require frequent monitoring of fetal heart rate and monitoring.   -Last TSH on 06/25/18 was 1.14.   -Repeat TSH during 3rd trimester and 6 weeks post delivery.  -Repeat TSI in 2 months.    Review:     Delivered in June 2021. Vaginal delivery.  She weighed 6 lbs 7 oz and was 3 weeks early. Induced for gestational hypertension.   Currently breastfeeding. No complications.  No hormonal contraception. Menstrual cycles have resumed.  15 lb weight loss since delivery.   Before delivery she was taking levothyroxine 112  mcg 6 days and 1.5 pills on Sundays. (120 mcg)  Still currently taking the same dose.   Denies complications with daughters thyroid. Antibodies never crossed placenta.     Labs from 01/10/20 reviewed:     TSH: 0.166      History of Present Illness:      [name] is a 29 y.o. female who presents today for follow up of post radioactive iodine ablation hypothyroidism. Patient is currently [redacted] weeks pregnant with expected due date of June 16th. Reports that recent pregnancy scan was good. She will have weekly monitoring starting on week 32 and  at least 3 more scans. She is still taking 112 mcg of levothyroxine 1 pill x6 days and 1.5 pills on Sunday. Taking medication in the morning on an empty stomach. Taking prenatals at night. Patient is planning to breastfeed. She is not planning to start contraceptive immediately after delivery. Reports that she's feeling okay with occasional morning sickness.     Patient Active Problem List    Diagnosis    Gestational hypertension    Elevated BP without diagnosis of hypertension    Prenatal care    History of Graves' disease   Hypothyroidism    Obesity   PCOS     Past Medical History:   Diagnosis Date    Gestational hypertension 11/01/2019    Graves disease     PCOS (polycystic ovarian syndrome)     Seasonal allergic reaction     occasional OTC meds         Past Surgical History:   Procedure Laterality Date    HX WISDOM TEETH EXTRACTION  2010         Current Outpatient Medications   Medication Sig    levothyroxine (SYNTHROID) 112 mcg Oral Tablet Take one pill daily Monday through Saturday and 1/2 pill on Sunday.    PNV Combo #19-Iron-Fol Ac-DHA 22-6-1-200 mg  Oral Capsule Take 1 Cap by mouth Once a day     No Known Allergies  Family Medical History:       Problem Relation (Age of Onset)    Eclampsia Sister    Hypertension (High Blood Pressure) Mother    Spont Ab Mother, Sister, Sister              Social History     Tobacco Use    Smoking status: Never Smoker    Smokeless tobacco: Never Used   Substance Use Topics    Alcohol use: No     Comment: occasionally once per week       Review of Systems:  Constitutional: negative   Denies tachycardia, tremors, weight loss.   Denies heat or cold intolerance.   Reports hair thinning and hair loss in the last 6 months.   Denies diarrhea.     Physical Exam:    Physical exam not because this was a virtual visit.     Assessment/Plan:    1. Post RAI ablation hypothyroidism.     Delivered healthy baby in June 2021. Vaginal delivery.   Daughter weighed 6 lbs 7 oz and was 3 weeks early.  Induced for gestational hypertension.   Currently breastfeeding. No complications. Not taking hormonal contraception.  15 lb weight loss since delivery.  Last TSH was 0.166 on 01/10/20.   Current dose is levothyroxine 112 mcg x 6 days and 1.5 pills on day 7.  Reducing levothyroxine 112 mcg x 6 days and 1/2 pill on day 7 until refill is needed.   Repeat TSH in 6 weeks.   Patient advised to inform me if she starts hormonal contraception.    Total time spent 15 minutes.     Return in 12 months.       I am scribing for, and in the presence of, Dr. Ronny Flurry for services provided on 01/30/2020.  Ambrose Pancoast, SCRIBE   [place], SCRIBE  01/30/2020, 12:59      I personally performed the services described in this documentation, as scribed  in my presence, and it is both accurate  and complete.    Ardelle Park, MD   Assistant Professor    Endocrinology and [facility] Medicine  [phone]

## 2020-02-27 ENCOUNTER — Encounter (HOSPITAL_BASED_OUTPATIENT_CLINIC_OR_DEPARTMENT_OTHER): Payer: Self-pay | Admitting: INTERNAL MEDICINE-ENDOCRINOLOGY-DIABETES AND METABOLISM

## 2020-04-02 ENCOUNTER — Other Ambulatory Visit (FREE_STANDING_LABORATORY_FACILITY): Payer: Self-pay | Admitting: Anatomic Pathology & Clinical Pathology

## 2020-04-02 ENCOUNTER — Encounter (FREE_STANDING_LABORATORY_FACILITY): Admit: 2020-04-02 | Discharge: 2020-04-02 | Disposition: A | Discharge status: Home / Self Care | Payer: Self-pay

## 2020-04-02 LAB — THYROID STIMULATING HORMONE (SENSITIVE TSH): TSH: 0.168 u[IU]/mL — ABNORMAL LOW (ref 0.430–3.550)

## 2020-04-16 ENCOUNTER — Encounter (HOSPITAL_COMMUNITY): Payer: Self-pay | Admitting: Student in an Organized Health Care Education/Training Program

## 2020-04-17 ENCOUNTER — Other Ambulatory Visit (HOSPITAL_COMMUNITY): Payer: Self-pay | Admitting: Student in an Organized Health Care Education/Training Program

## 2020-04-17 MED ORDER — HYDROCORTISONE-PRAMOXINE 2.5 %-1 % RECTAL CREAM
TOPICAL_CREAM | Freq: Three times a day (TID) | RECTAL | 2 refills | Status: AC
Start: 2020-04-17 — End: 2020-04-24

## 2020-04-25 ENCOUNTER — Other Ambulatory Visit (HOSPITAL_COMMUNITY): Payer: Self-pay | Admitting: Student in an Organized Health Care Education/Training Program

## 2020-06-19 ENCOUNTER — Other Ambulatory Visit (FREE_STANDING_LABORATORY_FACILITY): Payer: Self-pay | Admitting: Anatomic Pathology & Clinical Pathology

## 2020-06-19 ENCOUNTER — Encounter (FREE_STANDING_LABORATORY_FACILITY): Admit: 2020-06-19 | Discharge: 2020-06-19 | Disposition: A | Discharge status: Home / Self Care | Payer: Self-pay

## 2020-06-19 LAB — THYROID STIMULATING HORMONE (SENSITIVE TSH): TSH: 0.263 u[IU]/mL — ABNORMAL LOW (ref 0.430–3.550)

## 2020-08-12 ENCOUNTER — Other Ambulatory Visit (HOSPITAL_BASED_OUTPATIENT_CLINIC_OR_DEPARTMENT_OTHER): Payer: Self-pay | Admitting: INTERNAL MEDICINE-ENDOCRINOLOGY-DIABETES AND METABOLISM

## 2020-08-12 MED ORDER — LEVOTHYROXINE 112 MCG TABLET
ORAL_TABLET | ORAL | 1 refills | Status: DC
Start: 2020-08-12 — End: 2021-01-29

## 2020-08-12 NOTE — Telephone Encounter (Signed)
Current Outpatient Medications   Medication Sig   . levothyroxine (SYNTHROID) 112 mcg Oral Tablet Take one pill daily Monday through Saturday and 1/2 pill on Sunday.   Marland Kitchen PNV Combo #19-Iron-Fol Ac-DHA 22-6-1-200 mg Oral Capsule Take 1 Cap by mouth Once a day     Last dept visit:  09/07/2018  Next pending dept visit: 01/27/2021  levothyroxine  Christie Nottingham, RN 08/12/2020, 15:15

## 2020-08-12 NOTE — Telephone Encounter (Signed)
Regarding: out of meds refill request  ----- Message from Lona Millard sent at 08/12/2020  3:13 PM EST -----  Doctor Name:  Ardelle Park, MD       Date of last appointment: 01/29/21    Next scheduled visit: 01/27/21    Medication Requested:   levothyroxine (SYNTHROID) 112 mcg Oral Tablet    Preferred Pharmacy     [id number] - [place], [place] - [address] AT   [facility] [address]    [address] [facility] [address]    Phone: [phone] Fax: [phone]    Hours: Not open 24 hours          Notes for Nurse or Physician: out of meds

## 2020-08-22 ENCOUNTER — Other Ambulatory Visit (FREE_STANDING_LABORATORY_FACILITY): Payer: Self-pay | Admitting: Anatomic Pathology & Clinical Pathology

## 2020-08-22 ENCOUNTER — Encounter (FREE_STANDING_LABORATORY_FACILITY): Admit: 2020-08-22 | Discharge: 2020-08-22 | Disposition: A | Discharge status: Home / Self Care | Payer: Self-pay

## 2020-08-22 ENCOUNTER — Encounter (HOSPITAL_BASED_OUTPATIENT_CLINIC_OR_DEPARTMENT_OTHER): Payer: Self-pay | Admitting: INTERNAL MEDICINE-ENDOCRINOLOGY-DIABETES AND METABOLISM

## 2020-08-22 LAB — THYROID STIMULATING HORMONE (SENSITIVE TSH): TSH: 0.933 u[IU]/mL (ref 0.430–3.550)

## 2020-11-05 ENCOUNTER — Encounter (FREE_STANDING_LABORATORY_FACILITY): Admit: 2020-11-05 | Discharge: 2020-11-05 | Disposition: A | Discharge status: Home / Self Care | Payer: Self-pay

## 2020-11-05 ENCOUNTER — Encounter (HOSPITAL_BASED_OUTPATIENT_CLINIC_OR_DEPARTMENT_OTHER): Payer: Self-pay | Admitting: INTERNAL MEDICINE-ENDOCRINOLOGY-DIABETES AND METABOLISM

## 2020-11-05 ENCOUNTER — Other Ambulatory Visit (FREE_STANDING_LABORATORY_FACILITY): Payer: Self-pay | Admitting: Anatomic Pathology & Clinical Pathology

## 2020-11-05 LAB — HCG, PLASMA OR SERUM QUANTITATIVE, PREGNANCY: HCG QUANTITATIVE PREGNANCY: 49 m[IU]/mL — ABNORMAL HIGH (ref <5)

## 2020-11-05 LAB — THYROID STIMULATING HORMONE (SENSITIVE TSH): TSH: 1.437 u[IU]/mL (ref 0.430–3.550)

## 2020-11-11 ENCOUNTER — Other Ambulatory Visit (FREE_STANDING_LABORATORY_FACILITY): Payer: Self-pay | Admitting: Anatomic Pathology & Clinical Pathology

## 2020-11-11 ENCOUNTER — Encounter (FREE_STANDING_LABORATORY_FACILITY): Admit: 2020-11-11 | Discharge: 2020-11-11 | Disposition: A | Discharge status: Home / Self Care | Payer: Self-pay

## 2020-11-11 LAB — HCG, PLASMA OR SERUM QUANTITATIVE, PREGNANCY: HCG QUANTITATIVE PREGNANCY: 947 m[IU]/mL — ABNORMAL HIGH (ref <5)

## 2020-11-14 ENCOUNTER — Other Ambulatory Visit: Payer: Self-pay

## 2020-11-14 ENCOUNTER — Ambulatory Visit: Payer: BC Managed Care – PPO | Attending: Advanced Practice Midwife | Admitting: Advanced Practice Midwife

## 2020-11-14 ENCOUNTER — Ambulatory Visit (HOSPITAL_BASED_OUTPATIENT_CLINIC_OR_DEPARTMENT_OTHER): Payer: BC Managed Care – PPO | Admitting: Gynecology

## 2020-11-14 ENCOUNTER — Encounter (HOSPITAL_BASED_OUTPATIENT_CLINIC_OR_DEPARTMENT_OTHER): Payer: Self-pay | Admitting: Advanced Practice Midwife

## 2020-11-14 VITALS — BP 116/86 | Ht 67.0 in | Wt 235.7 lb

## 2020-11-14 DIAGNOSIS — Z3490 Encounter for supervision of normal pregnancy, unspecified, unspecified trimester: Secondary | ICD-10-CM

## 2020-11-14 DIAGNOSIS — Z8639 Personal history of other endocrine, nutritional and metabolic disease: Secondary | ICD-10-CM | POA: Insufficient documentation

## 2020-11-14 DIAGNOSIS — Z3A01 Less than 8 weeks gestation of pregnancy: Secondary | ICD-10-CM

## 2020-11-14 DIAGNOSIS — O161 Unspecified maternal hypertension, first trimester: Secondary | ICD-10-CM

## 2020-11-14 LAB — HIV1/HIV2 SCREEN, COMBINED ANTIGEN AND ANTIBODY: HIV SCREEN, COMBINED ANTIGEN & ANTIBODY: NEGATIVE

## 2020-11-14 LAB — CBC
HCT: 40.9 % (ref 34.8–46.0)
HGB: 14.3 g/dL (ref 11.5–16.0)
MCH: 29.8 pg (ref 26.0–32.0)
MCHC: 35 g/dL (ref 31.0–35.5)
MCV: 85.2 fL (ref 78.0–100.0)
MPV: 10.9 fL (ref 8.7–12.5)
PLATELETS: 282 10*3/uL (ref 150–400)
RBC: 4.8 10*6/uL (ref 3.85–5.22)
RDW-CV: 12.6 % (ref 11.5–15.5)
WBC: 7.6 10*3/uL (ref 3.7–11.0)

## 2020-11-14 LAB — HEPATITIS B SURFACE ANTIGEN: HBV SURFACE ANTIGEN QUALITATIVE: NEGATIVE

## 2020-11-14 LAB — ABO/RH AND ANTIBODY SCREEN
ABO/RH(D): A NEG
ANTIBODY SCREEN: NEGATIVE

## 2020-11-14 LAB — HEPATITIS C ANTIBODY SCREEN WITH REFLEX TO HCV PCR: HCV ANTIBODY QUALITATIVE: NEGATIVE

## 2020-11-14 LAB — SYPHILIS SCREENING ALGORITHM WITH REFLEX, SERUM: SYPHILIS TP ANTIBODIES: NONREACTIVE

## 2020-11-14 NOTE — Progress Notes (Addendum)
Department of Obstetrics & Gynecology    New Obstetrics Visit    Date: 11/14/2020    Patient seen by Tona Sensing CNM    Subjective:       [name] is a 30 y.o. female who presents today for initial prenatal visit. She is here for New OB visit.  This is a planned pregnancy dated as follows:    Dating Summary      Working EDD: 07/11/21 set by Thurnell Lose, Ambulatory Care Assistant on 11/14/20 based on Last Menstrual Period on 10/04/20          Based On EDD GA Diff GA User Date    Last Menstrual Period on 10/04/20   07/11/21 Working  Thurnell Lose, Ambulatory Care Assistant 11/14/20           She is a G3 P2002  Feeling well, worried about ?nausea and vomiting per history of.  Questions regarding Graves Disease and pregnancy -- discussed.  Had MFM consult with last pregnancy, recs in chart/problem list.  She is currently nursing her one year old daughter    NOB History taken as charted below:  Past Medical History:   Diagnosis Date   . Gestational hypertension 11/01/2019   . Graves disease    . PCOS (polycystic ovarian syndrome)    . Seasonal allergic reaction     occasional OTC meds          Past Surgical History:   Procedure Laterality Date   . HX WISDOM TEETH EXTRACTION  2010          Family Medical History:     Problem Relation (Age of Onset)    Dementia Mother (3)    Eclampsia Sister    Hypertension (High Blood Pressure) Mother    No Known Problems Father    Spont Ab Mother, Sister, Sister             Genetics:  Denies history of infants passing away shortly after birth or fetal losses. Denies known presence of congenital heart defects, Thalassemia, Sickle Cell, Canavan's, 's, mental retardation, autism, Turner's, Marfan's, Cystic Fibrosis, Down Syndrome, other trisomy or genetic anomalies, or hemophilias among close relatives.  Denies close female family members with preeclampsia, GHTN, GDM.   Female family members have had successful vaginal deliveries as has patient  herself.     Other than ROS in the HPI, all other systems were negative.     Prenatal physical done as charted below:   OB Prenatal Exam: Last filed by Tona Sensing, CNM on 11/14/2020 11:44 AM     General Physical Exam     HEENT: normal  Heart: normal  Skin: normal     Thyroid: normal  Extremities: normal +2/+2 DTRs, no clonus Lymph Nodes: normal     Breasts: normal Breastfeeding her one year old Neurological: normal  Abdomen: normal Neg CVAT               Pelvic Exam     Vulva: normal  Vagina: normal     Cervix: normal  Adnexa: normal     Rectum: normal  Spines: average     Sacrum: concave  Subpubic Arch: normal     Pelvic Type: gynecoid Proven to 6# 7 oz                                 FHTs wee neither attempted nor heard with Doppler per early  gestation.    Assessment:      presumed IUP at 5 weeks 6 days       S=D      Prepregnant BMI = 36.59, advise 20-25# healthy weight gain      Desires care with Midwifery Service.     Declines genetic testing/  First trimester screen.      Graves disease, s/p MFM consult in last pregnancy (recs in problem list) Synthroid adjusted last week by Endocrinology.    Plan:    NOB teaching done.      OB Warning s/s taught and how to call reviewed.     Prenatal labs, Pap, and cultures today.     Midwifery Service overview given with NOB booklet and handouts.     MD collaboration/referral options discussed.     PRSI form done     RTO 4 weeks ROB, sooner prn, repeat TFTs at that time.    Tona Sensing, CNM

## 2020-11-15 LAB — CHLAMYDIA TRACHOMATIS/NEISSERIA GONORRHOEAE RNA, NAAT
CHLAMYDIA TRACHOMATIS RNA: NEGATIVE
NEISSERIA GONORRHEA GC RNA: NEGATIVE

## 2020-11-15 LAB — RUBELLA VIRUS ANTIBODIES, IGG, SERUM: RUBELLA IGG QUALITATIVE: POSITIVE

## 2020-11-15 LAB — URINE CULTURE: URINE CULTURE: 22000

## 2020-11-20 LAB — CYTOPATHOLOGY, GYN +/- HIGH RISK HPV

## 2020-11-21 ENCOUNTER — Encounter (HOSPITAL_COMMUNITY): Payer: Self-pay | Admitting: Advanced Practice Midwife

## 2020-11-21 DIAGNOSIS — Z3480 Encounter for supervision of other normal pregnancy, unspecified trimester: Secondary | ICD-10-CM | POA: Insufficient documentation

## 2020-11-22 LAB — HUMAN PAPILLOMA VIRUS (HPV) BY PCR WITH HIGH RISK GENOTYPING (THINPREP)
HPV OTHER: NEGATIVE
HPV16 PCR: NEGATIVE
HPV18 PCR: NEGATIVE

## 2020-11-27 ENCOUNTER — Encounter (HOSPITAL_BASED_OUTPATIENT_CLINIC_OR_DEPARTMENT_OTHER): Payer: Self-pay | Admitting: Advanced Practice Midwife

## 2020-12-03 ENCOUNTER — Encounter (HOSPITAL_BASED_OUTPATIENT_CLINIC_OR_DEPARTMENT_OTHER): Payer: Self-pay | Admitting: Advanced Practice Midwife

## 2020-12-06 ENCOUNTER — Ambulatory Visit (HOSPITAL_COMMUNITY): Payer: Self-pay | Admitting: Student in an Organized Health Care Education/Training Program

## 2020-12-17 ENCOUNTER — Ambulatory Visit (HOSPITAL_BASED_OUTPATIENT_CLINIC_OR_DEPARTMENT_OTHER): Payer: BLUE CROSS/BLUE SHIELD | Admitting: Gynecology

## 2020-12-17 ENCOUNTER — Ambulatory Visit: Payer: BLUE CROSS/BLUE SHIELD | Attending: Advanced Practice Midwife | Admitting: Advanced Practice Midwife

## 2020-12-17 ENCOUNTER — Encounter (HOSPITAL_BASED_OUTPATIENT_CLINIC_OR_DEPARTMENT_OTHER): Payer: Self-pay | Admitting: INTERNAL MEDICINE-ENDOCRINOLOGY-DIABETES AND METABOLISM

## 2020-12-17 ENCOUNTER — Encounter (HOSPITAL_BASED_OUTPATIENT_CLINIC_OR_DEPARTMENT_OTHER): Payer: Self-pay | Admitting: Advanced Practice Midwife

## 2020-12-17 ENCOUNTER — Other Ambulatory Visit: Payer: Self-pay

## 2020-12-17 VITALS — BP 120/76 | Ht 67.0 in | Wt 235.5 lb

## 2020-12-17 DIAGNOSIS — Z8639 Personal history of other endocrine, nutritional and metabolic disease: Secondary | ICD-10-CM

## 2020-12-17 DIAGNOSIS — O26899 Other specified pregnancy related conditions, unspecified trimester: Secondary | ICD-10-CM | POA: Insufficient documentation

## 2020-12-17 DIAGNOSIS — Z3A10 10 weeks gestation of pregnancy: Secondary | ICD-10-CM

## 2020-12-17 DIAGNOSIS — Z6791 Unspecified blood type, Rh negative: Secondary | ICD-10-CM | POA: Insufficient documentation

## 2020-12-17 DIAGNOSIS — O360910 Maternal care for other rhesus isoimmunization, first trimester, not applicable or unspecified: Secondary | ICD-10-CM

## 2020-12-17 DIAGNOSIS — O2241 Hemorrhoids in pregnancy, first trimester: Secondary | ICD-10-CM

## 2020-12-17 LAB — THYROXINE, FREE (FREE T4): THYROXINE (T4), FREE: 1.06 ng/dL (ref 0.70–1.25)

## 2020-12-17 LAB — THYROID STIMULATING HORMONE (SENSITIVE TSH): TSH: 2.542 u[IU]/mL (ref 0.430–3.550)

## 2020-12-17 NOTE — Progress Notes (Signed)
Patient seen by Tona Sensing CNM  Here for ROB  Doing well  NOB labs reviewed, and all WNL  No nausea or vomiting  has PNVs  Questions about hemorrhoids -- has had some rectal bleeding -- advised OTC meds    Weight: 107 kg (235 lb 7.2 oz)  BP (Non-Invasive): 120/76  Fundal height: cwd  Preterm Labor: None  Fetal Movement: Absent  FHR (1): + on bedside u/s    A:  IUP at [redacted]w[redacted]d        S=D       declines genetic screening       Due for TFTs -- will order    P:  TFTs today, adjust Rx prn       OB warning signs including general s/s miscarriage, vaginal bleeding, abdominal cramping, timing of fetal movement, and unusual vaginal discharge discussed and reviewed.        RTO 4 weeks, sooner prn, for ROB       Patient already counseled and educated about COVID 19, reviewed recs for pregnancy and L and D       COVID vaccination x2    Tona Sensing, CNM

## 2020-12-18 ENCOUNTER — Encounter (HOSPITAL_BASED_OUTPATIENT_CLINIC_OR_DEPARTMENT_OTHER): Payer: Self-pay | Admitting: Advanced Practice Midwife

## 2020-12-18 ENCOUNTER — Other Ambulatory Visit (INDEPENDENT_AMBULATORY_CARE_PROVIDER_SITE_OTHER): Payer: Self-pay | Admitting: Advanced Practice Midwife

## 2020-12-18 DIAGNOSIS — Z8639 Personal history of other endocrine, nutritional and metabolic disease: Secondary | ICD-10-CM

## 2020-12-24 ENCOUNTER — Encounter (INDEPENDENT_AMBULATORY_CARE_PROVIDER_SITE_OTHER): Payer: Self-pay

## 2020-12-24 DIAGNOSIS — O0990 Supervision of high risk pregnancy, unspecified, unspecified trimester: Secondary | ICD-10-CM

## 2020-12-24 NOTE — Nursing Note (Signed)
Request for High Risk OB consult from: Tona Sensing, APRN     Reason for request: Graves Disease, s/p Thyroid Ablation     Age: 30     Gravida/Para: G3P2     Best EDD: 07/11/21    Current gestational age on: 12/24/20- [redacted]w[redacted]d    Chart information:   [name] was referred by Tona Sensing, APRN due to [facility] Disease, s/p Thyroid Ablation.      Grave's Disease, s/p Thyroid Ablation:    Seen by MFM in previous pregnancy, per notes 06/2019- Diagnosed with Grave's diease in 2018- never treated with PTU or methimazole but did undergo radioablation of thyroid. Since that time has been on Synthroid for hypothyroidism, continues to have TSI antibodies. Follows with Dr. Ronny Flurry.    Per last visit with Dr. Ronny Flurry 01/2020- Last TSH 01/10/20- 0.166. Current dose Synthroid- 112 mcg x6 days and 1.5 pills on day 7. Return in 12 months.    Most recent TSH 12/17/20- 2.542, Free T4- 1.06. Increased Synthroid to 112 mcg x5 days and 2 pills on Sat & Sun      PMH:   Hx SVD x2:    G1, 2015- Term female   G2, 2021- Term female; GHTN     PCOS     Labs, imaging in Epic     Schedule:   O MFM consult  O Genetic consult  O Fetal ultrasound      O   singleton    O  Twins   O Triplets   O  _______  O < 14 week   O Nuchal translucency  O Limited  O Anatomy   O Growth (follow up)  O UA Doppler  O MCA Doppler  O Fetal echocardiogram  O Maternal echocardiogram  O Diabetes Education  O Other _______________________________

## 2021-01-14 ENCOUNTER — Institutional Professional Consult (permissible substitution) (INDEPENDENT_AMBULATORY_CARE_PROVIDER_SITE_OTHER): Payer: Self-pay | Admitting: Obstetrics & Gynecology

## 2021-01-14 ENCOUNTER — Other Ambulatory Visit (INDEPENDENT_AMBULATORY_CARE_PROVIDER_SITE_OTHER): Payer: Self-pay

## 2021-01-15 ENCOUNTER — Other Ambulatory Visit: Payer: Self-pay

## 2021-01-15 ENCOUNTER — Other Ambulatory Visit (INDEPENDENT_AMBULATORY_CARE_PROVIDER_SITE_OTHER): Payer: Self-pay | Admitting: Obstetrics & Gynecology

## 2021-01-15 ENCOUNTER — Ambulatory Visit: Payer: BLUE CROSS/BLUE SHIELD | Attending: Obstetrics & Gynecology

## 2021-01-15 ENCOUNTER — Institutional Professional Consult (permissible substitution) (INDEPENDENT_AMBULATORY_CARE_PROVIDER_SITE_OTHER): Payer: Self-pay | Admitting: Obstetrics & Gynecology

## 2021-01-15 ENCOUNTER — Ambulatory Visit: Payer: BLUE CROSS/BLUE SHIELD | Admitting: Obstetrics & Gynecology

## 2021-01-15 ENCOUNTER — Other Ambulatory Visit (INDEPENDENT_AMBULATORY_CARE_PROVIDER_SITE_OTHER): Payer: Self-pay

## 2021-01-15 DIAGNOSIS — O0990 Supervision of high risk pregnancy, unspecified, unspecified trimester: Secondary | ICD-10-CM

## 2021-01-15 DIAGNOSIS — O0992 Supervision of high risk pregnancy, unspecified, second trimester: Secondary | ICD-10-CM

## 2021-01-15 DIAGNOSIS — Z3A14 14 weeks gestation of pregnancy: Secondary | ICD-10-CM

## 2021-01-15 DIAGNOSIS — Z8639 Personal history of other endocrine, nutritional and metabolic disease: Secondary | ICD-10-CM

## 2021-01-15 DIAGNOSIS — O99281 Endocrine, nutritional and metabolic diseases complicating pregnancy, first trimester: Secondary | ICD-10-CM | POA: Insufficient documentation

## 2021-01-15 DIAGNOSIS — E0500 Thyrotoxicosis with diffuse goiter without thyrotoxic crisis or storm: Secondary | ICD-10-CM | POA: Insufficient documentation

## 2021-01-15 DIAGNOSIS — E282 Polycystic ovarian syndrome: Secondary | ICD-10-CM | POA: Insufficient documentation

## 2021-01-15 NOTE — Progress Notes (Signed)
[facility] of [facility] & Gynecology   MFM Consultation    TELEMEDICINE CONSULTATION    Patient Location: MyChart video visit from home address: [address]  [facility] [address]  Patient/family aware of provider location: yes  Patient/family consent for telemedicine: yes  Examination observed and performed by: Marcelino Scot, MD    GT Modifier -- this consult was performed via telemedicine.  Date of Service: 01/15/2021    I was ask to see this patient because she has Grave's Disease    Impressions   . Intrauterine pregnancy at 14 5/7  weeks' gestation  . Grave's Disease status post radioablation 2019  . Most current TSH high normal  . Patient without symptoms of thyrotoxicosis or hypothyroidism  . PCOS       Recommendations   . Patient working with endocrinologist to adjust replacement - continue  . Detailed ultrasound at 20 weeks -- ordered  . Ultrasound at 32 and 36 weeks to assess fetal growth and look for goiter  . If both normal - no other testing  . For the PCOS - early screen for gestational diabetes      History    OB History   Gravida Para Term Preterm AB Living   3 2 2     2    SAB IAB Ectopic Multiple Live Births         0 2      # Outcome Date GA Lbr Len/2nd Weight Sex Delivery Anes PTL Lv   3 Current            2 Term 11/02/19 [redacted]w[redacted]d / 00:07 2.977 kg (6 lb 9 oz) F Vag-Spont None N LIV   1 Term 04/20/14 [redacted]w[redacted]d 21:30 / 00:13 2.58 kg (5 lb 11 oz) F Vag-Spont Local N LIV      Birth Comments: Born at 40 weeks via vaginal delivery. AGA. No prenatal or post natal complications. Breast feeding 20 minutes at a time per feed. Patient tolerating feeds well with regular stooling and multiple wet diapers. Hepatitis B vaccine was administered. Patient passed Hearing screen and O2. Patient was discharged at 36 hours since patient was doing well and parents were instructed to bring child tomorrow to [facility] for newborn screen. Patient otherwise will follow-up with [facility].     Gyn History     LMP:  10/04/2020, Pregnant    Age at Menarche:     Age at First Pregnancy:     Age at Menopause:     Gyn History Comments:     Sexual Activity: Yes; Female    Contraception: None    Medical History     Diagnosis Date    Seasonal allergic reaction     PCOS (polycystic ovarian syndrome)     Gestational hypertension 11/01/2019    Graves disease           Surgical History     Procedure Date    HX WISDOM TEETH EXTRACTION 2010              Past Surgical History:   Procedure Laterality Date   . HX WISDOM TEETH EXTRACTION  2010     Past Medical History:   Diagnosis Date   . Gestational hypertension 11/01/2019   . Graves disease    . PCOS (polycystic ovarian syndrome)    . Seasonal allergic reaction     occasional OTC meds         No Known Allergies  levothyroxine (SYNTHROID) 112  mcg Oral Tablet, Take one pill daily Monday through Saturday and 1/2 pill on Sunday. (Patient taking differently: Take one pill daily Monday through Saturday and two pills on Sunday.)  PNV Combo #19-Iron-Fol Ac-DHA 22-6-1-200 mg Oral Capsule, Take 1 Cap by mouth Once a day    No facility-administered medications prior to visit.    Social History     Socioeconomic History   . Marital status: Married     Spouse name: [name]   . Number of children: 2   . Years of education: 18   Occupational History     Employer: [employer]     Comment: [employer]   . Occupation: homemaker   . Occupation: document processor   Tobacco Use   . Smoking status: Never Smoker   . Smokeless tobacco: Never Used   Vaping Use   . Vaping Use: Never used   Substance and Sexual Activity   . Alcohol use: Not Currently     Comment: occasionally once per week   . Drug use: No   . 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   Waynard Reeds, MD

## 2021-01-27 ENCOUNTER — Ambulatory Visit
Payer: BLUE CROSS/BLUE SHIELD | Attending: INTERNAL MEDICINE-ENDOCRINOLOGY-DIABETES AND METABOLISM | Admitting: INTERNAL MEDICINE-ENDOCRINOLOGY-DIABETES AND METABOLISM

## 2021-01-27 DIAGNOSIS — E039 Hypothyroidism, unspecified: Secondary | ICD-10-CM | POA: Insufficient documentation

## 2021-01-27 NOTE — Progress Notes (Signed)
[facility]  Virtual visit      Name: [name]  MRN: [id number]  DOB: [birth year]    Date of Service:  01/27/2021    Last visit in August 2021    TELEMEDICINE DOCUMENTATION:  Patient Location:  MyChart video visit from home address: [address]  [facility] [address]   Patient/family aware of provider location:  yes  Patient/family consent for telemedicine:  yes  Examination observed and performed by:  Ardelle Park, MD    Problem List:     1.  Post radioactive iodine ablation hypothyroidism. (December 2018)  2.  [redacted] weeks pregnant.         Due date 07/11/2021     Last Plan:     -Continue levothyroxine 112 mcg 5 days a week and 2 pills each on Sat and Sunday.     Review:     [redacted]  weeks pregnant, dose increased by 20 % in the 1st trimester.   Taking medication correctly. Taking prenatal vitamins in the evenings  Patient reported significant nausea in the 1st trimester of pregnancy was not throwing up pills      THYROID STIMULATING HORMONE  Lab Results   Component Value Date    TSH 2.542 12/17/2020         History of Present Illness:      [name] is a 30 y.o. female who presents today for follow up of post radioactive iodine ablation hypothyroidism. Patient is currently [redacted] weeks pregnant with expected due date of June 16th. Reports that recent pregnancy scan was good. She will have weekly monitoring starting on week 32 and at least 3 more scans. She is still taking 112 mcg of levothyroxine 1 pill x6 days and 1.5 pills on Sunday. Taking medication in the morning on an empty stomach. Taking prenatals at night. Patient is planning to breastfeed. She is not planning to start contraceptive immediately after delivery. Reports that she's feeling okay with occasional morning sickness.     Patient Active Problem List    Diagnosis   . Prenatal care, subsequent pregnancy   . History of Graves' disease  Hypothyroidism   . Obesity  PCOS   . Rh negative state in antepartum period     Past  Medical History:   Diagnosis Date   . Gestational hypertension 11/01/2019   . Graves disease    . PCOS (polycystic ovarian syndrome)    . Seasonal allergic reaction     occasional OTC meds         Past Surgical History:   Procedure Laterality Date   . HX WISDOM TEETH EXTRACTION  2010         Current Outpatient Medications   Medication Sig   . levothyroxine (SYNTHROID) 112 mcg Oral Tablet Take one pill daily Monday through Saturday and 1/2 pill on Sunday. (Patient taking differently: Take one pill daily Monday through Saturday and two pills on Sunday.)   . PNV Combo #19-Iron-Fol Ac-DHA 22-6-1-200 mg Oral Capsule Take 1 Cap by mouth Once a day     No Known Allergies  Family Medical History:     Problem Relation (Age of Onset)    Dementia Mother (57)    Eclampsia Sister    Hypertension (High Blood Pressure) Mother    No Known Problems Father    Spont Ab Mother, Sister, Sister            Social History     Tobacco Use   .  Smoking status: Never Smoker   . Smokeless tobacco: Never Used   Substance Use Topics   . Alcohol use: Not Currently     Comment: occasionally once per week       Review of Systems:  Constitutional: negative   Denies tachycardia, tremors, weight loss.   Denies heat or cold intolerance.   Reports hair thinning and hair loss in the last 6 months.   Denies diarrhea.     Physical Exam:    Physical exam not because this was a virtual visit.     Assessment/Plan:    1. Post RAI ablation hypothyroidism.     Delivered healthy baby in June 2021. Vaginal delivery.   Daughter weighed 6 lbs 7 oz and was 3 weeks early. Induced for gestational hypertension.   [redacted] weeks pregnant again and due date in 07/11/2020.   Dose increased during pregnancy to 112 mcg x 5 days and 2 pills each on Saturday and Sunday.   Trimester specific targets discussed.   2nd trimester target TSH will be 0.2- 2.0   Repeat TSH tomorrow and again in three months.    Total time spent 15 minutes.     Return in 12 months.       Ardelle Park, MD      Associate  Professor, Endocrinology.    [facility]  [phone]

## 2021-01-28 ENCOUNTER — Encounter (HOSPITAL_BASED_OUTPATIENT_CLINIC_OR_DEPARTMENT_OTHER): Payer: Self-pay | Admitting: Advanced Practice Midwife

## 2021-01-28 ENCOUNTER — Other Ambulatory Visit: Payer: Self-pay

## 2021-01-28 ENCOUNTER — Ambulatory Visit: Payer: BLUE CROSS/BLUE SHIELD | Attending: Advanced Practice Midwife | Admitting: Advanced Practice Midwife

## 2021-01-28 ENCOUNTER — Ambulatory Visit (HOSPITAL_BASED_OUTPATIENT_CLINIC_OR_DEPARTMENT_OTHER): Payer: BLUE CROSS/BLUE SHIELD | Admitting: Gynecology

## 2021-01-28 VITALS — BP 112/84 | Ht 67.0 in | Wt 235.2 lb

## 2021-01-28 DIAGNOSIS — R110 Nausea: Secondary | ICD-10-CM

## 2021-01-28 DIAGNOSIS — E669 Obesity, unspecified: Secondary | ICD-10-CM | POA: Insufficient documentation

## 2021-01-28 DIAGNOSIS — E039 Hypothyroidism, unspecified: Secondary | ICD-10-CM | POA: Insufficient documentation

## 2021-01-28 DIAGNOSIS — O26892 Other specified pregnancy related conditions, second trimester: Secondary | ICD-10-CM

## 2021-01-28 DIAGNOSIS — O99212 Obesity complicating pregnancy, second trimester: Secondary | ICD-10-CM

## 2021-01-28 DIAGNOSIS — Z3482 Encounter for supervision of other normal pregnancy, second trimester: Secondary | ICD-10-CM | POA: Insufficient documentation

## 2021-01-28 DIAGNOSIS — O99891 Other specified diseases and conditions complicating pregnancy: Secondary | ICD-10-CM

## 2021-01-28 DIAGNOSIS — Z6791 Unspecified blood type, Rh negative: Secondary | ICD-10-CM

## 2021-01-28 DIAGNOSIS — Z3A16 16 weeks gestation of pregnancy: Secondary | ICD-10-CM

## 2021-01-28 LAB — THYROID STIMULATING HORMONE (SENSITIVE TSH): TSH: 0.75 u[IU]/mL (ref 0.430–3.550)

## 2021-01-28 NOTE — Progress Notes (Signed)
Patient seen by Tona Sensing CNM  Patient here for ROB  Doing OK, still having morning sickness -- declines Rx  Worried about no weight gain -- discussed  Working on weaning toddler  No appetite -- discussed which foods might be palatable  Weight: 107 kg (235 lb 3.7 oz)  BP (Non-Invasive): 112/84  Fundal Height: 2/3 sp to umbi  Preterm Labor: None  Fetal Movement: Present  Edema: Negative  FHR (1): 140's    A:  IUP at [redacted]w[redacted]d       S=D       Elevated Body mass index is 36.84 kg/m.       declines second trimester screening    P:  Anatomy scan next visit       OB Warning s/s including SROM, vaginal bleeding, decreased fetal movement, and contractions reviewed.       Patient already counseled and educated about COVID 19, reviewed recs for pregnancy and L and D       COVID vaccination x 2       RTO 4 weeks ROB and early one hour GTT sooner prn (as well as u/s)    Tona Sensing, CNM

## 2021-01-29 ENCOUNTER — Other Ambulatory Visit (HOSPITAL_BASED_OUTPATIENT_CLINIC_OR_DEPARTMENT_OTHER): Payer: Self-pay | Admitting: INTERNAL MEDICINE-ENDOCRINOLOGY-DIABETES AND METABOLISM

## 2021-01-29 NOTE — Telephone Encounter (Signed)
Current Outpatient Medications   Medication Sig   . levothyroxine (SYNTHROID) 112 mcg Oral Tablet Take one pill daily Monday through Saturday and 1/2 pill on Sunday. (Patient taking differently: Take one pill daily Monday through Friday and two pills on Saturday and Sunday.)   . PNV Combo #19-Iron-Fol Ac-DHA 22-6-1-200 mg Oral Capsule Take 1 Cap by mouth Once a day     Last dept visit:  01/27/2021  Next pending dept visit: 09/29/2021  Levo   Fidela Salisbury, MA 01/29/2021, 15:12

## 2021-02-06 ENCOUNTER — Encounter (HOSPITAL_BASED_OUTPATIENT_CLINIC_OR_DEPARTMENT_OTHER): Payer: Self-pay | Admitting: Advanced Practice Midwife

## 2021-02-24 ENCOUNTER — Other Ambulatory Visit (HOSPITAL_BASED_OUTPATIENT_CLINIC_OR_DEPARTMENT_OTHER): Payer: Self-pay

## 2021-02-24 ENCOUNTER — Other Ambulatory Visit (HOSPITAL_BASED_OUTPATIENT_CLINIC_OR_DEPARTMENT_OTHER): Payer: Self-pay | Admitting: Advanced Practice Midwife

## 2021-02-24 ENCOUNTER — Ambulatory Visit: Payer: BLUE CROSS/BLUE SHIELD | Attending: Advanced Practice Midwife

## 2021-02-24 ENCOUNTER — Other Ambulatory Visit: Payer: Self-pay

## 2021-02-24 ENCOUNTER — Encounter (FREE_STANDING_LABORATORY_FACILITY): Admit: 2021-02-24 | Discharge: 2021-02-24 | Disposition: A | Discharge status: Home / Self Care | Payer: Self-pay

## 2021-02-24 ENCOUNTER — Other Ambulatory Visit (FREE_STANDING_LABORATORY_FACILITY): Payer: Self-pay | Admitting: Anatomic Pathology & Clinical Pathology

## 2021-02-24 DIAGNOSIS — Z3482 Encounter for supervision of other normal pregnancy, second trimester: Secondary | ICD-10-CM | POA: Insufficient documentation

## 2021-02-24 DIAGNOSIS — Z3A20 20 weeks gestation of pregnancy: Secondary | ICD-10-CM

## 2021-02-24 DIAGNOSIS — Z8639 Personal history of other endocrine, nutritional and metabolic disease: Secondary | ICD-10-CM | POA: Insufficient documentation

## 2021-02-24 LAB — THYROID STIMULATING HORMONE (SENSITIVE TSH): TSH: 0.465 u[IU]/mL (ref 0.430–3.550)

## 2021-02-25 ENCOUNTER — Encounter (HOSPITAL_BASED_OUTPATIENT_CLINIC_OR_DEPARTMENT_OTHER): Payer: Self-pay

## 2021-02-25 ENCOUNTER — Other Ambulatory Visit: Payer: Self-pay

## 2021-02-25 ENCOUNTER — Ambulatory Visit: Payer: BLUE CROSS/BLUE SHIELD

## 2021-02-25 DIAGNOSIS — O99212 Obesity complicating pregnancy, second trimester: Secondary | ICD-10-CM

## 2021-02-25 DIAGNOSIS — O360920 Maternal care for other rhesus isoimmunization, second trimester, not applicable or unspecified: Secondary | ICD-10-CM

## 2021-02-25 DIAGNOSIS — Z3482 Encounter for supervision of other normal pregnancy, second trimester: Secondary | ICD-10-CM

## 2021-02-25 DIAGNOSIS — Z3A20 20 weeks gestation of pregnancy: Secondary | ICD-10-CM

## 2021-02-25 DIAGNOSIS — E0500 Thyrotoxicosis with diffuse goiter without thyrotoxic crisis or storm: Secondary | ICD-10-CM

## 2021-02-25 DIAGNOSIS — O99282 Endocrine, nutritional and metabolic diseases complicating pregnancy, second trimester: Secondary | ICD-10-CM

## 2021-02-25 NOTE — Progress Notes (Signed)
[facility] Department of Obstetric & Gynecology      RETURN OBSTETRICAL ENCOUNTER    PATIENT: [name]  CHART NUMBER: [id number]  DATE OF SERVICE: 02/25/2021    Subjective:  30 y.o. G3P2002 at [redacted]w[redacted]d presenting for ROB visit.     Pt reports doing well today with no complaints/concerns. Denies contractions/cramping. No LOF or VB. + fetal movement. Denies CP, SOB, HA, vision changes, RUQ pain, N/V, changes in peripheral edema.     Dating Summary      Working EDD: 07/11/21 set by Thurnell Lose, Ambulatory Care Assistant on 11/14/20 based on Last Menstrual Period on 10/04/20          Based On EDD GA Diff GA User Date    Last Menstrual Period on 10/04/20   07/11/21 Working  Thurnell Lose, Ambulatory Care Assistant 11/14/20         Objective:  BP 102/74   Ht 1.676 m (5\' 6" )   Wt 107 kg (236 lb 12.4 oz)   LMP 10/04/2020   BMI 38.22 kg/m     General: NAD  Chest: RRR, even non labored breathing on RA  Abdomen: Soft, gravid, + BS, NTTP  Extremities: nO edema, no calf tenderness    Fundal height: At umbilicus  FHT: 140s  Weight: 107 kg (236 lb 12.4 oz)  BP (Non-Invasive): 102/74  A/P:  30 y.o. G3P2002 at [redacted]w[redacted]d here for routine OB care. Problem list updated as below:     Patient Active Problem List    Diagnosis Date Noted   . Prenatal care, subsequent pregnancy 11/21/2020     Desires CNM care if available -- aware of midwife rotation and MD backup    Psychosocial   Planned pregnancy(?):    Certain LMP(?)    Primary support person:    Work during pregnancy(?):    Baby's sex/name:     Routine labs   Blood type:  A neg   AB screen: neg   H and H at NOB/date: 14.3/40.9 on 11/14/20   Rubella immunity: immune     HIV: neg   Hep B: neg   Hep C: neg   RPR or VDRL: NR     Gonorrhea: neg   Chlamydia: neg     CF carrier status:      Pap smear: normal     Urine culture: 22,000 mixed flora     28 week labs    H&H:     1 ht GTT:     AB screen:    Rhogam (if indicated):     Group B strep culture:     Fetal well  being   First trimester screening:   CfDNA:   Quad screen:    AFP only:       Anatomy 01/14/21:    Immunizations   Pertussis:    Influenza:     Intrapartum/Postpartum plans   Pain relief:    Feeding: breast              Contraception:        . History of Graves' disease  Hypothyroidism 05/29/2019     S/p ablation in 2018  Follows with Dr. 2019 at [facility]  10/15 TSI: 3.9  12/7 TSH: 1.254  06/26/19: 1.14  09/03/19 TSH:  0.970  10/04/19: TSH: 0.878  Current Regimen: Synthroid 112 mcg x 6 days and 168 mcg x 1 day  S/p MFM consult   Plan for growth 10/06/19 at 28, 32, and 36 weeks.  Ultrasound 09/26/19 growth in the 73rd percentile   Ultrasound 10/25/2019 growth in the 62nd percentile   Continue weekly testing with NST and fluid check   Plan for delivery at 39-[redacted] weeks gestation    Will follow above plan for this pregnancy.  AT NOB  Synthroid 112 mcg 6 days, then 168 mcg x 2 for one day    12/17/20:  TSH = 2.542, T4 = 1.06 (continue current dose)     . Obesity  PCOS 05/29/2019     Body mass index is 32.8 kg/m.  Early 1 hour gtt: 144  06/12/2019: 3 hour--92, 153, 130, 99   Hemoglobin A1c: 4.9  09/03/2019:3 hour -- 102, 176, 159, 96.      THIS PREGNANCY:  Early 1 hour GTT:    Two u/s for growth -- 32 and 36 weeks     . Rh negative state in antepartum period 10/28/2013     Prenatal care, subsequent pregnancy  Feels well  Good fetal movement  Denies ctx, bleeding, LOF  Follow up US to complete anatomy ordered    RTC in 4 week(s)    [name], APRN, NP-C 02/25/2021, 14:45

## 2021-02-25 NOTE — Assessment & Plan Note (Addendum)
Feels well  Good fetal movement  Denies ctx, bleeding, LOF  Follow up US to complete anatomy ordered

## 2021-02-25 NOTE — Patient Instructions (Signed)
It was wonderful to see you in the clinic today for your [redacted]w[redacted]d return OB visit.  Your order is placed for your follow up growth ultrasound. The front desk should be able to help you schedule this for you.  We prefer for you to complete this on the day of your next visit so that we can go over the preliminary result with you after it is done! Please note: the ultrasound is scheduled as a second appointment.     We will see you again in 4 weeks.    Typical OB appointments    11-13 Weeks  New OB Visit    Natera and/or Nuchal Ultrasound (if desired)  16 Weeks  Return OB Visit    Natera if desired  20 Weeks  Return OB Visit    Anatomy US  24 Weeks  Return OB Visit   28 Weeks  Return OB Visit    28 Week Labs / Rhogam Injection (if indicated) / Whooping Cough Shot (Tdap)  30 Weeks  Return OB Visit   32 Weeks  Return OB Visit    Follow Up Ultrasound (if indicated)  34 Weeks  Return OB Visit  36 Weeks  Return OB Visit    GBS Swab  37-40 Weeks  Return OB Visit     1wk Postpartum Blood pressure check- in person (if appropriate)  2wk Postpartum Telehealth Visit- mood check (if appropriate)  6wk Postpartum In person visit/well woman exam    PLEASE NOTE!! Most of our labs are open from 8:00 to 4:45 pm. For your 28 week appointment, you will need labs. Please keep in mind when scheduling this appointment.       If you have any non-urgent questions or concerns you can send them via MyChart. Please keep in mind that most messages go to our nurses in the clinic to triage/review. These messages are not routinely checked on holidays or over the weekend. For urgent concerns, you should call the clinic that you are seen at. After hours, on holidays, or on the weekends you should call Labor and Delivery Triage at [phone] and requesting to the operator to speak to someone on labor and delivery, or by contacting the MARS line [phone].      How to Present to Triage    In a severe, acute situation you should be seen at your closest  medical facility.    If you are asked to come to triage (or to "L&D" / "labor and delivery"), you should go to [facility]'s [facility].     If you are coming for evaluation for anything on the list below, please call ahead to [phone] to notify our front desk you are coming, if possible.    Labor and Delivery is on the 6th floor of [facility]. You enter through the main front entrance of the hospital. Please wear a mask as you enter the facility. You can bring a support person with you. The transport team will bring you upstairs to our unit. We will typically check you in at the front desk, take you to a triage room, place you on the electronic fetal monitor, and a member of the team will evaluate you.      When to Notify your Provider    During pregnancy it is important that you maintain open communications with your health care provider in order to assure a safe and comfortable pregnancy.  Most of your questions and concerns about normal  changes and discomforts will be answered during your routine office visits or during regular office hours.                          There are a few symptoms; however, that your provider will want to know about as soon as they occur.  They could indicate a need for evaluation and treatment in order to prevent problems.  Call your doctor or midwife immediately and you will be advised whether or not your condition requires immediate attention.  If you are unable to contact your doctor, go to the OB floor at the hospital immediately.       1. Vaginal Bleeding - please be able to describe the amount (quarter-size, filled a pantiliner,  of maxipad) and color (bright red, brown)   2. Decrease in fetal activity. (If you notice this you can drink something cold, preferably water, rest, and eat a snack. If movement does not pick up in the hour you should come in).   3. Severe pain or cramping in the abdomen unrelieved by bowel movement  4. More  than 5 contractions or abdominal tightenings in 1 hour if before [redacted] weeks gestation  5. Strong contractions every 5 minutes for 1 to 2 hours if [redacted] weeks gestation or more  6. Sudden gush of water from the vagina that does not smell like urine (it is common to leak a small amount of urine when sneezing, coughing or vomiting)  1. The fluid will continue to leak after the initial gush.   7. Very frequent voiding or burning with urination  8. Persistent severe headache (especially with vision change), not relieved by rest, Tylenol, and adequate hydration.  9. Severe nausea and vomiting with inability to keep down fluids for a day  10. Chills and fever over 100.4 with cold or flu symptoms  11. Persistent and severe back pain (mild and moderate back pain can be a normal finding in pregnancy for most people)    [name], APRN, NP-C 02/25/2021, 18:10

## 2021-03-03 ENCOUNTER — Other Ambulatory Visit (HOSPITAL_BASED_OUTPATIENT_CLINIC_OR_DEPARTMENT_OTHER): Payer: Self-pay

## 2021-03-25 ENCOUNTER — Ambulatory Visit (HOSPITAL_BASED_OUTPATIENT_CLINIC_OR_DEPARTMENT_OTHER): Payer: BLUE CROSS/BLUE SHIELD

## 2021-03-25 ENCOUNTER — Encounter (HOSPITAL_BASED_OUTPATIENT_CLINIC_OR_DEPARTMENT_OTHER): Payer: Self-pay | Admitting: CERTIFIED NURSE MIDWIFE

## 2021-03-25 ENCOUNTER — Other Ambulatory Visit: Payer: Self-pay

## 2021-03-25 ENCOUNTER — Ambulatory Visit: Payer: BLUE CROSS/BLUE SHIELD | Attending: CERTIFIED NURSE MIDWIFE | Admitting: CERTIFIED NURSE MIDWIFE

## 2021-03-25 VITALS — BP 114/74 | Ht 67.0 in | Wt 239.4 lb

## 2021-03-25 DIAGNOSIS — O99282 Endocrine, nutritional and metabolic diseases complicating pregnancy, second trimester: Secondary | ICD-10-CM

## 2021-03-25 DIAGNOSIS — Z3490 Encounter for supervision of normal pregnancy, unspecified, unspecified trimester: Secondary | ICD-10-CM

## 2021-03-25 DIAGNOSIS — Z3482 Encounter for supervision of other normal pregnancy, second trimester: Secondary | ICD-10-CM | POA: Insufficient documentation

## 2021-03-25 DIAGNOSIS — E0500 Thyrotoxicosis with diffuse goiter without thyrotoxic crisis or storm: Secondary | ICD-10-CM

## 2021-03-25 DIAGNOSIS — Z3A24 24 weeks gestation of pregnancy: Secondary | ICD-10-CM

## 2021-03-25 DIAGNOSIS — O09292 Supervision of pregnancy with other poor reproductive or obstetric history, second trimester: Secondary | ICD-10-CM

## 2021-03-25 NOTE — Progress Notes (Signed)
[name] is a 30 y.o. [id number] here at [redacted]w[redacted]d for routine return antepartum visit.     Chief Complaint   Patient presents with   . Routine Prenatal Visit     Denies vaginal bleeding, loss of fluid, or regular contractions.   Reports positive fetal movement of baby girl.   Denies HA, dizziness, vision changes, chest pain, SOB, or RUQ pain.   Feeling well.     OB History   Gravida Para Term Preterm AB Living   3 2 2  0 0 2   SAB IAB Ectopic Multiple Live Births   0 0 0 0 2     Gestational age: [redacted]w[redacted]d    Weight: 109 kg (239 lb 6.7 oz)  BP (Non-Invasive): 114/74  Fundal Height: 20%  Preterm Labor: None  Fetal Movement: Present  Presentation: Transverse  Edema: Negative  FHR (1): 146 [redacted]w[redacted]d    General: A&O x4; in no acute distress   Respiratory: Respiratory rate regular; respirations unlabored   GI: Gravid; soft, non-tender; fundal height appropriate  Extremities: No significant edema, no erythema or excessive warmth  Psych: Affect appropriate; no evidence of anxiety or depression.       ICD-10-CM    1. Prenatal care  Z34.90         A:  IUP at [redacted]w[redacted]d  Rh negative   Grave's disease  Hx gestational hypertension    P:  Advised daily ldASA  Reviewed today's [redacted]w[redacted]d: all anatomy seen and WNL  Rhogam at 28 weeks and evaluation PP  Reviewed preterm labor precautions and warning signs to report.   Encouraged monitoring of fetal movement patterns  Disposition: Return in about 4 weeks (around 04/22/2021) for ROB.    Reagyn Facemire, APRN,MIDWIFE 03/26/2021, 14:14

## 2021-03-26 ENCOUNTER — Encounter (HOSPITAL_BASED_OUTPATIENT_CLINIC_OR_DEPARTMENT_OTHER): Payer: Self-pay | Admitting: CERTIFIED NURSE MIDWIFE

## 2021-03-26 DIAGNOSIS — Z3482 Encounter for supervision of other normal pregnancy, second trimester: Secondary | ICD-10-CM

## 2021-03-26 DIAGNOSIS — Z3A24 24 weeks gestation of pregnancy: Secondary | ICD-10-CM

## 2021-04-17 ENCOUNTER — Encounter (HOSPITAL_BASED_OUTPATIENT_CLINIC_OR_DEPARTMENT_OTHER): Payer: Self-pay

## 2021-04-17 ENCOUNTER — Other Ambulatory Visit (HOSPITAL_BASED_OUTPATIENT_CLINIC_OR_DEPARTMENT_OTHER): Payer: Self-pay

## 2021-04-17 DIAGNOSIS — Z3482 Encounter for supervision of other normal pregnancy, second trimester: Secondary | ICD-10-CM

## 2021-04-18 ENCOUNTER — Other Ambulatory Visit: Payer: Self-pay

## 2021-04-18 ENCOUNTER — Encounter (HOSPITAL_BASED_OUTPATIENT_CLINIC_OR_DEPARTMENT_OTHER): Payer: Self-pay | Admitting: INTERNAL MEDICINE-ENDOCRINOLOGY-DIABETES AND METABOLISM

## 2021-04-18 ENCOUNTER — Ambulatory Visit: Payer: BLUE CROSS/BLUE SHIELD

## 2021-04-18 ENCOUNTER — Encounter (HOSPITAL_BASED_OUTPATIENT_CLINIC_OR_DEPARTMENT_OTHER): Payer: Self-pay

## 2021-04-18 ENCOUNTER — Other Ambulatory Visit (HOSPITAL_BASED_OUTPATIENT_CLINIC_OR_DEPARTMENT_OTHER): Payer: BLUE CROSS/BLUE SHIELD | Admitting: Gynecology

## 2021-04-18 VITALS — BP 106/70 | Ht 66.0 in | Wt 244.5 lb

## 2021-04-18 DIAGNOSIS — Z3A28 28 weeks gestation of pregnancy: Secondary | ICD-10-CM

## 2021-04-18 DIAGNOSIS — Z3482 Encounter for supervision of other normal pregnancy, second trimester: Secondary | ICD-10-CM | POA: Insufficient documentation

## 2021-04-18 DIAGNOSIS — Z23 Encounter for immunization: Secondary | ICD-10-CM | POA: Insufficient documentation

## 2021-04-18 DIAGNOSIS — Z6791 Unspecified blood type, Rh negative: Secondary | ICD-10-CM

## 2021-04-18 DIAGNOSIS — Z8639 Personal history of other endocrine, nutritional and metabolic disease: Secondary | ICD-10-CM

## 2021-04-18 DIAGNOSIS — Z3483 Encounter for supervision of other normal pregnancy, third trimester: Secondary | ICD-10-CM

## 2021-04-18 DIAGNOSIS — O26899 Other specified pregnancy related conditions, unspecified trimester: Secondary | ICD-10-CM | POA: Insufficient documentation

## 2021-04-18 DIAGNOSIS — O99283 Endocrine, nutritional and metabolic diseases complicating pregnancy, third trimester: Secondary | ICD-10-CM

## 2021-04-18 DIAGNOSIS — E039 Hypothyroidism, unspecified: Secondary | ICD-10-CM

## 2021-04-18 DIAGNOSIS — O360130 Maternal care for anti-D [Rh] antibodies, third trimester, not applicable or unspecified: Secondary | ICD-10-CM

## 2021-04-18 LAB — GLUCOSE 1 HOUR POST DOSE
GLUCOLA LOT NUMBER: 454606
GLUCOSE 1 HR POST  DOSE: 159 mg/dL (ref 70–190)
VOLUME OF GLUCOLA GIVEN: 10 oz
WEIGHT OF DEXTROSE IN THE GLUCOLA GIVEN: 100 g

## 2021-04-18 LAB — ABO/RH AND ANTIBODY SCREEN
ABO/RH(D): A NEG
ANTIBODY SCREEN: NEGATIVE

## 2021-04-18 LAB — AST (SGOT): AST (SGOT): 10 U/L (ref 8–45)

## 2021-04-18 LAB — CBC
HCT: 34.8 % (ref 34.8–46.0)
HGB: 11.8 g/dL (ref 11.5–16.0)
MCH: 30.1 pg (ref 26.0–32.0)
MCHC: 33.9 g/dL (ref 31.0–35.5)
MCV: 88.8 fL (ref 78.0–100.0)
MPV: 11.1 fL (ref 8.7–12.5)
PLATELETS: 192 10*3/uL (ref 150–400)
RBC: 3.92 10*6/uL (ref 3.85–5.22)
RDW-CV: 13.4 % (ref 11.5–15.5)
WBC: 9.1 10*3/uL (ref 3.7–11.0)

## 2021-04-18 LAB — SYPHILIS SCREENING ALGORITHM WITH REFLEX, SERUM: SYPHILIS TP ANTIBODIES: NONREACTIVE

## 2021-04-18 LAB — GLUCOSE 3 HOUR POST DOSE: GLUCOSE 3 HRS POST  DOSE: 75 mg/dL (ref 70–145)

## 2021-04-18 LAB — PROTEIN/CREATININE RATIO, URINE, RANDOM
CREATININE RANDOM URINE: 72 mg/dL (ref 50–100)
PROTEIN RANDOM URINE: 7 mg/dL
PROTEIN/CREATININE RATIO RANDOM URINE: 97 mg/g (ref 10–105)

## 2021-04-18 LAB — THYROID STIMULATING HORMONE WITH FREE T4 REFLEX: TSH: 0.212 u[IU]/mL — ABNORMAL LOW (ref 0.430–3.550)

## 2021-04-18 LAB — THYROXINE, FREE (FREE T4): THYROXINE (T4), FREE: 0.92 ng/dL (ref 0.70–1.25)

## 2021-04-18 LAB — GLUCOSE 2 HOUR POST DOSE: GLUCOSE 2 HRS POST  DOSE: 117 mg/dL (ref 70–165)

## 2021-04-18 LAB — ALT (SGPT): ALT (SGPT): 9 U/L (ref 8–22)

## 2021-04-18 LAB — CREATININE
CREATININE: 0.6 mg/dL (ref 0.60–1.05)
ESTIMATED GFR: >90 mL/min/BSA (ref >=60)

## 2021-04-18 NOTE — Assessment & Plan Note (Addendum)
IUP @ [redacted]w[redacted]d  Feels well  Good fetal movement  Denies ctx, bleeding or LOF  Denies HA, CP, SOB, RUQ pain or vision changes  28 week labs today  Tdap today  Rhogam today    Patient inadvertently got 3 hr gtt drink instead of 1 hr.  Offered to have patient stay for a modified 3 hr gtt. She is staying to complete the 3 hr gtt minus the fasting.    Labor precautions and kick counts reviewed

## 2021-04-18 NOTE — Assessment & Plan Note (Signed)
TSH added to 28 week labs  Growth scans for the remained of her pregnancy ordered today

## 2021-04-18 NOTE — Patient Instructions (Signed)
[name],    It was wonderful to see you in the clinic today for your [redacted]w[redacted]d return OB visit.    You should be completing your 28wk labwork either today, or in the span between now and your next visit. This includes: your 1 hour glucose tolerance test, CBC (to check for anemia), Syphilis Screen, and a blood type and screen  - You are A NEGATIVE which means you do need a Rhogam injection.  - For your 1 hour glucose test: If your glucose level is above 140 we will have you do the 3 hour glucose test. The 3 hour glucose test is a fasting test- meaning you should not eat or drink for 8hrs before the test. We will send you those instructions.   - If you your Hemoglobin (Hgb) test is low (<11) we will recommend you start an iron supplement every other day, and increase your intake of iron rich foods.    PLEASE NOTE!! Most of our labs are open from 8:00 to 4:45 pm. You do not need an appointment for the lab, but please keep in mind when scheduling your OB appointment so you don't arrive after it hass closed. You must check in to the lab by 3:40 pm, then come for you OB appointment. We will get you back to the lab by the time assigned.    Prenatal Vaccinations:  We recommend getting the Whooping Cough Vaccine (tetanus) at a follow up visit between now and 34 weeks.  If you have not already received a COVID vaccine, we thoroughly recommend you get one.   We also strongly recommend a flu vaccine.  You can learn more about safe vaccinations in pregnancy at this link: http://lopez-wang.org/.pdf    I am attaching some useful information for you in this message including:  - How to Present to Triage and When to Notify Your Provider  -  Medicine Baby Friendly Breastfeeding Resources      If you have any non-urgent questions or concerns you can send them via MyChart. Please keep in mind that most messages go to our nurses in the  clinic to triage/review. These messages are not routinely checked on holidays or over the weekend. For urgent concerns, you should call the clinic that you are seen at. After hours, on holidays, or on the weekends you should call Labor and Delivery Triage at [phone] requesting to the operator to speak to someone on labor and delivery, or by contacting the MARS line [phone].    [name], APRN, NP-C 04/18/2021, 10:02      How to Present to Triage    In a severe, acute situation you should be seen at your closest medical facility.    If you are asked to come to triage (or to "L&D" / "labor and delivery"), you should go to [facility].    If you are coming for evaluation for anything on the list below, please call ahead to [phone] to notify our front desk you are coming, if possible.    The [facility] is on the 8th floor of [facility]. You enter through the main front entrance of the [facility]. Please wear a mask as you enter the facility. You can bring a support person with you. The transport team will bring you upstairs to our unit. We will typically check you in at the front desk, take you to a triage room, place you on the electronic fetal monitor, and a member of the  team will evaluate you.      When to Notify your Provider    During pregnancy it is important that you maintain open communications with your health care provider in order to assure a safe and comfortable pregnancy.  Most of your questions and concerns about normal changes and discomforts will be answered during your routine office visits or during regular office hours.                          There are a few symptoms; however, that your provider will want to know about as soon as they occur.  They could indicate a need for evaluation and treatment in order to prevent problems.  Call your doctor or midwife immediately and you will be advised whether or not your condition requires immediate  attention.  If you are unable to contact your doctor, go to the OB floor at the hospital immediately.       Vaginal Bleeding - please be able to describe the amount (quarter-size, filled a pantiliner,  of maxipad) and color (bright red, brown)   Decrease in fetal activity. (If you notice this you can drink something cold, preferably water, rest, and eat a snack. If movement does not pick up in the hour you should come in).   Severe pain or cramping in the abdomen unrelieved by bowel movement  More than 5 contractions or abdominal tightenings in 1 hour if before [redacted] weeks gestation  Strong contractions every 5 minutes for 1 to 2 hours if [redacted] weeks gestation or more  Sudden gush of water from the vagina that does not smell like urine (it is common to leak a small amount of urine when sneezing, coughing or vomiting)  The fluid will continue to leak after the initial gush.   Very frequent voiding or burning with urination  Persistent severe headache (especially with vision change), not relieved by rest, Tylenol, and adequate hydration.  Severe nausea and vomiting with inability to keep down fluids for a day  Chills and fever over 100.4 with cold or flu symptoms  Persistent and severe back pain (mild and moderate back pain can be a normal finding in pregnancy for most people)    [place] Friendly Breastfeeding Information / Resources  Information can be accessed through the following link: [email or web address]/ or       Breastfeeding and Childbirth Classes available.     [email or web address]

## 2021-04-18 NOTE — Nursing Note (Signed)
1. Are you 31 years of age or older? yes  2. Have you ever had a severe reaction to a flu shot? no  3. Are you allergic to eggs? no  4. Are you allergic to latex? no  5. Are you allergic to Thimerosol? no  6. Are you experiencing acute illness symptoms or have you been running a fever? no  7. Do you have a medical condition or taking medications that suppress your immune system? no  8. Have you ever had Guillain-Barre syndrome or other neurologic disorder? no  9. Are you pregnant or breastfeeding? yes    Immunization administered     Name Date Dose VIS Date Route    Influenza Vaccine, 6 month-adult 04/18/2021 0.5 mL 01/12/2020 Intramuscular    Site: Right arm    Given By: Joseph Berkshire, Ambulatory Care Assistant    Manufacturer: [employer]    Lot: [id number]    NDC: 09628366294    RHOGAM ADMINISTRATION 04/18/2021 1,500 Units N/A Intramuscular    Site: Right upper quad. gluteus    Given By: Joseph Berkshire, Ambulatory Care Assistant    Manufacturer: [employer]    Lot: [id number]    NDC: 35465681275    Tetanus Toxoid/Diphtheria Toxoid/Acellular Pertussis Vaccine, Adsorbed (Tdap) 04/18/2021 0.5 mL 01/12/2020 Intramuscular    Site: Left arm    Given By: Joseph Berkshire, Ambulatory Care Assistant    Manufacturer: [employer]    Lot: [id number]    NDC: 74944967591          Joseph Berkshire, Ambulatory Care Assistant 04/18/2021, 13:29

## 2021-04-18 NOTE — Progress Notes (Signed)
[facility]      RETURN OBSTETRICAL ENCOUNTER    PATIENT: [name]  CHART NUMBER: [id number]  DATE OF SERVICE: 04/18/2021    Subjective:  30 y.o. G3P2002 at [redacted]w[redacted]d presenting for [place] visit.     Pt reports doing well today. Denies contractions/cramping. No LOF or VB. + fetal movement. Denies CP, SOB, HA, vision changes, RUQ pain, N/V, changes in peripheral edema.     Dating Summary    Working EDD: 07/11/2021 set by Rosalio Macadamia, Ambulatory Care Assistant on 11/14/2020 based on Last Menstrual Period on 10/04/2020   Based On EDD GA Diff User Date    Last Menstrual Period on 10/04/2020 07/11/2021 Working Rosalio Macadamia, Ambulatory Care Assistant 11/14/2020               Objective:  BP 106/70    Ht 1.676 m (5\' 6" )    Wt 111 kg (244 lb 7.8 oz)    LMP 10/04/2020    BMI 39.46 kg/m       General: NAD  Chest: RRR, even non labored breathing on RA  Abdomen: Soft, gravid, + BS, NTTP  Extremities: No edema, no calf tenderness    Fundal height: 29  FHT: 140s    Weight: 111 kg (244 lb 7.8 oz)  BP (Non-Invasive): 106/70  A/P:  30 y.o. G3P2002 at [redacted]w[redacted]d here for routine OB care. Problem list updated as below:     Patient Active Problem List    Diagnosis Date Noted    Prenatal care, subsequent pregnancy 11/21/2020     Desires CNM care if available -- aware of midwife rotation and MD backup    Psychosocial   Planned pregnancy(?):    Certain LMP(?)    Primary support person:    Work during pregnancy(?):    Baby's sex/name:     Routine labs   Blood type:  A neg   AB screen: neg   H and H at NOB/date: 14.3/40.9 on 11/14/20   Rubella immunity: immune     HIV: neg   Hep B: neg   Hep C: neg   RPR or VDRL: NR     Gonorrhea: neg   Chlamydia: neg     CF carrier status:      Pap smear: normal     Urine culture: 22,000 mixed flora     28 week labs    H&H:     1 ht GTT:     AB screen:    Rhogam (if indicated):     Group B strep culture:     Fetal well being   First trimester  screening:   CfDNA:   Quad screen:    AFP only:       Anatomy US:    Immunizations   Pertussis:    Influenza:     Intrapartum/Postpartum plans   Pain relief:    Feeding: breast              Contraception:         History of Graves' disease   Hypothyroidism 05/29/2019     S/p ablation in 2018  Follows with [facility] Chrystine Oiler at [facility]  10/15 TSI: 3.9  12/7 TSH: 1.254  06/26/19: 1.14  09/03/19 TSH:  0.970  10/04/19: TSH: 0.878  Current Regimen: Synthroid 112 mcg x 6 days and 168 mcg x 1 day  S/p MFM consult   Plan for growth [place] at 28, 35, and 36 weeks.  Ultrasound 09/26/19 growth in the 73rd percentile   Ultrasound 10/25/2019 growth in the 62nd percentile   Continue weekly testing with NST and fluid check   Plan for delivery at 39-[redacted] weeks gestation    Will follow above plan for this pregnancy.  AT NOB  Synthroid 112 mcg 6 days, then 168 mcg x 2 for one day    12/17/20:  TSH = 2.542, T4 = 1.06 (continue current dose)      Obesity   PCOS 05/29/2019     Body mass index is 32.8 kg/m.  Early 1 hour gtt: 144  06/12/2019: 3 hour--92, 153, 130, 99   Hemoglobin A1c: 4.9  09/03/2019:3 hour -- 102, 176, 159, 96.      THIS PREGNANCY:  Early 1 hour GTT:    Two u/s for growth -- 32 and 36 weeks      Rh negative state in antepartum period 10/28/2013     Prenatal care, subsequent pregnancy  IUP @ [redacted]w[redacted]d  Feels well  Good fetal movement  Denies ctx, bleeding or LOF  Denies HA, CP, SOB, RUQ pain or vision changes  28 week labs today  Tdap today  Rhogam today    Patient inadvertently got 3 hr gtt drink instead of 1 hr.  Offered to have patient stay for a modified 3 hr gtt. She is staying to complete the 3 hr gtt minus the fasting.    Labor precautions and kick counts reviewed    History of Graves' disease   Hypothyroidism  TSH added to 28 week labs  Growth scans for the remained of her pregnancy ordered today    RTC in 2 week(s)    [name], APRN, NP-C 04/18/2021, 09:53

## 2021-04-22 ENCOUNTER — Encounter (HOSPITAL_BASED_OUTPATIENT_CLINIC_OR_DEPARTMENT_OTHER): Payer: Self-pay | Admitting: Family

## 2021-04-23 ENCOUNTER — Encounter (HOSPITAL_BASED_OUTPATIENT_CLINIC_OR_DEPARTMENT_OTHER): Payer: Self-pay

## 2021-04-23 ENCOUNTER — Ambulatory Visit (HOSPITAL_BASED_OUTPATIENT_CLINIC_OR_DEPARTMENT_OTHER): Payer: Self-pay

## 2021-04-28 ENCOUNTER — Ambulatory Visit (INDEPENDENT_AMBULATORY_CARE_PROVIDER_SITE_OTHER): Payer: Self-pay

## 2021-04-29 ENCOUNTER — Other Ambulatory Visit: Payer: Self-pay

## 2021-04-29 ENCOUNTER — Ambulatory Visit: Payer: BLUE CROSS/BLUE SHIELD

## 2021-04-29 DIAGNOSIS — Z3483 Encounter for supervision of other normal pregnancy, third trimester: Secondary | ICD-10-CM | POA: Insufficient documentation

## 2021-04-29 DIAGNOSIS — Z8639 Personal history of other endocrine, nutritional and metabolic disease: Secondary | ICD-10-CM | POA: Insufficient documentation

## 2021-04-29 DIAGNOSIS — Z3A29 29 weeks gestation of pregnancy: Secondary | ICD-10-CM

## 2021-04-30 ENCOUNTER — Encounter (HOSPITAL_BASED_OUTPATIENT_CLINIC_OR_DEPARTMENT_OTHER): Payer: Self-pay

## 2021-05-08 ENCOUNTER — Other Ambulatory Visit (HOSPITAL_BASED_OUTPATIENT_CLINIC_OR_DEPARTMENT_OTHER): Payer: Self-pay | Admitting: INTERNAL MEDICINE-ENDOCRINOLOGY-DIABETES AND METABOLISM

## 2021-05-12 NOTE — Telephone Encounter (Signed)
Patient had TSH on 11/11. Routing to provider to advise on levothyroxine dose.    Donnie Mesa, RN  05/12/2021, 15:54

## 2021-05-19 ENCOUNTER — Ambulatory Visit: Payer: BLUE CROSS/BLUE SHIELD | Attending: Family | Admitting: Family

## 2021-05-19 ENCOUNTER — Other Ambulatory Visit: Payer: Self-pay

## 2021-05-19 ENCOUNTER — Encounter (HOSPITAL_BASED_OUTPATIENT_CLINIC_OR_DEPARTMENT_OTHER): Payer: Self-pay | Admitting: Family

## 2021-05-19 VITALS — BP 108/74 | Ht 66.0 in | Wt 248.2 lb

## 2021-05-19 DIAGNOSIS — O360930 Maternal care for other rhesus isoimmunization, third trimester, not applicable or unspecified: Secondary | ICD-10-CM

## 2021-05-19 DIAGNOSIS — Z3A32 32 weeks gestation of pregnancy: Secondary | ICD-10-CM | POA: Insufficient documentation

## 2021-05-19 DIAGNOSIS — O99213 Obesity complicating pregnancy, third trimester: Secondary | ICD-10-CM

## 2021-05-19 DIAGNOSIS — E669 Obesity, unspecified: Secondary | ICD-10-CM | POA: Insufficient documentation

## 2021-05-19 NOTE — Progress Notes (Signed)
Patient seen by Thea Alken, APRN,NP-C    Christiane Ha Wolaver is a here for ROB visit [redacted]w[redacted]d.    Baby girl is moving well.  Denies the presence of: LOF, vaginal bleeding, cramping/contractions, N/V, HA, visual disturbances, and epigastric pain.      Denies any concerns today, reports she is feeling pretty good.     Dating Summary    Working EDD: 07/11/2021 set by Thurnell Lose, Ambulatory Care Assistant on 11/14/2020 based on Last Menstrual Period on 10/04/2020   Based On EDD GA Diff User Date    Last Menstrual Period on 10/04/2020 07/11/2021 Working Thurnell Lose, Ambulatory Care Assistant 11/14/2020               History  OB History     Gravida   3    Para   2    Term   2    Preterm        AB        Living   2       SAB        IAB        Ectopic        Multiple   0    Live Births   2               Past Medical History:   Diagnosis Date   . Gestational hypertension 11/01/2019   . Graves disease    . PCOS (polycystic ovarian syndrome)    . Seasonal allergic reaction     occasional OTC meds         Current Outpatient Medications   Medication Sig   . levothyroxine (SYNTHROID) 112 mcg Oral Tablet Take 1 pill daily Monday to Saturday and 1/2 a pill Sunday   . PNV Combo #19-Iron-Fol Ac-DHA 22-6-1-200 mg Oral Capsule Take 1 Cap by mouth Once a day     No Known Allergies    Weight: 113 kg (248 lb 3.8 oz)  BP (Non-Invasive): 108/74  # of Fetuses: 1  Preterm Labor: None  Fetal Movement: Present  Edema: Negative  FHR (1): 130s      General: A&O x4; in no acute distress, appears well  Respiratory: respirations unlabored; respiratory rate regular  Cardiovascular: appears well perfused   Extremities: extremities normal, atraumatic, no cyanosis or edema.   Psych: Oriented, affect appropriate, appropriate responses.      Assessment:     1. IUP at [redacted]w[redacted]d  2. Prepregnancy BMI 36  3. Rh negative: Rhogam-04/18/2021  4. Tdap and Flu: UTD      ICD-10-CM    1. [redacted] weeks gestation of pregnancy  Z3A.32 OBG US [phone]  Transabdominal Follow Up      2. Obesity  PCOS  E66.9 [place] 32992 Transabdominal Follow Up          Plan:     1. Growth ultrasound scheduled on 05/27/2021  2. Warning signs, precautions, reasons to call reviewed.   3. ROB in 2 wks    Thea Alken, APRN,NP-C

## 2021-05-27 ENCOUNTER — Ambulatory Visit: Payer: BLUE CROSS/BLUE SHIELD

## 2021-05-27 ENCOUNTER — Other Ambulatory Visit: Payer: Self-pay

## 2021-05-27 DIAGNOSIS — Z8639 Personal history of other endocrine, nutritional and metabolic disease: Secondary | ICD-10-CM | POA: Insufficient documentation

## 2021-05-27 DIAGNOSIS — Z3483 Encounter for supervision of other normal pregnancy, third trimester: Secondary | ICD-10-CM | POA: Insufficient documentation

## 2021-05-28 DIAGNOSIS — Z3A33 33 weeks gestation of pregnancy: Secondary | ICD-10-CM

## 2021-05-28 DIAGNOSIS — Z8639 Personal history of other endocrine, nutritional and metabolic disease: Secondary | ICD-10-CM

## 2021-06-03 ENCOUNTER — Encounter (HOSPITAL_BASED_OUTPATIENT_CLINIC_OR_DEPARTMENT_OTHER): Payer: Self-pay | Admitting: Obstetrics & Gynecology

## 2021-06-03 ENCOUNTER — Encounter (HOSPITAL_BASED_OUTPATIENT_CLINIC_OR_DEPARTMENT_OTHER): Payer: Self-pay | Admitting: Family

## 2021-06-03 ENCOUNTER — Encounter (HOSPITAL_COMMUNITY): Payer: Self-pay

## 2021-06-03 ENCOUNTER — Other Ambulatory Visit: Payer: Self-pay

## 2021-06-03 ENCOUNTER — Ambulatory Visit: Payer: BLUE CROSS/BLUE SHIELD | Attending: Obstetrics & Gynecology | Admitting: Obstetrics & Gynecology

## 2021-06-03 ENCOUNTER — Ambulatory Visit (HOSPITAL_BASED_OUTPATIENT_CLINIC_OR_DEPARTMENT_OTHER): Payer: Self-pay | Admitting: Student in an Organized Health Care Education/Training Program

## 2021-06-03 ENCOUNTER — Encounter (HOSPITAL_BASED_OUTPATIENT_CLINIC_OR_DEPARTMENT_OTHER): Payer: Self-pay

## 2021-06-03 ENCOUNTER — Ambulatory Visit (HOSPITAL_BASED_OUTPATIENT_CLINIC_OR_DEPARTMENT_OTHER): Payer: Self-pay

## 2021-06-03 VITALS — BP 122/80 | Ht 66.0 in | Wt 250.0 lb

## 2021-06-03 DIAGNOSIS — Z6791 Unspecified blood type, Rh negative: Secondary | ICD-10-CM

## 2021-06-03 DIAGNOSIS — Z3480 Encounter for supervision of other normal pregnancy, unspecified trimester: Secondary | ICD-10-CM | POA: Insufficient documentation

## 2021-06-03 DIAGNOSIS — E669 Obesity, unspecified: Secondary | ICD-10-CM

## 2021-06-03 DIAGNOSIS — O99283 Endocrine, nutritional and metabolic diseases complicating pregnancy, third trimester: Secondary | ICD-10-CM

## 2021-06-03 DIAGNOSIS — O360930 Maternal care for other rhesus isoimmunization, third trimester, not applicable or unspecified: Secondary | ICD-10-CM

## 2021-06-03 DIAGNOSIS — O99891 Other specified diseases and conditions complicating pregnancy: Secondary | ICD-10-CM

## 2021-06-03 DIAGNOSIS — Z3A34 34 weeks gestation of pregnancy: Secondary | ICD-10-CM

## 2021-06-03 DIAGNOSIS — E039 Hypothyroidism, unspecified: Secondary | ICD-10-CM

## 2021-06-03 DIAGNOSIS — O99213 Obesity complicating pregnancy, third trimester: Secondary | ICD-10-CM

## 2021-06-03 NOTE — Progress Notes (Signed)
[place] [facility] of Obstetric & Gynecology  RETURN OBSTETRICAL ENCOUNTER    [redacted]w[redacted]d  Chief Complaint   Patient presents with    Routine Prenatal Visit       Issues   Obesity in pregnancy  - Ultrasound for growth in 2 weeks     Hypothyroidism on replacement  - Please check TSH with next visit (lab closed - evening clinic)     Prenatal care  - TDAP / Rhogam / Influenza -- all given      Patient Active Problem List    Diagnosis Date Noted    Prenatal care, subsequent pregnancy 11/21/2020     Desires CNM care if available -- aware of midwife rotation and MD backup    Psychosocial   Planned pregnancy(?):    Certain LMP(?)    Primary support person:    Work during pregnancy(?):    Baby's sex/name:     Routine labs   Blood type:  A neg   AB screen: neg   H and H at NOB/date: 14.3/40.9 on 11/14/20   Rubella immunity: immune     HIV: neg   Hep B: neg   Hep C: neg   RPR or VDRL: NR     Gonorrhea: neg   Chlamydia: neg     CF carrier status:      Pap smear: normal     Urine culture: 22,000 mixed flora     28 week labs    H&H:     1 ht GTT:     AB screen:    Rhogam (if indicated):     Group B strep culture:     Fetal well being   First trimester screening:   CfDNA:   Quad screen:    AFP only:       Anatomy US:    Immunizations   Pertussis:    Influenza:     Intrapartum/Postpartum plans   Pain relief:    Feeding: breast              Contraception:         History of Graves' disease   Hypothyroidism 05/29/2019     S/p ablation in 2018  Follows with [facility] Ronny Flurry at [facility]  10/15 TSI: 3.9  12/7 TSH: 1.254  06/26/19: 1.14  09/03/19 TSH:  0.970  10/04/19: TSH: 0.878  Current Regimen: Synthroid 112 mcg x 6 days and 168 mcg x 1 day  S/p MFM consult   Plan for growth [place] at 28, 32, and 36 weeks.   Ultrasound 09/26/19 growth in the 73rd percentile   Ultrasound 10/25/2019 growth in the 62nd percentile   Continue weekly testing with NST and fluid check   Plan for delivery at 39-[redacted] weeks gestation    Will follow above plan for this  pregnancy.  AT NOB  Synthroid 112 mcg 6 days, then 168 mcg x 2 for one day    12/17/20:  TSH = 2.542, T4 = 1.06 (continue current dose)      Obesity   PCOS 05/29/2019     Body mass index is 32.8 kg/m.  Early 1 hour gtt: 144  06/12/2019: 3 hour--92, 153, 130, 99   Hemoglobin A1c: 4.9  09/03/2019:3 hour -- 102, 176, 159, 96.      THIS PREGNANCY:  Early 1 hour GTT:    Two u/s for growth -- 32 and 36 weeks      Rh negative state in antepartum period 10/28/2013  No Complaints    Dating Summary    Working EDD: 07/11/2021 set by Thurnell Lose, Ambulatory Care Assistant on 11/14/2020 based on Last Menstrual Period on 10/04/2020   Based On EDD GA Diff User Date    Last Menstrual Period on 10/04/2020 07/11/2021 Working Thurnell Lose, Ambulatory Care Assistant 11/14/2020                OB History   Gravida Para Term Preterm AB Living   3 2 2  0 0 2   SAB IAB Ectopic Multiple Live Births   0 0 0 0 2       Weight: 113 kg (250 lb)  BP (Non-Invasive): 122/80  # of Fetuses: 1  Fundal Height: 34  Preterm Labor: None  Fetal Movement: Present  Presentation: Cephalic  Edema: Negative  FHR (1): 165    Exam - appropriate for dates    No diagnosis found.    No orders of the defined types were placed in this encounter.      RTC 2 weeks    , MD  06/03/2021, 18:36

## 2021-06-04 ENCOUNTER — Ambulatory Visit (HOSPITAL_BASED_OUTPATIENT_CLINIC_OR_DEPARTMENT_OTHER): Payer: Self-pay

## 2021-06-04 ENCOUNTER — Encounter (HOSPITAL_BASED_OUTPATIENT_CLINIC_OR_DEPARTMENT_OTHER): Payer: Self-pay | Admitting: Student in an Organized Health Care Education/Training Program

## 2021-06-08 NOTE — L&D Delivery Note (Cosign Needed Addendum)
[facility]    Delivery Summary Note      Name: [name]   MRN: [id number]  DOB:  11-Apr-1991  Admitted: 07/07/2021 10:28 AM       Pre-delivery diagnosis:    1. 31 y.o. G3P2002 at [redacted]w[redacted]d gestation.   2. Rh negative  3. Hx of Graves disease  4. Obesity/PCOS    Post-delivery diagnosis:    1. Same plus delivery of a viable neonate.   2. First degree left vaginal laceration    Findings:           [name] is a 31 y.o. [id number] who was admitted for spontaneous labor contracting every 4-6 minutes upon arrival. She had SROM for clear fluid at 1525. She began spontaneously bearing down at 1555 and was found to be completley dilated.  She pushed with good maternal effort to a NSVD at 1602 of viable female infant in OA position. No nuchal cord noted, shoulders were then delivered with ease and infant placed on maternal abdomen where she completed transition; dried and stimulated for a lusty cry. IV Pitocin given by RN. Once cord stopped pulsating, double clamped and cut by FOB. Placenta delivered spontaneously via [place] at 1608. Inspected and intact, 3 vessels, central insertion. Perineum and vaginal vault inspected, 1st degree left vaginal laceration noted and repaired with 3.0 Vicryl suture in the usual fashion under local anesthesia; good approximation and hemostasis noted. Fundus firm and midline. APGARS 8/9. EBL 250cc. Infant skin to skin and breastfeeding on demand. Mother and baby in stable condition, pleased with baby and birth.    Carleene Cooper, APRN, CNM      "Delivery Information"    IO Blood Loss  07/07/21 0402 - 07/07/21 1802    EBL Hospital Encounter 250 mL    Total  250 mL         Brent, [name] [id number]    Labor Events    Preterm labor?: No  Antenatal steroids: None  Antibiotics received during labor?: No  Rupture date/time: 07/07/2021 1525  Rupture type: Spontaneous  Fluid color: Clear  Fluid odor: none  Labor type: Spontaneous Onset  of Labor  Augmentation: None  Labor complications: None          Labor Events    Preterm labor?: No  Antenatal steroids: None  Antibiotics received during labor?: No  Rupture date/time: 07/07/2021 1525  Rupture type: Spontaneous  Fluid color: Clear  Augmentation: None  Labor complications: None          Labor Event Times    Dilation complete Date/Time: 07/07/2021 1555     Delivery Anesthesia    Method: None     Assisted Delivery Details    Forceps attempted?: No  Vacuum extractor attempted?: No     Shoulder Dystocia Maneuvers    Shoulder dystocia present?: No     Lacerations    Episiotomy: None   Perineal lacerations: None    Vaginal laceration: Yes Repaired: Yes   Repair suture: 3-0 Vicryl  Number of repair packets: 1     Delivery Information    Birth date/time: 07/07/2021 1602  Sex: Female  Delivery type: Vaginal, Spontaneous  Complications: None     Newborn Presentation    Presentation: Vertex  Position: Occiput Anterior     Cord Information    Vessels: 3 Vessels  Complications: None  Delayed cord clamping?: Yes  Cord clamped date/time: 07/07/2021 1604  Cord blood disposition: Lab, Cord Segment Sent  Gases sent?: No  Stem cell collection (by MD): No     Resuscitation    Method: None     Newborn  Apgars    Living status: Living      Skin color:    Heart rate:    Reflex Irrit:    Muscle tone:    Resp. effort:    Total:     1 Min:    0    2    2    2    2    8     5  Min:    1    2    2    2    2    9     10  Min:     15 Min:     20 Min:       Apgars assigned by: WILSON RN     Skin to Skin    Skin to skin initiation: 07/07/2021 1609  Skin to skin with: Mother  Skin to skin end: 07/07/2021 1816     Newborn Measurements    Weight: 3160 g  Length: 50.2 cm  Head circumference: 34 cm  Chest circumference: 31.5 cm  Abdominal girth: 31 cm     Placenta    Date/time: 07/07/2021 1608  Removal: Spontaneous  Appearance: Intact, Para March  Disposition: Discarded     Other Delivery Procedures    Procedures: None     Delivery Providers     Delivering Clinician: Timothy Lasso, CNM   Provider Role   Odella Aquas, RN Registered Nurse   Randa Spike, RN Baby Nurse   Kristine Royal, APRN,MIDWIFE Midwife               Labor Event Times    Dilation complete date/time: 07/07/2021 1555     Labor Length    2nd stage: 0h 84m   3rd stage: 0h 83m             Timothy Lasso, CNM 08/21/2021, 07:48

## 2021-06-10 ENCOUNTER — Encounter (HOSPITAL_BASED_OUTPATIENT_CLINIC_OR_DEPARTMENT_OTHER): Payer: Self-pay

## 2021-06-12 ENCOUNTER — Other Ambulatory Visit: Payer: Self-pay

## 2021-06-12 ENCOUNTER — Other Ambulatory Visit
Payer: No Typology Code available for payment source | Attending: INTERNAL MEDICINE-ENDOCRINOLOGY-DIABETES AND METABOLISM

## 2021-06-12 DIAGNOSIS — E039 Hypothyroidism, unspecified: Secondary | ICD-10-CM | POA: Insufficient documentation

## 2021-06-12 LAB — THYROID STIMULATING HORMONE (SENSITIVE TSH): TSH: 0.479 u[IU]/mL (ref 0.430–3.550)

## 2021-06-13 ENCOUNTER — Encounter (HOSPITAL_BASED_OUTPATIENT_CLINIC_OR_DEPARTMENT_OTHER): Payer: Self-pay | Admitting: CERTIFIED NURSE MIDWIFE

## 2021-06-16 ENCOUNTER — Ambulatory Visit: Payer: No Typology Code available for payment source | Attending: Family | Admitting: Family

## 2021-06-16 ENCOUNTER — Encounter (HOSPITAL_BASED_OUTPATIENT_CLINIC_OR_DEPARTMENT_OTHER): Payer: Self-pay | Admitting: Family

## 2021-06-16 ENCOUNTER — Other Ambulatory Visit: Payer: Self-pay

## 2021-06-16 VITALS — BP 128/84 | Ht 66.0 in | Wt 252.0 lb

## 2021-06-16 DIAGNOSIS — Z8639 Personal history of other endocrine, nutritional and metabolic disease: Secondary | ICD-10-CM | POA: Insufficient documentation

## 2021-06-16 DIAGNOSIS — Z6791 Unspecified blood type, Rh negative: Secondary | ICD-10-CM

## 2021-06-16 DIAGNOSIS — E039 Hypothyroidism, unspecified: Secondary | ICD-10-CM

## 2021-06-16 DIAGNOSIS — O99891 Other specified diseases and conditions complicating pregnancy: Secondary | ICD-10-CM

## 2021-06-16 DIAGNOSIS — Z3685 Encounter for antenatal screening for Streptococcus B: Secondary | ICD-10-CM | POA: Insufficient documentation

## 2021-06-16 DIAGNOSIS — O99283 Endocrine, nutritional and metabolic diseases complicating pregnancy, third trimester: Secondary | ICD-10-CM

## 2021-06-16 DIAGNOSIS — Z3A36 36 weeks gestation of pregnancy: Secondary | ICD-10-CM | POA: Insufficient documentation

## 2021-06-16 NOTE — Progress Notes (Signed)
Patient seen by Hedy Jacob, APRN,NP-C    Katie Carl [name] is a here for ROB visit [redacted]w[redacted]d.    Baby girl is moving well.  Denies the presence of: LOF, vaginal bleeding, cramping/contractions, N/V, HA, visual disturbances, and epigastric pain.      Denies any concerns today, reports she is feeling pretty good.     Dating Summary    Working EDD: 07/11/2021 set by Rosalio Macadamia, Ambulatory Care Assistant on 11/14/2020 based on Last Menstrual Period on 10/04/2020   Based On EDD GA Diff User Date    Last Menstrual Period on 10/04/2020 07/11/2021 Working Rosalio Macadamia, Ambulatory Care Assistant 11/14/2020               History  OB History     Gravida   3    Para   2    Term   2    Preterm        AB        Living   2       SAB        IAB        Ectopic        Multiple   0    Live Births   2               Past Medical History:   Diagnosis Date   . Gestational hypertension 11/01/2019   . Graves disease    . PCOS (polycystic ovarian syndrome)    . Seasonal allergic reaction     occasional OTC meds         Current Outpatient Medications   Medication Sig   . levothyroxine (SYNTHROID) 112 mcg Oral Tablet Take 1 pill daily Monday to Saturday and 1/2 a pill Sunday   . PNV Combo #19-Iron-Fol Ac-DHA 22-6-1-200 mg Oral Capsule Take 1 Cap by mouth Once a day     No Known Allergies    Weight: 114 kg (251 lb 15.8 oz)  BP (Non-Invasive): 128/84  # of Fetuses: 1  Fundal Height: 37  Preterm Labor: [place]  Fetal Movement: Present  Edema: Negative  FHR (1): 140s      General: A&O x4; in no acute distress, appears well  Respiratory: respirations unlabored; respiratory rate regular  Cardiovascular: appears well perfused   Extremities: extremities normal, atraumatic, no cyanosis or edema.   Psych: Oriented, affect appropriate, appropriate responses.      Assessment:     1. IUP at [redacted]w[redacted]d  2. Prepregnancy BMI 36  3. Rh negative: Rhogam-04/18/2021  4. Tdap and Flu: UTD      ICD-10-CM    1. [redacted] weeks gestation of  pregnancy  Z3A.36 GROUP B STREPTOCOCCUS DNA BY PCR      2. History of Graves' disease  Hypothyroidism  Z86.39 THYROID STIMULATING HORMONE WITH FREE T4 REFLEX          Plan:     1. GBS obtained today per pt's consent.   2. Growth ultrasound scheduled on 06/24/2021  3. TSH ordered.   4. Warning signs, precautions, reasons to call reviewed.   5. ROB in 1 wks    Hedy Jacob, APRN,NP-C

## 2021-06-17 LAB — GROUP B STREPTOCOCCUS DNA BY NAAT: GROUP B STREPTOCOCCUS (GBS) DNA BY NAAT: NEGATIVE

## 2021-06-19 ENCOUNTER — Encounter (HOSPITAL_BASED_OUTPATIENT_CLINIC_OR_DEPARTMENT_OTHER): Payer: Self-pay | Admitting: Family

## 2021-06-23 ENCOUNTER — Ambulatory Visit: Payer: No Typology Code available for payment source | Admitting: Family

## 2021-06-23 ENCOUNTER — Other Ambulatory Visit: Payer: Self-pay

## 2021-06-23 ENCOUNTER — Encounter (HOSPITAL_BASED_OUTPATIENT_CLINIC_OR_DEPARTMENT_OTHER): Payer: Self-pay | Admitting: Family

## 2021-06-23 VITALS — BP 128/80 | Ht 66.0 in | Wt 250.9 lb

## 2021-06-23 DIAGNOSIS — Z3A37 37 weeks gestation of pregnancy: Secondary | ICD-10-CM | POA: Insufficient documentation

## 2021-06-23 DIAGNOSIS — Z6837 Body mass index (BMI) 37.0-37.9, adult: Secondary | ICD-10-CM

## 2021-06-23 DIAGNOSIS — O99213 Obesity complicating pregnancy, third trimester: Secondary | ICD-10-CM

## 2021-06-23 DIAGNOSIS — O360930 Maternal care for other rhesus isoimmunization, third trimester, not applicable or unspecified: Secondary | ICD-10-CM

## 2021-06-23 NOTE — Progress Notes (Signed)
Patient seen by Thea Alken, APRN,NP-C    [name] is a here for ROB visit [redacted]w[redacted]d.    Baby girl is moving well.  Denies the presence of: LOF, vaginal bleeding, cramping/contractions, N/V, HA, visual disturbances, and epigastric pain.      Denies any concerns today.     Dating Summary    Working EDD: 07/11/2021 set by Thurnell Lose, Ambulatory Care Assistant on 11/14/2020 based on Last Menstrual Period on 10/04/2020   Based On EDD GA Diff User Date    Last Menstrual Period on 10/04/2020 07/11/2021 Working Thurnell Lose, Ambulatory Care Assistant 11/14/2020               History  OB History     Gravida   3    Para   2    Term   2    Preterm        AB        Living   2       SAB        IAB        Ectopic        Multiple   0    Live Births   2               Past Medical History:   Diagnosis Date   . Gestational hypertension 11/01/2019   . Graves disease    . PCOS (polycystic ovarian syndrome)    . Seasonal allergic reaction     occasional OTC meds       Current Outpatient Medications   Medication Sig   . levothyroxine (SYNTHROID) 112 mcg Oral Tablet Take 1 pill daily Monday to Saturday and 1/2 a pill Sunday   . PNV Combo #19-Iron-Fol Ac-DHA 22-6-1-200 mg Oral Capsule Take 1 Cap by mouth Once a day     No Known Allergies    Weight: 114 kg (250 lb 14.1 oz)  BP (Non-Invasive): 128/80  # of Fetuses: 1  Fundal Height: 38  Preterm Labor: Deberah Pelton  Fetal Movement: Present  Edema: Negative  FHR (1): 140s      General: A&O x4; in no acute distress, appears well  Respiratory: respirations unlabored; respiratory rate regular  Cardiovascular: appears well perfused   Extremities: extremities normal, atraumatic, no cyanosis or edema.   Psych: Oriented, affect appropriate, appropriate responses.      Assessment:     1. IUP at [redacted]w[redacted]d  2. Prepregnancy BMI 36  3. Rh negative: Rhogam-04/18/2021  4. Tdap and Flu: UTD  5. GBS negative      ICD-10-CM    1. [redacted] weeks gestation of pregnancy  Z3A.37            Plan:     1. Growth ultrasound scheduled on 06/24/2021  2. Warning signs, precautions, reasons to call reviewed.   3. ROB in 1 wks    Thea Alken, APRN,NP-C

## 2021-06-24 ENCOUNTER — Ambulatory Visit: Payer: No Typology Code available for payment source

## 2021-06-24 DIAGNOSIS — Z3483 Encounter for supervision of other normal pregnancy, third trimester: Secondary | ICD-10-CM | POA: Insufficient documentation

## 2021-06-24 DIAGNOSIS — Z8639 Personal history of other endocrine, nutritional and metabolic disease: Secondary | ICD-10-CM | POA: Insufficient documentation

## 2021-06-27 ENCOUNTER — Other Ambulatory Visit (HOSPITAL_BASED_OUTPATIENT_CLINIC_OR_DEPARTMENT_OTHER): Payer: No Typology Code available for payment source | Admitting: Gynecology

## 2021-06-27 ENCOUNTER — Encounter (HOSPITAL_BASED_OUTPATIENT_CLINIC_OR_DEPARTMENT_OTHER): Payer: Self-pay | Admitting: Family

## 2021-06-27 ENCOUNTER — Encounter (HOSPITAL_BASED_OUTPATIENT_CLINIC_OR_DEPARTMENT_OTHER): Payer: Self-pay

## 2021-06-27 ENCOUNTER — Ambulatory Visit: Payer: No Typology Code available for payment source

## 2021-06-27 ENCOUNTER — Other Ambulatory Visit: Payer: Self-pay

## 2021-06-27 VITALS — BP 134/84 | Ht 66.0 in | Wt 252.9 lb

## 2021-06-27 DIAGNOSIS — Z3483 Encounter for supervision of other normal pregnancy, third trimester: Secondary | ICD-10-CM | POA: Insufficient documentation

## 2021-06-27 DIAGNOSIS — O163 Unspecified maternal hypertension, third trimester: Secondary | ICD-10-CM | POA: Insufficient documentation

## 2021-06-27 DIAGNOSIS — Z8639 Personal history of other endocrine, nutritional and metabolic disease: Secondary | ICD-10-CM

## 2021-06-27 DIAGNOSIS — Z3A38 38 weeks gestation of pregnancy: Secondary | ICD-10-CM

## 2021-06-27 LAB — CREATININE
CREATININE: 0.64 mg/dL (ref 0.60–1.05)
ESTIMATED GFR: >90 mL/min/BSA (ref >=60)

## 2021-06-27 LAB — PROTEIN/CREATININE RATIO, URINE, RANDOM
CREATININE RANDOM URINE: 7 mg/dL — ABNORMAL LOW (ref 50–100)
PROTEIN RANDOM URINE: <7 mg/dL

## 2021-06-27 LAB — CBC
HCT: 36 % (ref 34.8–46.0)
HGB: 12.8 g/dL (ref 11.5–16.0)
MCH: 30.5 pg (ref 26.0–32.0)
MCHC: 35.6 g/dL — ABNORMAL HIGH (ref 31.0–35.5)
MCV: 85.9 fL (ref 78.0–100.0)
MPV: 10.4 fL (ref 8.7–12.5)
PLATELETS: 209 10*3/uL (ref 150–400)
RBC: 4.19 10*6/uL (ref 3.85–5.22)
RDW-CV: 13.9 % (ref 11.5–15.5)
WBC: 10.4 10*3/uL (ref 3.7–11.0)

## 2021-06-27 LAB — AST (SGOT): AST (SGOT): 11 U/L (ref 8–45)

## 2021-06-27 LAB — ALT (SGPT): ALT (SGPT): 7 U/L — ABNORMAL LOW (ref 8–22)

## 2021-06-27 NOTE — Assessment & Plan Note (Addendum)
Home BP 140/82  Office BP 134/84  She has had a few other elevated pressures at home  HELLP labs drawn  NST done, see procedure note  PE warning signs reviewed extensively

## 2021-06-27 NOTE — Progress Notes (Signed)
[facility] of Ob[facility]ne[facility]      RETURN OBSTETRICAL ENCOUNTER    PATIENT: Johnnita Fouch Arenivas  CHART NUMBER: [id number]  DATE OF SERVICE: 06/27/2021    Subjective:  31 y.o. G3P2002 at [redacted]w[redacted]d presenting for [place] visit.     She present with elevated blood pressure at home and RUQ pain. Her blood pressure at home was 140/82 and she's been noticing some twinges like a pulled muscle in her RUQ area. She also reports a very mild HA, but has not needed to take tylenol for it.     She is having BH contractions. No LOF or VB. She has adequate fetal movement.   Denies CP, SOB, vision changes, N/V, changes in peripheral edema.     Dating Summary    Working EDD: 07/11/2021 set by Rosalio Macadamia, Ambulatory Care Assistant on 11/14/2020 based on Last Menstrual Period on 10/04/2020   Based On EDD GA Diff User Date    Last Menstrual Period on 10/04/2020 07/11/2021 Working Rosalio Macadamia, [place] Assistant 11/14/2020             Objective:  BP 134/84   Ht 1.676 m (5\' 6" )   Wt 115 kg (252 lb 13.9 oz)   LMP 10/04/2020   BMI 40.81 kg/m     General: NAD  Chest: RRR, even non labored breathing on RA  Abdomen: Soft, gravid, + BS, NTTP  Extremities: Trace edema, no calf tenderness    Fundal height: 38  FHT: 150s    Weight: 115 kg (252 lb 13.9 oz)  BP (Non-Invasive): 134/84  # of Fetuses: 1  Fundal Height: 38  Preterm Labor: Montine Circle  Fetal Movement: Present  Edema: Trace  FHR (1): 140s    A/P:  31 y.o. G3P2002 at [redacted]w[redacted]d here for routine OB care. Problem list updated as below:     Patient Active Problem List    Diagnosis Date Noted   . Elevated blood pressure affecting pregnancy in third trimester, antepartum 06/27/2021   . Prenatal care, subsequent pregnancy 11/21/2020     Desires CNM care if available -- aware of midwife rotation and MD backup    Psychosocial   Planned pregnancy(?):    Certain LMP(?)    Primary support person:    Work during pregnancy(?):    Baby's sex/name:     Routine  labs   Blood type:  A neg   AB screen: neg   H and H at NOB/date: 14.3/40.9 on 11/14/20   Rubella immunity: immune     HIV: neg   Hep B: neg   Hep C: neg   RPR or VDRL: NR     Gonorrhea: neg   Chlamydia: neg     CF carrier status:      Pap smear: normal     Urine culture: 22,000 mixed flora     28 week labs    H&H:     1 ht GTT:     AB screen:    Rhogam (if indicated):     Group B strep culture:     Fetal well being   First trimester screening:   CfDNA:   Quad screen:    AFP only:       Anatomy US:    Immunizations   Pertussis:    Influenza:     Intrapartum/Postpartum plans   Pain relief:    Feeding: breast              Contraception:        .  History of Graves' disease  Hypothyroidism 05/29/2019     S/p ablation in 2018  Follows with Dr. Ronny Flurry at [facility]  10/15 TSI: 3.9  12/7 TSH: 1.254  06/26/19: 1.14  09/03/19 TSH:  0.970  10/04/19: TSH: 0.878  Current Regimen: Synthroid 112 mcg x 6 days and 168 mcg x 1 day  S/p MFM consult   Plan for growth [place] at 28, 32, and 36 weeks.   Ultrasound 09/26/19 growth in the 73rd percentile   Ultrasound 10/25/2019 growth in the 62nd percentile   Continue weekly testing with NST and fluid check   Plan for delivery at 39-[redacted] weeks gestation    Will follow above plan for this pregnancy.  AT NOB  Synthroid 112 mcg 6 days, then 168 mcg x 2 for one day    12/17/20:  TSH = 2.542, T4 = 1.06 (continue current dose)     . Obesity  PCOS 05/29/2019     Body mass index is 32.8 kg/m.  Early 1 hour gtt: 144  06/12/2019: 3 hour--92, 153, 130, 99   Hemoglobin A1c: 4.9  09/03/2019:3 hour -- 102, 176, 159, 96.      THIS PREGNANCY:  Early 1 hour GTT:    Two u/s for growth -- 32 and 36 weeks     . Rh negative state in antepartum period 10/28/2013     Elevated blood pressure affecting pregnancy in third trimester, antepartum  Home BP 140/82  Office BP 134/84  She has had a few other elevated pressures at home  HELLP labs drawn  NST done, see procedure note  PE warning signs reviewed extensively    RTC in 1  week(s)    [name], APRN, NP-C 06/27/2021, 16:04

## 2021-06-27 NOTE — Procedures (Signed)
Department of Obstetric & Gynecology      Fetal Non-Stress Test    PATIENT: 31 y.o.   CHART NUMBER: [id number]   DATE OF SERVICE: 06/27/2021       INDICATION: Elevated BP     FINDINGS:   Baseline: 145  Variability: Moderate  Accelerations: Present  Decelerations: None  Contractions: None    IMPRESSION : Reactive    [name], APRN, NP-C 06/27/2021, 16:37

## 2021-07-01 ENCOUNTER — Other Ambulatory Visit: Payer: Self-pay

## 2021-07-01 ENCOUNTER — Ambulatory Visit: Payer: No Typology Code available for payment source

## 2021-07-01 ENCOUNTER — Encounter (HOSPITAL_BASED_OUTPATIENT_CLINIC_OR_DEPARTMENT_OTHER): Payer: Self-pay

## 2021-07-01 VITALS — BP 130/84 | Ht 66.0 in | Wt 251.1 lb

## 2021-07-01 DIAGNOSIS — Z3483 Encounter for supervision of other normal pregnancy, third trimester: Secondary | ICD-10-CM | POA: Insufficient documentation

## 2021-07-01 DIAGNOSIS — E039 Hypothyroidism, unspecified: Secondary | ICD-10-CM

## 2021-07-01 DIAGNOSIS — O99283 Endocrine, nutritional and metabolic diseases complicating pregnancy, third trimester: Secondary | ICD-10-CM

## 2021-07-01 DIAGNOSIS — O163 Unspecified maternal hypertension, third trimester: Secondary | ICD-10-CM | POA: Insufficient documentation

## 2021-07-01 DIAGNOSIS — Z3A38 38 weeks gestation of pregnancy: Secondary | ICD-10-CM

## 2021-07-01 DIAGNOSIS — O360930 Maternal care for other rhesus isoimmunization, third trimester, not applicable or unspecified: Secondary | ICD-10-CM

## 2021-07-01 NOTE — Progress Notes (Signed)
[facility] Department of Obstetric & Gynecology      RETURN OBSTETRICAL ENCOUNTER    PATIENT: [name]  CHART NUMBER: [id number]  DATE OF SERVICE: 07/01/2021    Subjective:  31 y.o. G3P2002 at 102w4d presenting for [place] visit.     She has no complaints today. Denies contractions/cramping. No LOF or VB. She has adequate fetal movement. Denies CP, SOB, HA, vision changes, RUQ pain, N/V, changes in peripheral edema.     Dating Summary    Working EDD: 07/11/2021 set by Rosalio Macadamia, Ambulatory Care Assistant on 11/14/2020 based on Last Menstrual Period on 10/04/2020   Based On EDD GA Diff User Date    Last Menstrual Period on 10/04/2020 07/11/2021 Working Rosalio Macadamia, Ambulatory Care Assistant 11/14/2020               Objective:  BP 130/84    Ht 1.676 m (5\' 6" )    Wt 114 kg (251 lb 1.7 oz)    LMP 10/04/2020    BMI 40.53 kg/m       General: NAD  Chest: RRR, even non labored breathing on RA  Abdomen: Soft, gravid, + BS, NTTP  Extremities: No edema, no calf tenderness    Fundal height: 39  FHT: 140s    Weight: 114 kg (251 lb 1.7 oz)  BP (Non-Invasive): 130/84    A/P:  31 y.o. G3P2002 at [redacted]w[redacted]d here for routine OB care. Problem list updated as below:     Patient Active Problem List    Diagnosis Date Noted    Elevated blood pressure affecting pregnancy in third trimester, antepartum 06/27/2021    Prenatal care, subsequent pregnancy 11/21/2020     Desires CNM care if available -- aware of midwife rotation and MD backup    Psychosocial   Planned pregnancy(?):    Certain LMP(?)    Primary support person:    Work during pregnancy(?):    76 sex/name:     Routine labs   Blood type:  A neg   AB screen: neg   H and H at NOB/date: 14.3/40.9 on 11/14/20   Rubella immunity: immune     HIV: neg   Hep B: neg   Hep C: neg   RPR or VDRL: NR     Gonorrhea: neg   Chlamydia: neg     CF carrier status:      Pap smear: normal     Urine culture: 22,000 mixed flora     28 week labs    H&H:     1 ht GTT:     AB  screen:    Rhogam (if indicated):     Group B strep culture:     Fetal well being   First trimester screening:   CfDNA:   Quad screen:    AFP only:       Anatomy US:    Immunizations   Pertussis:    Influenza:     Intrapartum/Postpartum plans   Pain relief:    Feeding: breast              Contraception:         History of Graves' disease   Hypothyroidism 05/29/2019     S/p ablation in 2018  Follow[facility] at [facility]  10/15 TSI: 3.9  12/7 TSH: 1.254  06/26/19: 1.14  09/03/19 TSH:  0.970  10/04/19: TSH: 0.878  Current Regimen: Synthroid 112 mcg x 6 days and 168 mcg x 1 day  S/p MFM consult   Plan for growth [place] at 28, 89, and 36 weeks.   Ultrasound 09/26/19 growth in the 73rd percentile   Ultrasound 10/25/2019 growth in the 62nd percentile   Continue weekly testing with NST and fluid check   Plan for delivery at 39-[redacted] weeks gestation    Will follow above plan for this pregnancy.  AT NOB  Synthroid 112 mcg 6 days, then 168 mcg x 2 for one day    12/17/20:  TSH = 2.542, T4 = 1.06 (continue current dose)      Obesity   PCOS 05/29/2019     Body mass index is 32.8 kg/m.  Early 1 hour gtt: 144  06/12/2019: 3 hour--92, 153, 130, 99   Hemoglobin A1c: 4.9  09/03/2019:3 hour -- 102, 176, 159, 96.      THIS PREGNANCY:  Early 1 hour GTT:    Two u/s for growth -- 32 and 36 weeks      Rh negative state in antepartum period 10/28/2013     Prenatal care, subsequent pregnancy  IUP @ [redacted]w[redacted]d   A NEGATIVE  Baby girl    GBS negative  Rubella immune  Flu shot UTD    BP normotensive today  Has had a few more 140/90s at home  IOL scheduled for Friday    Labor precautions, when to go to L&D and kick counts reviewed    RTC in 1 week(s)    Renaee Munda, APRN, NP-C 07/01/2021, 17:41

## 2021-07-01 NOTE — Assessment & Plan Note (Signed)
IUP @ [redacted]w[redacted]d   A NEGATIVE  Baby girl    GBS negative  Rubella immune  Flu shot UTD    BP normotensive today  Has had a few more 140/90s at home  IOL scheduled for Friday    Labor precautions, when to go to L&D and kick counts reviewed

## 2021-07-03 ENCOUNTER — Encounter (HOSPITAL_BASED_OUTPATIENT_CLINIC_OR_DEPARTMENT_OTHER): Payer: Self-pay

## 2021-07-03 ENCOUNTER — Other Ambulatory Visit: Payer: Self-pay

## 2021-07-03 ENCOUNTER — Ambulatory Visit: Payer: No Typology Code available for payment source

## 2021-07-03 ENCOUNTER — Encounter (HOSPITAL_BASED_OUTPATIENT_CLINIC_OR_DEPARTMENT_OTHER): Payer: Self-pay | Admitting: CERTIFIED NURSE MIDWIFE

## 2021-07-03 DIAGNOSIS — O99891 Other specified diseases and conditions complicating pregnancy: Secondary | ICD-10-CM

## 2021-07-03 DIAGNOSIS — Z3A38 38 weeks gestation of pregnancy: Secondary | ICD-10-CM

## 2021-07-03 DIAGNOSIS — Z6791 Unspecified blood type, Rh negative: Secondary | ICD-10-CM

## 2021-07-03 DIAGNOSIS — Z3483 Encounter for supervision of other normal pregnancy, third trimester: Secondary | ICD-10-CM | POA: Insufficient documentation

## 2021-07-03 NOTE — Progress Notes (Signed)
[facility] Department of Obstetric & Gynecology      RETURN OBSTETRICAL ENCOUNTER    PATIENT: [name]  CHART NUMBER: [id number]  DATE OF SERVICE: 07/03/2021    Subjective:  31 y.o. G3P2002 at [redacted]w[redacted]d presenting for [place] visit.     She has no complaints today. She is here for a cervical exam due to frequent contractions. No LOF or VB. She has adequate fetal movement. Denies CP, SOB, HA, vision changes, RUQ pain, N/V, changes in peripheral edema.     Dating Summary    Working EDD: 07/11/2021 set by Rosalio Macadamia, Ambulatory Care Assistant on 11/14/2020 based on Last Menstrual Period on 10/04/2020   Based On EDD GA Diff User Date    Last Menstrual Period on 10/04/2020 07/11/2021 Working Rosalio Macadamia, Ambulatory Care Assistant 11/14/2020               Objective:  BP 126/82   Ht 1.676 m (5\' 6" )   Wt 113 kg (249 lb 5.4 oz)   LMP 10/04/2020   BMI 40.24 kg/m       General: NAD  Chest: RRR, even non labored breathing on RA  Abdomen: Soft, gravid, + BS, NTTP  Extremities: No edema, no calf tenderness    Fundal height: 38  FHT: 150s    Weight: 113 kg (249 lb 5.4 oz)  BP (Non-Invasive): 126/82    A/P:  31 y.o. G3P2002 at [redacted]w[redacted]d here for routine OB care. Problem list updated as below:     Patient Active Problem List    Diagnosis Date Noted   . Elevated blood pressure affecting pregnancy in third trimester, antepartum 06/27/2021   . Prenatal care, subsequent pregnancy 11/21/2020     Desires CNM care if available -- aware of midwife rotation and MD backup    Psychosocial   Planned pregnancy(?):    Certain LMP(?)    Primary support person:    Work during pregnancy(?):    Baby's sex/name:     Routine labs   Blood type:  A neg   AB screen: neg   H and H at NOB/date: 14.3/40.9 on 11/14/20   Rubella immunity: immune     HIV: neg   Hep B: neg   Hep C: neg   RPR or VDRL: NR     Gonorrhea: neg   Chlamydia: neg     CF carrier status:      Pap smear: normal     Urine culture: 22,000 mixed flora     28 week  labs    H&H:     1 ht GTT:     AB screen:    Rhogam (if indicated):     Group B strep culture:     Fetal well being   First trimester screening:   CfDNA:   Quad screen:    AFP only:       Anatomy US:    Immunizations   Pertussis:    Influenza:     Intrapartum/Postpartum plans   Pain relief:    Feeding: breast              Contraception:        . History of Graves' disease  Hypothyroidism 05/29/2019     S/p ablation in 2018  Follows with Dr. Ch[facility]ine Oiler at [facility]  10/15 TSI: 3.9  12/7 TSH: 1.254  06/26/19: 1.14  09/03/19 TSH:  0.970  10/04/19: TSH: 0.878  Current Regimen: Synthroid 112 mcg x 6 days and 168 mcg  x 1 day  S/p MFM consult   Plan for growth [place] at 28, 71, and 36 weeks.   Ultrasound 09/26/19 growth in the 73rd percentile   Ultrasound 10/25/2019 growth in the 62nd percentile   Continue weekly testing with NST and fluid check   Plan for delivery at 39-[redacted] weeks gestation    Will follow above plan for this pregnancy.  AT NOB  Synthroid 112 mcg 6 days, then 168 mcg x 2 for one day    12/17/20:  TSH = 2.542, T4 = 1.06 (continue current dose)     . Obesity  PCOS 05/29/2019     Body mass index is 32.8 kg/m.  Early 1 hour gtt: 144  06/12/2019: 3 hour--92, 153, 130, 99   Hemoglobin A1c: 4.9  09/03/2019:3 hour -- 102, 176, 159, 96.      THIS PREGNANCY:  Early 1 hour GTT:    Two u/s for growth -- 32 and 36 weeks     . Rh negative state in antepartum period 10/28/2013     Prenatal care, subsequent pregnancy  IUP @ [redacted]w[redacted]d   A NEGATIVE  GBS negative    SVE: 1/50/-2    Labor precautions, when to go to L&D and kick counts reviewed    RTC in 1 week(s)    Renaee Munda, APRN, NP-C 07/03/2021, 17:36

## 2021-07-03 NOTE — Assessment & Plan Note (Signed)
IUP @ [redacted]w[redacted]d   A NEGATIVE  GBS negative    SVE: 1/50/-2    Labor precautions, when to go to L&D and kick counts reviewed

## 2021-07-04 ENCOUNTER — Ambulatory Visit: Payer: No Typology Code available for payment source

## 2021-07-04 ENCOUNTER — Encounter (HOSPITAL_BASED_OUTPATIENT_CLINIC_OR_DEPARTMENT_OTHER): Payer: Self-pay

## 2021-07-04 DIAGNOSIS — Z3483 Encounter for supervision of other normal pregnancy, third trimester: Secondary | ICD-10-CM | POA: Insufficient documentation

## 2021-07-04 DIAGNOSIS — Z3A39 39 weeks gestation of pregnancy: Secondary | ICD-10-CM

## 2021-07-04 DIAGNOSIS — O99213 Obesity complicating pregnancy, third trimester: Secondary | ICD-10-CM

## 2021-07-04 DIAGNOSIS — E039 Hypothyroidism, unspecified: Secondary | ICD-10-CM

## 2021-07-04 DIAGNOSIS — Z6791 Unspecified blood type, Rh negative: Secondary | ICD-10-CM

## 2021-07-04 DIAGNOSIS — E669 Obesity, unspecified: Secondary | ICD-10-CM

## 2021-07-04 DIAGNOSIS — O360930 Maternal care for other rhesus isoimmunization, third trimester, not applicable or unspecified: Secondary | ICD-10-CM

## 2021-07-04 DIAGNOSIS — O99891 Other specified diseases and conditions complicating pregnancy: Secondary | ICD-10-CM

## 2021-07-04 DIAGNOSIS — O99283 Endocrine, nutritional and metabolic diseases complicating pregnancy, third trimester: Secondary | ICD-10-CM

## 2021-07-04 NOTE — Progress Notes (Signed)
[name] of [facility]      RETURN OBSTETRICAL ENCOUNTER    PATIENT: [name]  CHART NUMBER: [id number]  DATE OF SERVICE: 07/04/2021    Subjective:  31 y.o. G3P2002 at [redacted]w[redacted]d presenting for [place] visit.     [name] is here for a membrane sweep today.   Denies contractions/cramping. No LOF or VB. She has adequate fetal movement. Denies CP, SOB, HA, vision changes, RUQ pain, N/V, changes in peripheral edema.     Dating Summary    Working EDD: 07/11/2021 set by Rosalio Macadamia, Ambulatory Care Assistant on 11/14/2020 based on Last Menstrual Period on 10/04/2020   Based On EDD GA Diff User Date    Last Menstrual Period on 10/04/2020 07/11/2021 Working Rosalio Macadamia, [place] Assistant 11/14/2020             Objective:  LMP 10/04/2020     General: NAD  Chest: RRR, even non labored breathing on RA  Abdomen: Soft, gravid, + BS, NTTP  Extremities: No edema, no calf tenderness    Fundal height: 38  FHT: 150s         A/P:  31 y.o. G3P2002 at [redacted]w[redacted]d here for routine OB care. Problem list updated as below:     Patient Active Problem List    Diagnosis Date Noted   . Elevated blood pressure affecting pregnancy in third trimester, antepartum 06/27/2021   . Prenatal care, subsequent pregnancy 11/21/2020     Desires CNM care if available -- aware of midwife rotation and MD backup    Psychosocial   Planned pregnancy(?):    Certain LMP(?)    Primary support person:    Work during pregnancy(?):    Baby's sex/name:     Routine labs   Blood type:  A neg   AB screen: neg   H and H at NOB/date: 14.3/40.9 on 11/14/20   Rubella immunity: immune     HIV: neg   Hep B: neg   Hep C: neg   RPR or VDRL: NR     Gonorrhea: neg   Chlamydia: neg     CF carrier status:      Pap smear: normal     Urine culture: 22,000 mixed flora     28 week labs    H&H:     1 ht GTT:     AB screen:    Rhogam (if indicated):     Group B strep culture:     Fetal well being   First trimester screening:   CfDNA:   Quad  screen:    AFP only:       Anatomy US:    Immunizations   Pertussis:    Influenza:     Intrapartum/Postpartum plans   Pain relief:    Feeding: breast              Contraception:        . History of Graves' disease  Hypothyroidism 05/29/2019     S/p ablation in 2018  Follows with Dr. Chrystine Oiler at [facility]  10/15 TSI: 3.9  12/7 TSH: 1.254  06/26/19: 1.14  09/03/19 TSH:  0.970  10/04/19: TSH: 0.878  Current Regimen: Synthroid 112 mcg x 6 days and 168 mcg x 1 day  S/p MFM consult   Plan for growth [place] at 28, 26, and 36 weeks.   Ultrasound 09/26/19 growth in the 73rd percentile   Ultrasound 10/25/2019 growth in the 62nd percentile   Continue weekly testing  with NST and fluid check   Plan for delivery at 39-[redacted] weeks gestation    Will follow above plan for this pregnancy.  AT NOB  Synthroid 112 mcg 6 days, then 168 mcg x 2 for one day    12/17/20:  TSH = 2.542, T4 = 1.06 (continue current dose)     . Obesity  PCOS 05/29/2019     Body mass index is 32.8 kg/m.  Early 1 hour gtt: 144  06/12/2019: 3 hour--92, 153, 130, 99   Hemoglobin A1c: 4.9  09/03/2019:3 hour -- 102, 176, 159, 96.      THIS PREGNANCY:  Early 1 hour GTT:    Two u/s for growth -- 32 and 36 weeks     . Rh negative state in antepartum period 10/28/2013     Prenatal care, subsequent pregnancy  IUP @ [redacted]w[redacted]d   A NEGATIVE  GBS negative    SVE: 3/80/-2    Labor precautions, when to go to L&D and kick counts reviewed    RTC in 1 week(s)    Renaee Munda, APRN, NP-C 07/04/2021, 15:21

## 2021-07-04 NOTE — Assessment & Plan Note (Addendum)
IUP @ [redacted]w[redacted]d   A NEGATIVE  GBS negative    SVE: 3/80/-2    Labor precautions, when to go to L&D and kick counts reviewed

## 2021-07-06 ENCOUNTER — Encounter (HOSPITAL_COMMUNITY): Payer: Self-pay | Admitting: Registered Nurse

## 2021-07-07 ENCOUNTER — Inpatient Hospital Stay
Admission: AD | Admit: 2021-07-07 | Discharge: 2021-07-08 | DRG: 807 | Disposition: A | Discharge status: Home / Self Care | Payer: No Typology Code available for payment source | Attending: Obstetrics & Gynecology | Admitting: Obstetrics & Gynecology

## 2021-07-07 ENCOUNTER — Other Ambulatory Visit: Payer: Self-pay

## 2021-07-07 ENCOUNTER — Encounter (HOSPITAL_COMMUNITY): Payer: Self-pay | Admitting: Obstetrics & Gynecology

## 2021-07-07 ENCOUNTER — Inpatient Hospital Stay (HOSPITAL_COMMUNITY): Payer: No Typology Code available for payment source | Admitting: Obstetrics & Gynecology

## 2021-07-07 DIAGNOSIS — O99214 Obesity complicating childbirth: Secondary | ICD-10-CM

## 2021-07-07 DIAGNOSIS — Z6711 Type A blood, Rh negative: Secondary | ICD-10-CM

## 2021-07-07 DIAGNOSIS — E669 Obesity, unspecified: Secondary | ICD-10-CM

## 2021-07-07 DIAGNOSIS — E282 Polycystic ovarian syndrome: Secondary | ICD-10-CM | POA: Diagnosis present

## 2021-07-07 DIAGNOSIS — E039 Hypothyroidism, unspecified: Secondary | ICD-10-CM | POA: Diagnosis present

## 2021-07-07 DIAGNOSIS — O99284 Endocrine, nutritional and metabolic diseases complicating childbirth: Principal | ICD-10-CM | POA: Diagnosis present

## 2021-07-07 DIAGNOSIS — Z3A39 39 weeks gestation of pregnancy: Secondary | ICD-10-CM

## 2021-07-07 DIAGNOSIS — O26893 Other specified pregnancy related conditions, third trimester: Secondary | ICD-10-CM | POA: Diagnosis present

## 2021-07-07 DIAGNOSIS — Z79890 Hormone replacement therapy (postmenopausal): Secondary | ICD-10-CM

## 2021-07-07 DIAGNOSIS — O3483 Maternal care for other abnormalities of pelvic organs, third trimester: Secondary | ICD-10-CM | POA: Diagnosis present

## 2021-07-07 LAB — DRUG SCREEN, NO CONFIRMATION, URINE
AMPHETAMINES, URINE: NEGATIVE
BARBITURATES URINE: NEGATIVE
BENZODIAZEPINES URINE: NEGATIVE
BUPRENORPHINE URINE: NEGATIVE
CANNABINOIDS URINE: NEGATIVE
COCAINE METABOLITES URINE: NEGATIVE
CREATININE RANDOM URINE: 13 mg/dL — ABNORMAL LOW (ref 50–100)
ECSTASY/MDMA URINE: NEGATIVE
FENTANYL, RANDOM URINE: NEGATIVE
METHADONE URINE: NEGATIVE
OPIATES URINE (LOW CUTOFF): NEGATIVE
OXYCODONE URINE: NEGATIVE

## 2021-07-07 LAB — TYPE AND SCREEN
ABO/RH(D): A NEG
ANTIBODY SCREEN: NEGATIVE

## 2021-07-07 LAB — CBC
HCT: 35.7 % (ref 34.8–46.0)
HGB: 12.7 g/dL (ref 11.5–16.0)
MCH: 30.2 pg (ref 26.0–32.0)
MCHC: 35.6 g/dL — ABNORMAL HIGH (ref 31.0–35.5)
MCV: 85 fL (ref 78.0–100.0)
MPV: 10.5 fL (ref 8.7–12.5)
PLATELETS: 178 10*3/uL (ref 150–400)
RBC: 4.2 10*6/uL (ref 3.85–5.22)
RDW-CV: 14.2 % (ref 11.5–15.5)
WBC: 14.5 10*3/uL — ABNORMAL HIGH (ref 3.7–11.0)

## 2021-07-07 LAB — GOLD TOP TUBE

## 2021-07-07 MED ORDER — PRENATAL VIT-IRON-FOLATE TAB WRAPPER
1.0000 | ORAL_TABLET | Freq: Every day | Status: DC
Start: 2021-07-07 — End: 2021-07-08
  Administered 2021-07-07 – 2021-07-08 (×2): 1 via ORAL
  Filled 2021-07-07 (×2): qty 1

## 2021-07-07 MED ORDER — ACETAMINOPHEN 325 MG TABLET
650.0000 mg | ORAL_TABLET | ORAL | Status: DC | PRN
Start: 2021-07-07 — End: 2021-07-08

## 2021-07-07 MED ORDER — DEXTROSE 5% IN WATER (D5W) FLUSH BAG - 250 ML
INTRAVENOUS | Status: DC | PRN
Start: 2021-07-07 — End: 2021-07-07

## 2021-07-07 MED ORDER — SODIUM CHLORIDE 0.9 % (FLUSH) INJECTION SYRINGE
2.0000 mL | INJECTION | Freq: Three times a day (TID) | INTRAMUSCULAR | Status: DC
Start: 2021-07-07 — End: 2021-07-07
  Administered 2021-07-07: 0 mL

## 2021-07-07 MED ORDER — LEVOTHYROXINE 112 MCG TABLET
112.0000 ug | ORAL_TABLET | ORAL | Status: DC
Start: 2021-07-08 — End: 2021-07-08
  Administered 2021-07-08: 112 ug via ORAL
  Filled 2021-07-07: qty 1

## 2021-07-07 MED ORDER — OXYTOCIN 30 UNIT/500 ML IN 0.9 % SODIUM CHLORIDE INTRAVENOUS
334.0000 m[IU]/min | INTRAVENOUS | Status: AC
Start: 2021-07-07 — End: 2021-07-07
  Administered 2021-07-07: 0 m[IU]/min via INTRAVENOUS
  Administered 2021-07-07 (×2): 95 m[IU]/min via INTRAVENOUS
  Filled 2021-07-07: qty 500

## 2021-07-07 MED ORDER — LEVOTHYROXINE 112 MCG TABLET
224.0000 ug | ORAL_TABLET | ORAL | Status: DC
Start: 2021-07-13 — End: 2021-07-07

## 2021-07-07 MED ORDER — DOCUSATE SODIUM 100 MG CAPSULE
100.0000 mg | ORAL_CAPSULE | Freq: Two times a day (BID) | ORAL | Status: DC | PRN
Start: 2021-07-07 — End: 2021-07-08
  Administered 2021-07-08: 100 mg via ORAL
  Filled 2021-07-07: qty 1

## 2021-07-07 MED ORDER — LEVOTHYROXINE 112 MCG TABLET
224.0000 ug | ORAL_TABLET | ORAL | Status: DC
Start: 2021-07-12 — End: 2021-07-08

## 2021-07-07 MED ORDER — SODIUM CHLORIDE 0.9% FLUSH BAG - 250 ML
INTRAVENOUS | Status: DC | PRN
Start: 2021-07-07 — End: 2021-07-07

## 2021-07-07 MED ORDER — RHO(D) IMMUNE GLOBULIN 1,500 UNIT (300 MCG) INTRAMUSCULAR SYRINGE
300.0000 ug | INJECTION | Freq: Once | INTRAMUSCULAR | Status: AC
Start: 2021-07-07 — End: 2021-07-08
  Administered 2021-07-08: 300 ug via INTRAMUSCULAR

## 2021-07-07 MED ORDER — LIDOCAINE (PF) 20 MG/ML (2 %) INJECTION SOLUTION
10.0000 mL | Freq: Once | INTRAMUSCULAR | Status: DC | PRN
Start: 2021-07-07 — End: 2021-07-07
  Administered 2021-07-07: 200 mg via INTRADERMAL
  Filled 2021-07-07: qty 5

## 2021-07-07 MED ORDER — OXYTOCIN 30 UNIT/500 ML IN 0.9 % SODIUM CHLORIDE INTRAVENOUS
INTRAVENOUS | Status: AC
Start: 2021-07-07 — End: 2021-07-07
  Administered 2021-07-07: 334 m[IU]/min via INTRAVENOUS
  Filled 2021-07-07: qty 500

## 2021-07-07 MED ORDER — IBUPROFEN 600 MG TABLET
600.0000 mg | ORAL_TABLET | Freq: Four times a day (QID) | ORAL | Status: DC
Start: 2021-07-07 — End: 2021-07-08
  Administered 2021-07-07 – 2021-07-08 (×4): 600 mg via ORAL
  Filled 2021-07-07 (×4): qty 1

## 2021-07-07 MED ORDER — SODIUM CHLORIDE 0.9 % (FLUSH) INJECTION SYRINGE
2.0000 mL | INJECTION | INTRAMUSCULAR | Status: DC | PRN
Start: 2021-07-07 — End: 2021-07-08

## 2021-07-07 MED ORDER — GLYCERIN-WITCH HAZEL 12.5 %-50 % TOPICAL PADS
MEDICATED_PAD | CUTANEOUS | Status: DC | PRN
Start: 2021-07-07 — End: 2021-07-08
  Filled 2021-07-07 (×2): qty 40

## 2021-07-07 MED ORDER — LACTATED RINGERS IV BOLUS
500.0000 mL | INJECTION | Status: DC | PRN
Start: 2021-07-07 — End: 2021-07-07

## 2021-07-07 NOTE — Pharmacy (Signed)
Pharmacy Medication Reconciliation    Patient Name: [name], [name]  Date of Service: 07/07/2021  Date of Admission: 07/07/2021  Date of Birth: 1991-02-16  Length of Stay:   0 days     Transitions of Care:  1. Would you like to utilize the [facility]?  No   -  If no, preferred pharmacy:  [place], [place]: [phone]    Information was collected from:  Pharmacy, Caregiver, and Previous [place], [place]: [phone]  Spoke with Marlin husband: Ext - [phone]    Clarified Prior to Admission Medications:  Prior to Admission medications    Medication Sig Taking Resumed Y/N (RPh) Comments   levothyroxine (SYNTHROID) 112 mcg Oral Tablet Take 1 pill daily Monday to Saturday and 1/2 a pill Sunday  Patient taking differently: Take 112 mcg by mouth Monday-Friday, Take 224 mcg by mouth on Saturday and Sunday Yes  No  Filled for 112 mcg Monday-Saturday and 56 mcg on Sunday on 1/27.   Per patients husband, patient takes 12 Monday-Friday and 224 mcg on Saturday and Sunday.   PNV Combo #19-Iron-Fol Ac-DHA 22-6-1-200 mg Oral Capsule Take 1 Cap by mouth Once a day Yes  No         Did patient's home medication list require updates or clarifications? Yes    Medications UPDATED on Prior to Admission Med List:  - Levothyroxine 112 mcg Oral Tablet - 112 mcg Monday-Friday, 224 mcg Saturday and Sunday     Medications ADDED to Prior to Admission Med List:  None     Prior to Admission Medications Being Held and Rationale:  - Levothyroxine  - Prenatal vitamin     Other Medication Discrepancies from Home Medication List:  Per patients husband, patient was taking Tums PRN prior to admission.     Allergies:    No Known Allergies    Bevelyn Ngo, Pharmacy Technician 07/07/21 at 15:03    Did pharmacist make suggestions for medication reconciliation? Yes    Medications REMOVED from home medication list:  N/A    Pharmacist Recommendations:   Recommend to resume home  medications when clinically indicated.    Elmore Guise, PHARMD  07/07/2021, 15:24

## 2021-07-07 NOTE — Nurses Notes (Signed)
Upon 2000 assessment, patient requesting IV removal. Okay to take PIV out per Hyacinth Meeker, CNM

## 2021-07-07 NOTE — H&P (Signed)
[facility]   History and Physical Note      PCP:  No Pcp  Referring Physician:  No ref. provider found    Subjective:      [name] is a 31 y.o. G22P2002 female.  Encounter Start Date:  07/07/2021  Inpatient Admission Date:    Date of Service: 07/07/2021    Chief Complaint:  Katie presents with contractions that have increased in frequency and intensity over the course of the night/morning.  She was able to sleep but felt ctxns intermittently last night.  This morning the ctxns have gone from q 10 minutes to q 3 - 5 minutes.  She has also had an increase in vaginal discharge--thick mucus mixed with some light bleeding.  No loss of fluid.  Baby is active.  Cervix was 3/80/-2 on Friday when her membranes were swept in clinic.  She is hoping NOT to be induced/augmented.  She hopes NOT to have an epidural.    Lab Results   Component Value Date    ABORHD A NEGATIVE 04/18/2021    HGB 12.8 06/27/2021    HCT 36.0 06/27/2021     HIV: neg VDRL:  NR  HH:  As above  Cervical Culture:  GC/CT neg/neg  Urine:    Hep B SAG:  neg  GBS:  -  Rubella:  immune      HPI:  Planned pregnancy with a supportive partner.  This will be their third girl.  Dated by a certain LMP    OB History   Gravida Para Term Preterm AB Living   3 2 2     2    SAB IAB Ectopic Multiple Live Births         0 2      # Outcome Date GA Lbr Len/2nd Weight Sex Delivery Anes PTL Lv   3 Current            2 Term 11/02/19 [redacted]w[redacted]d / 00:07 2.977 kg (6 lb 9 oz) F Vag-Spont None N LIV   1 Term 04/20/14 [redacted]w[redacted]d 21:30 / 00:13 2.58 kg (5 lb 11 oz) F Vag-Spont Local N LIV      Birth Comments: Born at 40 weeks via vaginal delivery. AGA. No prenatal or post natal complications. Breast feeding 20 minutes at a time per feed. Patient tolerating feeds well with regular stooling and multiple wet diapers. Hepatitis B vaccine was administered. Patient passed Hearing screen and O2. Patient was discharged at 36 hours since patient was doing well and  parents were instructed to bring child tomorrow to [facility] for newborn screen. Patient otherwise will follow-up with [facility].     Past Medical History:   Diagnosis Date   . Gestational hypertension 11/01/2019   . Graves disease    . PCOS (polycystic ovarian syndrome)    . Seasonal allergic reaction     occasional OTC meds         Past Surgical History:   Procedure Laterality Date   . HX WISDOM TEETH EXTRACTION  2010         Medications Prior to Admission     Prescriptions    levothyroxine (SYNTHROID) 112 mcg Oral Tablet    Take 1 pill daily Monday to Saturday and 1/2 a pill Sunday    PNV Combo #19-Iron-Fol Ac-DHA 22-6-1-200 mg Oral Capsule    Take 1 Cap by mouth Once a day        No current facility-administered medications  for this encounter.    No Known Allergies  Social History     Tobacco Use   . Smoking status: Never   . Smokeless tobacco: Never   Vaping Use   . Vaping Use: Never used   Substance Use Topics   . Alcohol use: Not Currently     Comment: occasionally once per week   . Drug use: No     Social History     Substance and Sexual Activity   Drug Use No     Family History:   Family Medical History:     Problem Relation (Age of Onset)    Dementia Mother (54)    Eclampsia Sister    Hypertension (High Blood Pressure) Mother    No Known Problems Father    35 Ab Mother, Sister, Sister          Patient's last menstrual period was 10/04/2020.   Estimated Date of Delivery: 2/3/[redacted]     Weeks gestation:  [redacted]w[redacted]d    Other than ROS in the HPI, all other systems were negative.    Labs:  I have reviewed all lab results.     Objective:     Constitutional: moderately obese  Eyes: Pupils equal and round.   ENT: Mouth mucous membranes moist.   Neck: full ROM  Respiratory: breathing easily  Cardiovascular:    well perfused  Gastrointestinal: Soft, non-tender; gravid abdomen  Genitourinary: normal female genitalia  Musculoskeletal: Head atraumatic and normocephalic  Integumentary:  Skin warm and  dry  Neurologic: Grossly normal  Lymphatic/Immunologic/Hematologic: deferred  Psychiatric: Normal affect, behavior, memory, thought content, judgement, and speech.      Fundal height:  Consistent with dates    SVE:  4-5/80/-1; bulging membranes    Bishop Score:  10      Bishop Scoring System   0 1 2 3    Dilatation 0 1 - 2 3 - 4 5 or more   Effacement 0 - 30 40 - 50 60 - 70 80 or more   Station -3 -2 -1,0 +1,+2   Position Posterior Mid Anterior    Consistency Firm Medium Soft        Presentation:  Cephalic    Estimated fetal weight:  3000 gm    Pelvimetry:  average    Fetal heart rate tracing:  FHR with baseline 135; moderate variability; positive accels; one early deceleration    Assessment:     31 y/o G3P2002 at 30 3/7 weeks  Rh negative; Rubella immune; GBS neg  BMI 40  Medical hx significant for PCOS and Graves disease (now well-controlled hypothyroidism)  OB hx significant for prior pregnancy with gestation HTN  Latent labor with cervical change from 3 days ago  Regular ctxns  Reassuring FHR tracing      Plan:     Discussed options:  Admit and augment labor if it stalls or observe for 2 - 3 hours for spontaneous onset of active labor    Pt opts for observation as she would like to avoid as many interventions as possible    If she stays, plan:   Routine labs   Intermittent monitoring with reassuring FHR   Expectant management of labor   Eval for postpartum Rhogam   Cord bloods to include TSH receptor antibody testing ("TRAB" in gold top tube)    Rella Larve, APRN,MIDWIFE      07/07/2021  [name] is becoming more uncomfortable with ctxns  Coping very well  SVE:  7/90/-1; bulging membranes  Will complete admission for labor and precede with expectant management    Rella Larve, APRN,MIDWIFE  07/07/2021, 13:55

## 2021-07-08 ENCOUNTER — Encounter (HOSPITAL_BASED_OUTPATIENT_CLINIC_OR_DEPARTMENT_OTHER): Payer: Self-pay | Admitting: INTERNAL MEDICINE-ENDOCRINOLOGY-DIABETES AND METABOLISM

## 2021-07-08 ENCOUNTER — Encounter (HOSPITAL_BASED_OUTPATIENT_CLINIC_OR_DEPARTMENT_OTHER): Payer: Self-pay

## 2021-07-08 ENCOUNTER — Encounter (INDEPENDENT_AMBULATORY_CARE_PROVIDER_SITE_OTHER): Payer: Self-pay | Admitting: Obstetrics & Gynecology

## 2021-07-08 LAB — RHIG FOR TRANSFUSION

## 2021-07-08 LAB — RHIGG FOR INJECTION

## 2021-07-08 LAB — TEST FOR FETAL CELLS: FETAL SCREEN (ROSETTE TEST): NEGATIVE

## 2021-07-08 MED ORDER — DOCUSATE SODIUM 100 MG CAPSULE
100.0000 mg | ORAL_CAPSULE | Freq: Two times a day (BID) | ORAL | 1 refills | Status: DC | PRN
Start: 2021-07-08 — End: 2022-01-22

## 2021-07-08 MED ORDER — IBUPROFEN 600 MG TABLET
600.0000 mg | ORAL_TABLET | Freq: Four times a day (QID) | ORAL | 1 refills | Status: DC
Start: 2021-07-08 — End: 2022-01-22

## 2021-07-08 NOTE — Care Management Notes (Signed)
[name] Management Initial Evaluation    Patient Name: [name]  Date of Birth: 1990-10-01  Sex: female  Date/Time of Admission: 07/07/2021 10:28 AM  Room/Bed: 805/A  Payor: [place] / Plan: [place] 90/10 / Product Type: PPO /   Primary Care Providers:  Pcp, No (General)    Pharmacy Info:   Preferred [facility] [place] [facility] [address]    [place] [address]    Phone: [phone] Fax: [phone]    Hours: Not open 24 hours          Emergency Contact Info:   Extended Emergency Contact Information  Primary Emergency Contact: [facility]  Address: [address]           [place], [place] [address] [name] of [place] Phone: [phone]  Work Phone: [phone]  Mobile Phone: [phone]  Relation: Husband    History:   [name] is a 31 y.o., female, admitted for SROM    Height/Weight: 167.6 cm (5' 5.98") / 113 kg (249 lb 5.4 oz)     LOS: 1 day   Admitting Diagnosis: Normal labor [O80, Z37.9]    Assessment:      07/08/21 1149   Assessment Details   Assessment Type Admission   Date of Care Management Update 07/08/21   Date of Next DCP Update 07/11/21   Readmission   Is this a readmission? No   Insurance Information/Type   Insurance type Commercial   Employment/Financial   Patient has Prescription Coverage?  Yes        Name of Insurance Coverage for Medications [place] an age group to open "lives with" row.  Adult   Lives With spouse;child(ren), dependent   Living Arrangements house   Able to Return to Prior Arrangements yes   Home Safety   Home Assessment: No Problems Identified   Home Accessibility no concerns   Custody and Legal Status   Do you have a court appointed guardian/conservator? No   Custody Issues? No   Paternity Affidavit Requested? No   Care  Management Plan   Discharge Planning Status initial meeting   Projected Discharge Date 07/08/21   Discharge plan discussed with: Patient;Spouse   Discharge Needs Assessment   Equipment Currently Used at Home none   Equipment Needed After Discharge none   Discharge Facility/Level of Care Needs Home (Patient/Family Member/other)(code 1)   Transportation Available car;family or friend will provide   Referral Information   Admission Type inpatient   Address Verified verified-no changes   Arrived From home or self-care   ADVANCE DIRECTIVES   Does the Patient have an Advance Directive? No, Information Offered and Refused   Patient Requests Assistance in Having Advance Directive Notarized. N/A   LAY CAREGIVER    Appointed Lay Caregiver? I Decline       MSW met with patient and husband/Katie at bedside in Rm 805.  Explained CM role and completed initial assessment.   Placed contact information on patient's whiteboard.   Home Address: [address] [place] [address].  Residing in the home: Patient resides at home with husband/[name] and their two children, [name] and [name].  Working Utilities: Yes.  Reliable Transportation: Yes.  SNAP/WIC: No.  MOB Medical Insurance: [employer].  Infant  Medical Insurance: [employer].  Preferred Home Pharmacy: [facility].  MOB PCP: Does not provide PCP.  Pediatrician: [facility] - Dr Shirlyn Goltz.  Breast Feeding vs Formula Feeding: Breast feeding.  Breast Pump Status: Reports having pump at home.  Has all needed items to adequately care for infant: Yes.  Legal History/Issues: Denied.  CPS History/Current Involvement/Referral: Denied - No.  Substance/Alcohol Abuse: Denied - Negative uds.     Discharge Plan:  Home (Patient/Family Member/other) (code 1)  Patient is now G3P3 female SVD at [redacted]w[redacted]d Delivered viable infant female to be named [name].     No needs anticipated from care management at this time.     The patient will continue to be evaluated for  developing discharge needs.     Case Manager: [place]arleene Cleaver [place] Phone: [phone]

## 2021-07-08 NOTE — Nurses Notes (Signed)
Patient received Rhogam postpartum IM injection in right buttock.

## 2021-07-08 NOTE — Progress Notes (Signed)
[facility]   OB POSTPARTUM PROGRESS NOTE:  CNM      [place], [name], 31 y.o. female  Date of Admission:  07/07/2021  Date of Service: 07/08/2021  Date of Birth:  18-Jun-1990    Hospital Day:  LOS: 1 day     Chief Complaint: PPD#1 s/p SVD over a first degree vaginal laceration    Subjective: Patient has no complaints.  Ambulating without assist or dizziness.  Eating with good appetite.  No issues with urination.  No BM.  Bleeding decreased, not soaking a pad an hour, no clots.  Pain well controlled with meds as written.      Baby Feeding Method: Breastfeeding well    Vital Signs:  Temp (24hrs) Max:37.2 C (99 F)    Temperature: 36.1 C (97 F)  BP (Non-Invasive): 121/76  MAP (Non-Invasive): 93 mmHG  Heart Rate: 94  Respiratory Rate: 18  SpO2: 95 %    Rh: Negative NB positive- rhogam given 07/08/21  Rubella:positive, GBS: negative,     Today's Physical Exam:  General Appearance: Alert, appropriate appearance for age. No acute distress  Heart: appears well perfused, color pink  Lungs: unlabored, easy respirations  Breasts: deferred  Abdomen: soft, nontender  Fundus: firm at 1 below umbilicus  Perineum/Lochia: deferred  LE: 1+ BLE  Mood: appropriate, bonding with newborn evident    Current Medications:  acetaminophen (TYLENOL) tablet, 650 mg, Oral, Q4H PRN  docusate sodium (COLACE) capsule, 100 mg, Oral, 2x/day PRN  ibuprofen (MOTRIN) tablet, 600 mg, Oral, Q6H  levothyroxine (SYNTHROID) tablet, 112 mcg, Oral, Once per day on Mon Tue Wed Thu Fri  [START ON 07/12/2021] levothyroxine (SYNTHROID) tablet, 224 mcg, Oral, Once per day on Sun Sat  NS flush syringe, 2-6 mL, Intracatheter, Q1 MIN PRN  prenatal vitamin-iron-folic acid tablet, 1 Tablet, Oral, Daily  witch hazel-glycerin (TUCKS PADS) 12.5 %-50 % topical pads, , Apply externally, Q1H PRN      Assessment:    PPD #1 s/p SVD with first degree vaginal laceration  Rh negative- NB positive- Rhogam received  Does not desire any hormonal BC  postpartum.    Plan:  Postpartum/discharge teaching completed. Reviewed warning signs including s/s mastitis, increased bleeding, postpartum depression/anxiety.  Support breastfeeding.  Encourage rest.  Anticipate discharge today if continued stable.  Follow up 6 weeks with CNM or RTO sooner prn.    Carleene Cooper, CNM    Attending Attestation    CNM saw and examined the patient independently per their licensed scope of practice.  I was available for immediate consultation, however I did not see or evaluate the patient personally.    Jabier Gauss, MD 07/08/2021 13:51

## 2021-07-08 NOTE — Discharge Instructions (Signed)
Discharge papers gone over with patient, all questions answered. Discharge complete. Patient will room in with baby until baby is discharged.

## 2021-07-08 NOTE — Discharge Summary (Signed)
[facility]  DISCHARGE SUMMARY    PATIENT NAME:  [name], [name]  MRN:  [id number]  DOB:  [birth year]    ENCOUNTER DATE:  07/07/2021  INPATIENT ADMISSION DATE: 07/07/2021  DISCHARGE DATE:  07/08/2021    ATTENDING PHYSICIAN: Vanessa Kick, MD  SERVICE: OBSTETRICS  PRIMARY CARE PHYSICIAN: No Pcp         PRIMARY DISCHARGE DIAGNOSIS: Normal labor  Active Hospital Problems    Diagnosis Date Noted    SVD (spontaneous vaginal delivery) [O80] 07/08/2021      Resolved Hospital Problems    Diagnosis     Principal Problem: Normal labor [O80, Z37.9]      Active Non-Hospital Problems    Diagnosis Date Noted    Prenatal care, subsequent pregnancy 11/21/2020    History of Graves' disease  Hypothyroidism 05/29/2019    Obesity  PCOS 05/29/2019    Rh negative state in antepartum period 10/28/2013        DISCHARGE MEDICATIONS:     Current Discharge Medication List        START taking these medications.        Details   docusate sodium 100 mg Capsule  Commonly known as: COLACE   100 mg, Oral, 2 TIMES DAILY PRN  Qty: 30 Capsule  Refills: 1     Ibuprofen 600 mg Tablet  Commonly known as: MOTRIN   600 mg, Oral, EVERY 6 HOURS  Qty: 60 Tablet  Refills: 1            CONTINUE these medications which have CHANGED during your visit.        Details   levothyroxine 112 mcg Tablet  Commonly known as: SYNTHROID  What changed: additional instructions   Take 1 pill daily Monday to Saturday and 1/2 a pill Sunday  Qty: 78 Tablet  Refills: 1            CONTINUE these medications - NO CHANGES were made during your visit.        Details   PNV Combo #19-Iron-Fol Ac-DHA 22-6-1-200 mg Capsule   1 Capsule, Oral, DAILY  Qty: 30 Cap  Refills: 5            Discharge med list refreshed?  YES     ALLERGIES:  No Known Allergies      REASON FOR HOSPITALIZATION AND HOSPITAL COURSE     BRIEF HPI:  This is a 31 y.o., female admitted for spontaneous labor, contractions every 3-5 mins    BRIEF HOSPITAL NARRATIVE:     [name] presented to L&D on the  morning of 07/07/2021 in early active labor with contractions that had increased in frequency and intensity over night.  She progressed without augmentation to SROM at 1525 and to SVD over a small vaginal laceration at 1602.  Baby girl, "[name]" weighed 3150 gm and had APGARS of 8-9.  Vaginal laceration was repaired under local anesthetic with 3.0 Vicryl.  EBL 250 mL.    [name] was able to breast feed well. Her postpartum course was uncomplicated.  She was discharged on postpartum day 1.  She plans to use natural family planning for contraception at this time.          CONDITION ON DISCHARGE:  A. Ambulation: Full ambulation  B. Self-care Ability: Complete  C. Cognitive Status Alert and Oriented x 3  D. Code status at discharge: Full        DISCHARGE DISPOSITION:  Home discharge      DISCHARGE  INSTRUCTIONS:  Post-Discharge Follow Up Appointments       Follow up with OB/GYN, [facility]    Phone: [phone]    Where: [address] [place] [address]      Tuesday Jul 08, 2021    Return Patient Visit with [name], [facility] at  5:40 PM      Monday Jul 14, 2021    Return Patient Visit with Thea Alken, APRN,NP-C at  5:20 PM      Monday Sep 29, 2021    Return Video Visit with Ardelle Park, MD at 11:20 AM      Endocrinology, [facility], [facility]  [address]  [place] [address]  [phone] OB/GYN, [facility], [place]  [address]  [place] [address]  [phone]             DISCHARGE INSTRUCTION - MISC    Call [phone] and ask for the [facility] physician on call for any of the following symptoms:    --Fever of 100.4 or higher for 2 or more hours  --Red, warm painful area in either breast  --Urgency, frequency, burning, pain on urination or urinating small amounts  --Warm, tender area on either leg  --Purulent discharge from incision or vagina  --Headache that doesn't ease with Tylenol   --Chest  pain or Shortness of breath  --Feelings of depression, irritability, or anxiety      Postpartum Instructions:  --Nothing in the vagina for 6 weeks  --Avoid heaving lifting in the first 2 weeks postpartum (try to keep it to <15lbs). Use good ergonomics and slowly increase.  --You may drive in 5 days if you are no longer taking the pain medication  --Continue taking your prenatal vitamins     FOLLOW-UP: OB/GYN - [place] - [place], [place]     Follow-up in: 6 WEEKS    Reason for visit: HOSPITAL DISCHARGE    Provider: CNM           Timothy Lasso, CNM    Copies sent to Care Team         Relationship Specialty Notifications Start End    Pcp, No PCP - General   05/29/19               Referring providers can utilize [email or web address] to access their referred [facility] patient's information.                    Attending Attestation    CNM saw and examined the patient independently per their licensed scope of practice.  I was available for immediate consultation, however I did not see or evaluate the patient personally.    Nelia Shi, MD 07/08/2021 13:51

## 2021-07-08 NOTE — Nurses Notes (Signed)
Discharge teaching done and all questions answered. Patient stable and ready to go home. Discharge complete.

## 2021-07-08 NOTE — Lactation Note (Signed)
This note was copied from a baby's chart.  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 AGA infant was born via SVD at 56 3/[redacted] weeks gestation .Infant had recently fed and was showing signs of being relaxed. Mother reports that infant has been latching well for her.  She did have to pump for her first child due to no latch. Offered to observe feeding should mother desire and call out.      Weight Loss:3% +83 g      Assessment:       07/08/21 1400   Infant   Breastfeeding Not Observed   Readiness No  (mother reports infant recently fed and was in skin to skin during visit)   Breast Feeding Frequency Every 2-3 hours   Breast Feeding Average Minutes attempts to 15 mins   Supplies Given Lanolin  (HE supplies)   Maternal   Maternal Consult Bedside Consult without observation   Comfort/Pain with Latch Comfortable  (per mom)   Breast Soft;Non-Tender  (per mom)   Nipples Non-Tender  (per mom)   Breast Color WNL   Breastfeeding History   # of Children 3   # of Children Breastfed 3   Any Problems? pumped for first/ latched second   Lactation Plan   Lactation Consultant at Bedside 1-15 minutes         Education Provided:  Benefits of skin to skin for mother and baby  Newborn feeding expectations, encouraged 8-12 feedings within 24/hr period with infant driven cues  Encouraged skin to skin prior to feeding to help with oxytocin release/milk production  Reviewed Lactogenesis II and the supply-demand relationship between stimulation and milk supply  Signs of a deep latch (e.g. jaw wide, lips flared, no pinching)  Benefits of breast massage before and during feeding  Reviewed techniques for a deep latch   lining up nose to nipple   providing sandwich hold to increase breast tissue in baby's mouth   bringing baby to mother and not having mother lean down to baby  Encouraged breast compressions and tactile stimulation while infant at breast to promote active nursing  Discussed nutritive versus  non-nutritive sucking  Discussed Second Night Syndrome and cluster feeding  Discussed proper latch techniques and positioning, including supporting infant neck to allow free motion of the head to elicit gaping response and proper alignment  How to provide stimulation to help wake a sleepy baby (e.g. unswaddling, skin to skin prior to feeding, changing diaper, tactile stimulation while at breast)  Demonstrated and encouraged hand expression - Spoons, med cups, and syringes given to mother  Reviewed signs of hydration  Discussed when to start pumping and frequency for stored collection  Lactation support information discussed with mother/parent and contact number for lactation office provided  [place] OP lactation clinics available if support/guidance needed once discharged - Provided lactation clinic contact information  Written LER breastfeeding educational handouts provided and reviewed (e.g. Five Keys to Successful Breastfeeding, Positioning & Latch, Signs of  Good Feeding, Hand Expression, Breastfeeding Survival Guide for the First Two Weeks, Storage and Handling of Breastmilk)    Plan:  Place infant skin to skin prior to feedings.  Continue to offer breast, encouraged to feed 8-12 times in each 24 hour period with infant driven cues. Place infant skin to skin after 4 hours without any cues.  Use sandwich hold for a deep latch, bring baby to breast with wide open mouth, chin touching breast first  then mouth over nipple, maintain breast support while infant establishes latch.  Encouraged breast compression and tactile stimulation if infant sleepy at breast.  Hand express if infant with poor effort at breast.   While mother still has colostrum, try to present both breasts during each feed.  After milk transitions (usually day 3 - day 5), leave baby on one breast until it is empty before switching baby to other breast. If baby finishes after one breast, start with the other breast for the next feed.   Encouraged to ask  RN for assistance with latching or hand expression.  Will follow with dyad.    Konrad Felix ADN, RN, IBCLC  Lactation Consultant  [facility]   In [facility]: [phone]

## 2021-07-09 ENCOUNTER — Telehealth (HOSPITAL_COMMUNITY): Payer: Self-pay

## 2021-07-09 LAB — RHIG FOR TRANSFUSION: UNIT DIVISION: 0

## 2021-07-09 NOTE — Telephone Encounter (Signed)
Transition of Care Contact Information  Discharge Date: 07/08/2021  Transition Facility Type--Hospital (Inpatient or Observation)  [place]): Completed or attempted contact indicated by Date/Time  First Attempt Call: 07/09/2021 11:49 AM  Second Attempted Contact: 07/09/2021 11:50 AM  Contact Method(s)-- Patient/Caregiver Telephone, MyChart Patient Portal  Clinical Staff Name/Role who contacted--Oday Ridings Nancie Neas  Transition Note: Unable to reach patient via telephone. MyChart message sent.      Further information may be documented in relevant telephone or outreach encounter.

## 2021-07-14 ENCOUNTER — Other Ambulatory Visit (HOSPITAL_BASED_OUTPATIENT_CLINIC_OR_DEPARTMENT_OTHER): Payer: Self-pay | Admitting: Student in an Organized Health Care Education/Training Program

## 2021-07-14 ENCOUNTER — Ambulatory Visit (HOSPITAL_BASED_OUTPATIENT_CLINIC_OR_DEPARTMENT_OTHER): Payer: Self-pay | Admitting: Family

## 2021-07-14 ENCOUNTER — Encounter (HOSPITAL_BASED_OUTPATIENT_CLINIC_OR_DEPARTMENT_OTHER): Payer: Self-pay | Admitting: Family

## 2021-07-15 ENCOUNTER — Encounter (HOSPITAL_BASED_OUTPATIENT_CLINIC_OR_DEPARTMENT_OTHER): Payer: Self-pay

## 2021-07-25 ENCOUNTER — Ambulatory Visit (HOSPITAL_BASED_OUTPATIENT_CLINIC_OR_DEPARTMENT_OTHER): Payer: Self-pay

## 2021-07-29 ENCOUNTER — Ambulatory Visit (HOSPITAL_BASED_OUTPATIENT_CLINIC_OR_DEPARTMENT_OTHER): Payer: Self-pay

## 2021-08-14 ENCOUNTER — Other Ambulatory Visit (HOSPITAL_BASED_OUTPATIENT_CLINIC_OR_DEPARTMENT_OTHER): Payer: BC Managed Care – PPO | Admitting: Gynecology

## 2021-08-14 ENCOUNTER — Other Ambulatory Visit: Payer: Self-pay

## 2021-08-14 ENCOUNTER — Encounter (HOSPITAL_BASED_OUTPATIENT_CLINIC_OR_DEPARTMENT_OTHER): Payer: Self-pay

## 2021-08-14 ENCOUNTER — Ambulatory Visit: Payer: BC Managed Care – PPO

## 2021-08-14 DIAGNOSIS — E039 Hypothyroidism, unspecified: Secondary | ICD-10-CM | POA: Insufficient documentation

## 2021-08-14 DIAGNOSIS — Z8639 Personal history of other endocrine, nutritional and metabolic disease: Secondary | ICD-10-CM | POA: Insufficient documentation

## 2021-08-14 LAB — THYROXINE, FREE (FREE T4): THYROXINE (T4), FREE: 1.22 ng/dL (ref 0.70–1.25)

## 2021-08-14 LAB — THYROID STIMULATING HORMONE WITH FREE T4 REFLEX: TSH: 0.032 u[IU]/mL — ABNORMAL LOW (ref 0.430–3.550)

## 2021-08-14 LAB — THYROID STIMULATING HORMONE (SENSITIVE TSH): TSH: 0.032 u[IU]/mL — ABNORMAL LOW (ref 0.430–3.550)

## 2021-08-14 NOTE — Progress Notes (Signed)
[place] Department of Obstetrics & Gynecology      Postpartum Visit    PATIENT:  [name]   MRN:  [id number]   DATE OF SERVICE:  08/14/2021, 14:16    Subjective:  31 y.o. [id number] presenting for postpartum visit. She is s/p SVD on 07/07/2021. Today, she is doing well with no issues. Her bleeding has slowed down significantly. She is having no issues breast feeding the baby. Her mood is currently stable. She has no other complaints at this time.    Objective:  Vitals:    08/14/21 1352   BP: 130/84   Weight: 106 kg (233 lb 4 oz)   Height: 1.651 m (5\' 5" )   BMI: 38.9     GEN:  NAD, well-appearing  CV:  RRR, no M/R/G  RESP:  CTAB  ABD:  Soft, NTTP  EXT:  No edema, no calf tenderness  Weight: 106 kg (233 lb 4 oz)  BP (Non-Invasive): 130/84    A/P:  31 y.o. 38 s/p SVD on 1/30 presenting for postpartum visit    Patient Active Problem List    Diagnosis Date Noted   . SVD (spontaneous vaginal delivery) 07/08/2021     Fionna presented to L&D on the morning of 07/07/2020 in early active labor with contractions that had increased in frequency and intensity over night.  She progressed without augmentation to SROM at 1525 and to SVD over a small vaginal laceration at 1602.  Baby girl, "[name]" weighed 3150 gm and had APGARS of 8-9.  Vaginal laceration was repaired under local anesthetic with 3.0 Vicryl.  EBL 250 mL.    Keigan was able to breast feed ___. Her postpartum course was ___.  She was discharged on postpartum day ___.  She planned to use ___ for contraception.       Maralyn Sago History of Graves' disease  Hypothyroidism 05/29/2019     S/p ablation in 2018  Follows with Dr. 2019 at [facility]  10/15 TSI: 3.9  12/7 TSH: 1.254  06/26/19: 1.14  09/03/19 TSH:  0.970  10/04/19: TSH: 0.878  Current Regimen: Synthroid 112 mcg x 6 days and 168 mcg x 1 day  S/p MFM consult   Plan for growth 10/06/19 at 28, 32, and 36 weeks.   Ultrasound 09/26/19 growth in the 73rd percentile   Ultrasound 10/25/2019 growth in the 62nd  percentile   Continue weekly testing with NST and fluid check   Plan for delivery at 39-[redacted] weeks gestation    Will follow above plan for this pregnancy.  AT NOB  Synthroid 112 mcg 6 days, then 168 mcg x 2 for one day    12/17/20:  TSH = 2.542, T4 = 1.06 (continue current dose)     . Obesity  PCOS 05/29/2019     Body mass index is 32.8 kg/m.  Early 1 hour gtt: 144  06/12/2019: 3 hour--92, 153, 130, 99   Hemoglobin A1c: 4.9  09/03/2019:3 hour -- 102, 176, 159, 96.      THIS PREGNANCY:  Early 1 hour GTT:    Two u/s for growth -- 32 and 36 weeks       1. 6 wk post partum  Doing well    2. Contraception Management  NFP    3. GYN Care  Pap 11/14/2020  Due 11/14/2025    4. Wellness  HPV complete  Flu shot UTD  Covid x2     RTC in 1 year for annual     Floyd  Gaspar Fowle, APRN, NP-C 08/14/2021, 13:39

## 2021-08-15 ENCOUNTER — Encounter (HOSPITAL_BASED_OUTPATIENT_CLINIC_OR_DEPARTMENT_OTHER): Payer: Self-pay

## 2021-08-16 ENCOUNTER — Encounter (HOSPITAL_BASED_OUTPATIENT_CLINIC_OR_DEPARTMENT_OTHER): Payer: Self-pay | Admitting: INTERNAL MEDICINE-ENDOCRINOLOGY-DIABETES AND METABOLISM

## 2021-08-21 ENCOUNTER — Encounter (HOSPITAL_COMMUNITY): Payer: Self-pay | Admitting: Obstetrics & Gynecology

## 2021-09-29 ENCOUNTER — Other Ambulatory Visit: Payer: Self-pay

## 2021-09-29 ENCOUNTER — Ambulatory Visit: Payer: Self-pay | Attending: Blood Banking & Transfusion Medicine

## 2021-09-29 ENCOUNTER — Ambulatory Visit
Payer: BC Managed Care – PPO | Attending: INTERNAL MEDICINE-ENDOCRINOLOGY-DIABETES AND METABOLISM | Admitting: INTERNAL MEDICINE-ENDOCRINOLOGY-DIABETES AND METABOLISM

## 2021-09-29 DIAGNOSIS — E039 Hypothyroidism, unspecified: Secondary | ICD-10-CM | POA: Insufficient documentation

## 2021-09-29 DIAGNOSIS — E890 Postprocedural hypothyroidism: Secondary | ICD-10-CM

## 2021-09-29 LAB — THYROID STIMULATING HORMONE (SENSITIVE TSH): TSH: 0.313 u[IU]/mL — ABNORMAL LOW (ref 0.430–3.550)

## 2021-09-29 NOTE — Progress Notes (Signed)
MEDICINE  Virtual visit      Name: [name]  MRN: [id number]  DOB: Feb 20, 1991    Date of Service:  09/29/2021    Last visit in August 2021    TELEMEDICINE DOCUMENTATION:  Patient Location:  MyChart video visit from home address: [address]  [facility] [address]   Patient/family aware of provider location:  yes  Patient/family consent for telemedicine:  yes  Examination observed and performed by:  Ardelle Park, MD    Problem List:     1.  Post radioactive iodine ablation hypothyroidism. (December 2018)  2.  Delivered healthy baby girl on July 07, 2021. ( vaginal delivery)        Breastfeeding.     Last Plan:     Dose reduced after delivery to 112 mcg of levothyroxine daily.     Review:     Patient reports she delivered a baby girl on July 07, 2021.  Pregnancy and delivery was uneventful.   Breastfeeding since delivery.  Almost 16 lb weight loss following delivery noted.  Patient has not resumed her menstrual cycles.   Dose of levothyroxine was reduced about 4 weeks ago to 112 mcg daily.   Taking medication correctly. Taking prenatal vitamins in the evenings    Currently she denies any palpitations, tremors, irritability.  Able to sleep most nights uninterrupted.   Denies problems with constipation or diarrhea.        THYROID STIMULATING HORMONE  Lab Results   Component Value Date    TSH 0.032 (L) 08/14/2021    TSH 0.032 (L) 08/14/2021       History of Present Illness:      [name] is a 31 y.o. female who presents today for follow up of post radioactive iodine ablation hypothyroidism. Patient is currently [redacted] weeks pregnant with expected due date of June 16th. Reports that recent pregnancy scan was good. She will have weekly monitoring starting on week 32 and at least 3 more scans. She is still taking 112 mcg of levothyroxine 1 pill x6 days and 1.5 pills on Sunday. Taking medication in the morning on an empty stomach. Taking prenatals at night. Patient is planning  to breastfeed. She is not planning to start contraceptive immediately after delivery. Reports that she's feeling okay with occasional morning sickness.     Patient Active Problem List    Diagnosis   . SVD (spontaneous vaginal delivery)   . History of Graves' disease  Hypothyroidism   . Obesity  PCOS     Past Medical History:   Diagnosis Date   . Gestational hypertension 11/01/2019   . Graves disease    . PCOS (polycystic ovarian syndrome)    . Seasonal allergic reaction     occasional OTC meds         Past Surgical History:   Procedure Laterality Date   . HX WISDOM TEETH EXTRACTION  2010         Current Outpatient Medications   Medication Sig   . docusate sodium (COLACE) 100 mg Oral Capsule Take 1 Capsule (100 mg total) by mouth Twice per day as needed for Constipation   . Ibuprofen (MOTRIN) 600 mg Oral Tablet Take 1 Tablet (600 mg total) by mouth Every 6 hours   . levothyroxine (SYNTHROID) 112 mcg Oral Tablet Take 1 pill daily Monday to Saturday and 1/2 a pill Sunday (Patient taking differently: Take 112 mcg by mouth Monday-Friday, Take 224 mcg by mouth on Saturday  and Sunday)   . PNV Combo #19-Iron-Fol Ac-DHA 22-6-1-200 mg Oral Capsule Take 1 Cap by mouth Once a day     No Known Allergies  Family Medical History:     Problem Relation (Age of Onset)    Dementia Mother (64)    Eclampsia Sister    Hypertension (High Blood Pressure) Mother    No Known Problems Father    Spont Ab Mother, Sister, Sister            Social History     Tobacco Use   . Smoking status: Never   . Smokeless tobacco: Never   Substance Use Topics   . Alcohol use: Not Currently     Comment: occasionally once per week       Review of Systems:  Constitutional: negative   Denies tachycardia, tremors, weight loss.   Denies heat or cold intolerance.   Reports hair thinning and hair loss in the last 6 months.   Denies diarrhea.     Physical Exam:    Physical exam not because this was a virtual visit.     Assessment/Plan:    1. Post RAI ablation  hypothyroidism.     Delivered healthy baby in January 2023. Vaginal delivery. Daughter weighed 6 lbs.  Breastfeeding   Dose reduced after delivery to 112 mcg 7 days.  Taking medication in the morning on empty stomach, denies missing any pills  Repeat TSH tomorrow and again in three months.    Patient is currently breastfeeding and is not planning on starting any hormonal contraception.     Patient Education:  Serial TSH levels needed to adjust the dose, if she decides to start a birth control pill then update the clinic.  Oral estrogen in the birth control pill will likely was an increase in the levothyroxine dose.     Standing order of TSH placed.     Return to clinic in 12 months.     Ardelle Park, MD     Associate  Professor, Endocrinology.    [facility] Medicine  [phone]

## 2021-11-13 ENCOUNTER — Other Ambulatory Visit (HOSPITAL_BASED_OUTPATIENT_CLINIC_OR_DEPARTMENT_OTHER): Payer: Self-pay | Admitting: INTERNAL MEDICINE-ENDOCRINOLOGY-DIABETES AND METABOLISM

## 2021-11-14 MED ORDER — LEVOTHYROXINE 112 MCG TABLET
ORAL_TABLET | ORAL | 2 refills | Status: DC
Start: 2021-11-14 — End: 2022-05-07

## 2021-11-14 NOTE — Telephone Encounter (Signed)
Pended to the provider.     Alycen Mack, RN

## 2021-11-25 ENCOUNTER — Encounter (HOSPITAL_BASED_OUTPATIENT_CLINIC_OR_DEPARTMENT_OTHER): Payer: Self-pay | Admitting: CERTIFIED NURSE MIDWIFE

## 2021-11-25 ENCOUNTER — Encounter (HOSPITAL_BASED_OUTPATIENT_CLINIC_OR_DEPARTMENT_OTHER): Payer: Self-pay

## 2021-12-11 ENCOUNTER — Ambulatory Visit (HOSPITAL_BASED_OUTPATIENT_CLINIC_OR_DEPARTMENT_OTHER): Payer: BC Managed Care – PPO | Admitting: Family

## 2021-12-22 ENCOUNTER — Ambulatory Visit (HOSPITAL_BASED_OUTPATIENT_CLINIC_OR_DEPARTMENT_OTHER): Payer: BC Managed Care – PPO | Admitting: Family

## 2022-01-07 ENCOUNTER — Ambulatory Visit: Payer: Self-pay | Attending: Blood Banking & Transfusion Medicine

## 2022-01-07 DIAGNOSIS — Z0000 Encounter for general adult medical examination without abnormal findings: Secondary | ICD-10-CM

## 2022-01-07 LAB — THYROID STIMULATING HORMONE (SENSITIVE TSH): TSH: 0.953 u[IU]/mL (ref 0.430–3.550)

## 2022-01-22 ENCOUNTER — Other Ambulatory Visit: Payer: Self-pay

## 2022-01-22 ENCOUNTER — Encounter (HOSPITAL_BASED_OUTPATIENT_CLINIC_OR_DEPARTMENT_OTHER): Payer: Self-pay | Admitting: Family

## 2022-01-22 ENCOUNTER — Telehealth (HOSPITAL_BASED_OUTPATIENT_CLINIC_OR_DEPARTMENT_OTHER): Payer: Self-pay

## 2022-01-22 ENCOUNTER — Ambulatory Visit: Payer: BC Managed Care – PPO | Attending: Family | Admitting: Family

## 2022-01-22 VITALS — BP 126/74 | HR 96 | Temp 98.7°F | Wt 239.6 lb

## 2022-01-22 DIAGNOSIS — Z6839 Body mass index (BMI) 39.0-39.9, adult: Secondary | ICD-10-CM

## 2022-01-22 DIAGNOSIS — Z131 Encounter for screening for diabetes mellitus: Secondary | ICD-10-CM | POA: Insufficient documentation

## 2022-01-22 DIAGNOSIS — Z8639 Personal history of other endocrine, nutritional and metabolic disease: Secondary | ICD-10-CM | POA: Insufficient documentation

## 2022-01-22 DIAGNOSIS — Z0000 Encounter for general adult medical examination without abnormal findings: Secondary | ICD-10-CM | POA: Insufficient documentation

## 2022-01-22 DIAGNOSIS — Z13220 Encounter for screening for lipoid disorders: Secondary | ICD-10-CM | POA: Insufficient documentation

## 2022-01-22 DIAGNOSIS — E669 Obesity, unspecified: Secondary | ICD-10-CM | POA: Insufficient documentation

## 2022-01-22 NOTE — Progress Notes (Signed)
CLINIC HISTORY AND PHYSICAL  FAMILY MEDICINE, [facility]  [place] [address]    [name]  MRN: [id number] DOB: 1990-10-11  Date of Service: 01/22/2022  PCP: Jorene Minors, APRN,NP-C     CHIEF COMPLAINT  Chief Complaint   Patient presents with    Annual Wellness Exam       Assessment & Plan:   [name] is a 31 y.o. female, here today for:     - Age appropriate screening completed/ordered today. See above for details.  - Patient counseled today about the following preventive/treatment guideline for their age: safe sex practices, smoking/tobacco use/cessation, partner violence, diet, exercise, healthy body weight.  - Recommend a minimum of thirty minutes of moderate exercise (i.e. walking at a brisk pace, bicycling, etc.) 4-5 days per week for heart health and to assist in healthy metabolism.    1. Annual physical exam  - see above  - GLUCOSE FASTING; Future  - LIPID PANEL; Future    2. Screening for diabetes mellitus  - GLUCOSE FASTING; Future    3. Screening for lipid disorders  - LIPID PANEL; Future    4. History of Graves' disease  Hypothyroidism  Chronic, controlled, stable. Continue medications below as prescribed, no changes required, refilled as appropriate.   - follows with endocrinology for synthroid and labs  - labs WNL    5. Obesity  PCOS  Chronic, controlled, stable. Continue medications below as prescribed, no changes required, refilled as appropriate.   - BMI addressed: Advised on diet, weight loss, and exercise to reduce above normal BMI.  - Refer to Dietician-Family Medicine; Future           Return in about 1 year (around 01/23/2023) for wellness 12mn appt, chronic medical conditions.    HPI  SCamri MolloyAlexandropoulos is a 31y.o. female who presents to clinic for the above chief complaint. Has no concerns about their body or health today. Eating and drinking well. Feels well and at their baseline. No red flag symptoms. See  ROS for additional details.    Review of Systems   Constitutional:  Negative for appetite change, chills and fever.   HENT:  Negative for ear pain and trouble swallowing.    Respiratory:  Negative for cough, chest tightness, shortness of breath and wheezing.    Cardiovascular:  Negative for chest pain and leg swelling.   Gastrointestinal:  Negative for abdominal pain, blood in stool, constipation, diarrhea, nausea and vomiting.   Neurological:  Negative for dizziness, speech difficulty, weakness and numbness.   Psychiatric/Behavioral:  Negative for confusion.      Active Problem List:  Patient Active Problem List   Diagnosis    History of Graves' disease  Hypothyroidism    Obesity  PCOS     Past Medical Hx:  Past Medical History:   Diagnosis Date    Gestational hypertension 11/01/2019    Graves disease     PCOS (polycystic ovarian syndrome)     Seasonal allergic reaction     occasional OTC meds    SVD (spontaneous vaginal delivery) 07/08/2021    Shiloh presented to L&D on the morning of 07/07/2020 in early active labor with contractions that had increased in frequency and intensity over night.  She progressed without augmentation to SROM at 1525 and to SVD over a small vaginal laceration at 1602.  Baby girl, "[name]" weighed 3150 gm and had APGARS of 8-9.  Vaginal laceration was repaired under local  anesthetic with 3.0 Vicryl.  EBL 250 mL.           Past Surgical Hx:  Past Surgical History:   Procedure Laterality Date    HX WISDOM TEETH EXTRACTION  2010     Family Hx:  Family Medical History:       Problem Relation (Age of Onset)    Dementia Mother (76)    Eclampsia Sister    Hypertension (High Blood Pressure) Mother    No Known Problems Father    Spont Ab Mother, Sister, Sister            Past Social Hx:  Social History     Tobacco Use    Smoking status: Never    Smokeless tobacco: Never   Substance Use Topics    Alcohol use: Not Currently     E-CigaretteVaping History:  E-Cigarette/Vaping Use: Never  User      Substances vaped:      Additional information:    Medications:  Current Outpatient Medications   Medication Sig    levothyroxine (SYNTHROID) 112 mcg Oral Tablet Take 1 pill daily Monday to Saturday and 1/2 a pill Sunday    PNV Combo #19-Iron-Fol Ac-DHA 22-6-1-200 mg Oral Capsule Take 1 Cap by mouth Once a day       Allergies:  No Known Allergies    OBJECTIVE    Weight: 109 kg (239 lb 10.2 oz)  Vitals:    01/22/22 1504   BP: 126/74   Pulse: 96   Temp: 37.1 C (98.7 F)   Weight: 109 kg (239 lb 10.2 oz)     Weight/BMI:  Wt Readings from Last 5 Encounters:   01/22/22 109 kg (239 lb 10.2 oz)   08/14/21 106 kg (233 lb 4 oz)   07/07/21 113 kg (249 lb 5.4 oz)   07/03/21 113 kg (249 lb 5.4 oz)   07/01/21 114 kg (251 lb 1.7 oz)      BP Readings from Last 5 Encounters:   01/22/22 126/74   08/14/21 130/84   07/08/21 120/80   07/03/21 126/82   07/01/21 130/84       Physical Exam  Vitals reviewed.   Constitutional:       General: She is not in acute distress.     Appearance: Normal appearance. She is not toxic-appearing.   HENT:      Head: Normocephalic and atraumatic.      Nose: Nose normal.   Eyes:      Conjunctiva/sclera: Conjunctivae normal.      Pupils: Pupils are equal, round, and reactive to light.   Cardiovascular:      Rate and Rhythm: Normal rate and regular rhythm.      Pulses: Normal pulses.      Heart sounds: Normal heart sounds.   Pulmonary:      Effort: Pulmonary effort is normal. No respiratory distress.      Breath sounds: Normal breath sounds.   Abdominal:      Palpations: Abdomen is soft.   Musculoskeletal:         General: Normal range of motion.      Cervical back: Normal range of motion and neck supple. No tenderness.      Right lower leg: No edema.      Left lower leg: No edema.   Skin:     General: Skin is warm and dry.   Neurological:      General: No focal deficit present.  Mental Status: She is alert and oriented to person, place, and time. Mental status is at baseline.   Psychiatric:          Attention and Perception: Attention and perception normal.         Mood and Affect: Mood and affect normal.         Speech: Speech normal.         Behavior: Behavior normal. Behavior is cooperative.         Thought Content: Thought content normal.         Cognition and Memory: Cognition and memory normal.         Judgment: Judgment normal.       Data Reviewed:  Last CBC  (Last result in the past 2 years)        WBC   HGB   HCT   MCV   Platelets      07/07/21 1406 14.5   12.7   35.7   85.0   178            Last BMP  (Last result in the past 2 years)        Na   K   Cl   CO2   BUN   Cr   Calcium   Glucose   Glucose-Fasting        06/27/21 1606           0.64                   Last Hepatic Panel  (Last result in the past 2 years)        Albumin   Total PTN   Total Bili   Direct Bili   Ast/SGOT   Alt/SGPT   Alk Phos        06/27/21 1606         11           06/27/21 1606           7               Lab A1C Results:  HEMOGLOBIN A1C   Date Value Ref Range Status   06/12/2019 4.9 4.0 - 5.6 % Final       POCT A1C Results:       No data to display                No results found for: POCTHA1C  No results found for: HA1CPOC    Lab Results   Component Value Date    TRIG 69 05/26/2013    HDLCHOL 57 05/26/2013    LDLCHOL 125 (H) 05/26/2013    CHOLESTEROL 196 05/26/2013     The ASCVD Risk score (Arnett DK, et al., 2019) failed to calculate for the following reasons:    The 2019 ASCVD risk score is only valid for ages 78 to 20    Depression screening  PHQ Questionnaire  Little interest or pleasure in doing things.: Not at all  Feeling down, depressed, or hopeless: Not at all  PHQ 2 Total: 0  Trouble falling or staying asleep, or sleeping too much.: Not at all  Feeling tired or having little energy: Not at all  Poor appetite or overeating: Not at all  Feeling bad about yourself/ that you are a failure in the past 2 weeks?: Not at all  Trouble concentrating on things in the past 2 weeks?: Not at all  Moving/Speaking slowly or  being  fidgety or restless  in the past 2 weeks?: Not at all  Thoughts that you would be better off DEAD, or of hurting yourself in some way.: Not at all  If you checked off any problems, how difficult have these problems made it for you to do your work, take care of things at home, or get along with other people?: Not difficult at all  PHQ 9 Total: 0  Interpretation of Total Score: 0-4 No depression  Depression Screening is negative.      reports that she has never smoked. She has never used smokeless tobacco. She reports that she does not currently use alcohol. She reports that she does not use drugs.  -Occupation: document processor; enjoys work.  -Alcohol Misuse: No  Body mass index is 39.88 kg/m.  -Diet History: mostly healthy  -Exercise History: walking/chores/cycling, 8mns, varies times per week  -Sleep: 7-8 hours a night.   -Have you been told you snore or stop breathing at nighttime? No  -STI Screening-Any known exposure/risk to STI's: No  -Routine Eye Exams: Yes  -Routine Dental Exams: No    Cancer Screening in Female:   -Cervical cancer (every 3 yrs if negative age 31-610yrPAP only or every 5 yrs age 773-65ith cotesting, >65 d/c if past 10 years negative): Up to date, not currently due and PER GYN provider. release for records completed as needed Hx of abnormal pap smears: no.   -Annual / biannual Mammography: Beginning at age 4734N/A.   -Colorectal cancer: Beginning at age 475Patient less than 4579rs old without other indications for colon cancer screening.  -Lung Cancer >50 yr age screen if 2040ack yr current or former quit in past 1545rs: N/A   -Osteoporosis (6(24nd older or postmenopausal <65 with risk factor): N/A   -Vit D testing (if community dwelling or high fall risk) No     Health Maintenance   Topic Date Due    Influenza Vaccine (1) 02/06/2022    Depression Screening  01/23/2023    Pap smear  11/14/2025    Adult Tdap-Td (3 - Td or Tdap) 04/19/2031    HIV Screening  Completed     Meningococcal Vaccine  Aged Out    Pneumococcal Vaccine, Age 71-64  Aged Out    Hepatitis B Vaccine  Discontinued    Covid-19 Vaccine  Discontinued    TDAP during pregnancy  Discontinued        Lab Results   Component Value Date    UROBILINOGEN 0.2 11/01/2019           This document was generated using a voice recognition system and transcription. All documents are proofed as best as possible, but it may have misspelled words, incorrect words, or syntax and grammatical errors because of the imperfect nature of the system.    Dr. KuJorene MinorsDNMaunawiliFNP-C    [facility]MoRexford[place] [phone]

## 2022-01-26 ENCOUNTER — Ambulatory Visit (INDEPENDENT_AMBULATORY_CARE_PROVIDER_SITE_OTHER): Payer: Self-pay | Admitting: Registered"

## 2022-02-03 ENCOUNTER — Encounter (HOSPITAL_COMMUNITY): Payer: Self-pay

## 2022-04-17 ENCOUNTER — Other Ambulatory Visit: Payer: Self-pay

## 2022-04-17 ENCOUNTER — Encounter (HOSPITAL_BASED_OUTPATIENT_CLINIC_OR_DEPARTMENT_OTHER): Payer: Self-pay

## 2022-04-17 ENCOUNTER — Ambulatory Visit: Payer: Self-pay | Attending: Blood Banking & Transfusion Medicine

## 2022-04-17 ENCOUNTER — Encounter (HOSPITAL_BASED_OUTPATIENT_CLINIC_OR_DEPARTMENT_OTHER): Payer: Self-pay | Admitting: Family

## 2022-04-17 DIAGNOSIS — Z0000 Encounter for general adult medical examination without abnormal findings: Secondary | ICD-10-CM

## 2022-04-17 LAB — THYROID STIMULATING HORMONE (SENSITIVE TSH): TSH: 0.772 u[IU]/mL (ref 0.350–4.940)

## 2022-04-17 LAB — HCG, PLASMA OR SERUM QUANTITATIVE, PREGNANCY: HCG QUANTITATIVE PREGNANCY: <2 m[IU]/mL (ref <5)

## 2022-04-20 ENCOUNTER — Encounter (HOSPITAL_BASED_OUTPATIENT_CLINIC_OR_DEPARTMENT_OTHER): Payer: Self-pay | Admitting: CERTIFIED NURSE MIDWIFE

## 2022-04-24 ENCOUNTER — Other Ambulatory Visit (HOSPITAL_COMMUNITY): Payer: Self-pay | Admitting: CERTIFIED NURSE MIDWIFE

## 2022-04-24 DIAGNOSIS — E282 Polycystic ovarian syndrome: Secondary | ICD-10-CM

## 2022-04-24 DIAGNOSIS — Z6839 Body mass index (BMI) 39.0-39.9, adult: Secondary | ICD-10-CM

## 2022-05-06 ENCOUNTER — Encounter (HOSPITAL_BASED_OUTPATIENT_CLINIC_OR_DEPARTMENT_OTHER): Payer: Self-pay | Admitting: INTERNAL MEDICINE-ENDOCRINOLOGY-DIABETES AND METABOLISM

## 2022-05-07 MED ORDER — LEVOTHYROXINE 112 MCG TABLET
ORAL_TABLET | ORAL | 3 refills | Status: DC
Start: 2022-05-07 — End: 2022-12-21

## 2022-05-07 NOTE — Telephone Encounter (Signed)
From: Malachi Carl Faircloth  To: Rose Phi  Sent: 05/06/2022 12:59 PM EST  Subject: Dosage change for refills    Hello, my prescription still says 6 full pills and 1 half fill, which is making the refill only for 78 pills. Can this be updated and resent to the pharmacy as the 7 full pills, 90 day supply?

## 2022-06-10 ENCOUNTER — Other Ambulatory Visit: Payer: Self-pay

## 2022-06-10 ENCOUNTER — Other Ambulatory Visit
Payer: BC Managed Care – PPO | Attending: INTERNAL MEDICINE-ENDOCRINOLOGY-DIABETES AND METABOLISM | Admitting: Gynecology

## 2022-06-10 DIAGNOSIS — Z13220 Encounter for screening for lipoid disorders: Secondary | ICD-10-CM | POA: Insufficient documentation

## 2022-06-10 DIAGNOSIS — Z0000 Encounter for general adult medical examination without abnormal findings: Secondary | ICD-10-CM | POA: Insufficient documentation

## 2022-06-10 DIAGNOSIS — E282 Polycystic ovarian syndrome: Secondary | ICD-10-CM | POA: Insufficient documentation

## 2022-06-10 DIAGNOSIS — E039 Hypothyroidism, unspecified: Secondary | ICD-10-CM | POA: Insufficient documentation

## 2022-06-10 DIAGNOSIS — Z131 Encounter for screening for diabetes mellitus: Secondary | ICD-10-CM | POA: Insufficient documentation

## 2022-06-10 LAB — LIPID PANEL
CHOL/HDL RATIO: 4.5
CHOLESTEROL: 247 mg/dL — ABNORMAL HIGH (ref 100–200)
HDL CHOL: 55 mg/dL (ref >=50)
LDL CALC: 171 mg/dL — ABNORMAL HIGH (ref <100)
NON-HDL: 192 mg/dL — ABNORMAL HIGH (ref <=190)
TRIGLYCERIDES: 119 mg/dL (ref <150)
VLDL CALC: 23 mg/dL (ref <30)

## 2022-06-10 LAB — PROLACTIN: PROLACTIN: 15.4 ng/mL (ref 5.2–26.5)

## 2022-06-10 LAB — GLUCOSE FASTING: GLUCOSE FASTING: 103 mg/dL — ABNORMAL HIGH (ref 70–99)

## 2022-06-10 LAB — THYROID STIMULATING HORMONE (SENSITIVE TSH): TSH: 1.458 u[IU]/mL (ref 0.350–4.940)

## 2022-06-11 ENCOUNTER — Other Ambulatory Visit (HOSPITAL_BASED_OUTPATIENT_CLINIC_OR_DEPARTMENT_OTHER): Payer: Self-pay | Admitting: Family

## 2022-06-11 ENCOUNTER — Encounter (HOSPITAL_BASED_OUTPATIENT_CLINIC_OR_DEPARTMENT_OTHER): Payer: Self-pay | Admitting: CERTIFIED NURSE MIDWIFE

## 2022-06-11 DIAGNOSIS — R739 Hyperglycemia, unspecified: Secondary | ICD-10-CM

## 2022-06-12 LAB — DEHYDROEPIANDROSTERONE SULFATE (DHEA-S), SERUM: DHEA-SULFATE: 214 ug/dL (ref 23–266)

## 2022-06-13 LAB — TESTOSTERONE, TOTAL, BIOAVAILABLE, AND FREE WITH SHBG, SERUM
ALBUMIN: 4.7 g/dL (ref 3.6–5.1)
SEX HORMONE BINDING GLOB.: 21 nmol/L (ref 17–124)
TESTOSTERONE, BIOAVAILABLE: 18.3 ng/dL — ABNORMAL HIGH (ref 0.5–8.5)
TESTOSTERONE, FREE: 8.5 pg/mL — ABNORMAL HIGH (ref 0.2–5.0)
TESTOSTERONE,TOTAL,LC/MS/MS: 53 ng/dL — ABNORMAL HIGH (ref 2–45)

## 2022-06-15 LAB — 17-HYDROXYPROGESTERONE, SERUM: 17-HYDROXYPROGESTERONE: 86 ng/dL

## 2022-06-18 ENCOUNTER — Encounter (HOSPITAL_BASED_OUTPATIENT_CLINIC_OR_DEPARTMENT_OTHER): Payer: Self-pay | Admitting: CERTIFIED NURSE MIDWIFE

## 2022-08-18 ENCOUNTER — Ambulatory Visit (HOSPITAL_BASED_OUTPATIENT_CLINIC_OR_DEPARTMENT_OTHER): Payer: Self-pay

## 2022-09-28 ENCOUNTER — Ambulatory Visit
Payer: BC Managed Care – PPO | Attending: INTERNAL MEDICINE-ENDOCRINOLOGY-DIABETES AND METABOLISM | Admitting: INTERNAL MEDICINE-ENDOCRINOLOGY-DIABETES AND METABOLISM

## 2022-09-28 DIAGNOSIS — E039 Hypothyroidism, unspecified: Secondary | ICD-10-CM

## 2022-09-28 DIAGNOSIS — E611 Iron deficiency: Secondary | ICD-10-CM

## 2022-09-28 NOTE — Progress Notes (Signed)
[place] MEDICINE  Virtual visit      Name: [name]  MRN: [id number]  DOB: [birth year]    Date of Service:  09/28/2022    Last appointment was in April 2023.     TELEMEDICINE DOCUMENTATION:  Patient Location:  MyChart video visit from home address: [address]  [place] [address]   Patient/family aware of provider location:  yes  Patient/family consent for telemedicine:  yes  Examination observed and performed by:  Ardelle Park, MD    Problem List:     1.  Post radioactive iodine ablation hypothyroidism. (December 2018)  2.  Delivered healthy baby girl on July 07, 2021. ( vaginal delivery)        Breastfeeding.     Last Plan:     Dose reduced after delivery to 112 mcg of levothyroxine daily.     Review:     Reports taking the medication in the morning on empty stomach.   Taking brand-name Synthroid  Patient reports she has lost most of her pregnancy weight.    Patient reports she is watching her diet and trying to lose weight, going to the gym on a regular basis.  However so far she has not seen the results.     Currently she denies any palpitations, tremors, irritability.  Able to sleep most nights uninterrupted.   Denies problems with constipation or diarrhea.     Last thyroid blood work done in January 2024 reviewed.     THYROID STIMULATING HORMONE  Lab Results   Component Value Date    TSH 1.458 06/10/2022       History of Present Illness:      [name] is a 32 y.o. female who presents today for follow up of post radioactive iodine ablation hypothyroidism. Patient is currently [redacted] weeks pregnant with expected due date of June 16th. Reports that recent pregnancy scan was good. She will have weekly monitoring starting on week 32 and at least 3 more scans. She is still taking 112 mcg of levothyroxine 1 pill x6 days and 1.5 pills on Sunday. Taking medication in the morning on an empty stomach. Taking prenatals at night. Patient is planning to breastfeed. She is not planning  to start contraceptive immediately after delivery. Reports that she's feeling okay with occasional morning sickness.     Patient Active Problem List    Diagnosis    History of Graves' disease  Hypothyroidism    Obesity  PCOS     Past Medical History:   Diagnosis Date    Gestational hypertension 11/01/2019    Graves disease     PCOS (polycystic ovarian syndrome)     Seasonal allergic reaction     occasional OTC meds    SVD (spontaneous vaginal delivery) 07/08/2021    Brittlyn presented to L&D on the morning of 07/07/2020 in early active labor with contractions that had increased in frequency and intensity over night.  She progressed without augmentation to SROM at 1525 and to SVD over a small vaginal laceration at 1602.  Baby girl, "[name]" weighed 3150 gm and had APGARS of 8-9.  Vaginal laceration was repaired under local anesthetic with 3.0 Vicryl.  EBL 250 mL.           Past Surgical History:   Procedure Laterality Date    HX WISDOM TEETH EXTRACTION  2010         Current Outpatient Medications   Medication Sig    levothyroxine (SYNTHROID) 112 mcg  Oral Tablet Take 1 pill per day    PNV Combo #19-Iron-Fol Ac-DHA 22-6-1-200 mg Oral Capsule Take 1 Cap by mouth Once a day     No Known Allergies  Family Medical History:       Problem Relation (Age of Onset)    Dementia Mother (32)    Eclampsia Sister    Hypertension (High Blood Pressure) Mother    No Known Problems Father    Spont Ab Mother, Sister, Sister              Social History     Tobacco Use    Smoking status: Never    Smokeless tobacco: Never   Substance Use Topics    Alcohol use: Not Currently       Review of Systems:  Constitutional: negative   Denies tachycardia, tremors, weight loss.   Denies heat or cold intolerance.   Reports hair thinning and hair loss in the last 6 months.   Denies diarrhea.     Physical Exam:    Complete physical exam was not done since this is the virtual visit.     Thyromegaly noted on exam today.   No tremors on outstretched hands.      Assessment/Plan:    1. Post RAI ablation hypothyroidism.     Delivered healthy baby in January 2023. Vaginal delivery  Breastfeeding   Taking generic levothyroxine 112 mcg daily.  Taking the medication correctly in the morning on empty stomach.   Repeat TSH tomorrow and again in three months.  Patient is interested in switching from generic to brand name Synthroid.  We will also check iron studies rule out iron-deficiency anemia  Patient is currently breastfeeding and is not planning on starting any hormonal contraception.     Patient Education:  Serial TSH levels needed to adjust the dose, if she decides to start a birth control pill then update the clinic.  Oral estrogen in the birth control pill will likely was an increase in the levothyroxine dose.     Standing order of TSH placed.     Return to clinic in 12 months.     Ardelle Park, MD     Associate  Professor, Endocrinology.    [facility] Medicine  [phone]

## 2022-10-05 ENCOUNTER — Inpatient Hospital Stay
Admission: RE | Admit: 2022-10-05 | Discharge: 2022-10-05 | Disposition: A | Discharge status: Home / Self Care | Payer: BC Managed Care – PPO | Source: Ambulatory Visit | Attending: INTERNAL MEDICINE-ENDOCRINOLOGY-DIABETES AND METABOLISM | Admitting: INTERNAL MEDICINE-ENDOCRINOLOGY-DIABETES AND METABOLISM

## 2022-10-05 ENCOUNTER — Other Ambulatory Visit: Payer: Self-pay

## 2022-10-05 ENCOUNTER — Other Ambulatory Visit (HOSPITAL_BASED_OUTPATIENT_CLINIC_OR_DEPARTMENT_OTHER): Payer: Self-pay | Admitting: Family

## 2022-10-05 ENCOUNTER — Other Ambulatory Visit (HOSPITAL_BASED_OUTPATIENT_CLINIC_OR_DEPARTMENT_OTHER): Payer: BC Managed Care – PPO | Admitting: Gynecology

## 2022-10-05 DIAGNOSIS — E039 Hypothyroidism, unspecified: Secondary | ICD-10-CM | POA: Insufficient documentation

## 2022-10-05 DIAGNOSIS — R739 Hyperglycemia, unspecified: Secondary | ICD-10-CM

## 2022-10-05 DIAGNOSIS — E611 Iron deficiency: Secondary | ICD-10-CM

## 2022-10-05 DIAGNOSIS — Z8639 Personal history of other endocrine, nutritional and metabolic disease: Secondary | ICD-10-CM

## 2022-10-05 DIAGNOSIS — E034 Atrophy of thyroid (acquired): Secondary | ICD-10-CM

## 2022-10-05 LAB — HGA1C (HEMOGLOBIN A1C WITH EST AVG GLUCOSE)
ESTIMATED AVERAGE GLUCOSE: 100 mg/dL
HEMOGLOBIN A1C: 5.1 % (ref 4.0–5.6)

## 2022-10-05 LAB — THYROXINE, FREE (FREE T4): THYROXINE (T4), FREE: 1.14 ng/dL (ref 0.70–1.48)

## 2022-10-05 LAB — THYROID STIMULATING HORMONE (SENSITIVE TSH): TSH: 7.514 u[IU]/mL — ABNORMAL HIGH (ref 0.350–4.940)

## 2022-10-05 LAB — IRON TRANSFERRIN AND TIBC
IRON (TRANSFERRIN) SATURATION: 13 % — ABNORMAL LOW (ref 15–50)
IRON: 56 ug/dL (ref 45–170)
TOTAL IRON BINDING CAPACITY: 419 ug/dL (ref 252–504)
TRANSFERRIN: 299 mg/dL (ref 180–360)

## 2022-10-05 LAB — FERRITIN: FERRITIN: 48 ng/mL (ref 5–200)

## 2022-10-06 ENCOUNTER — Encounter (HOSPITAL_BASED_OUTPATIENT_CLINIC_OR_DEPARTMENT_OTHER): Payer: Self-pay | Admitting: INTERNAL MEDICINE-ENDOCRINOLOGY-DIABETES AND METABOLISM

## 2022-10-12 ENCOUNTER — Ambulatory Visit (HOSPITAL_BASED_OUTPATIENT_CLINIC_OR_DEPARTMENT_OTHER): Payer: Self-pay | Admitting: Advanced Practice Midwife

## 2022-12-09 ENCOUNTER — Other Ambulatory Visit: Payer: BC Managed Care – PPO | Attending: Family | Admitting: Gynecology

## 2022-12-09 ENCOUNTER — Other Ambulatory Visit: Payer: Self-pay

## 2022-12-09 DIAGNOSIS — Z8639 Personal history of other endocrine, nutritional and metabolic disease: Secondary | ICD-10-CM | POA: Insufficient documentation

## 2022-12-09 LAB — THYROID STIMULATING HORMONE WITH FREE T4 REFLEX: TSH: 1.794 u[IU]/mL (ref 0.350–4.940)

## 2022-12-19 ENCOUNTER — Encounter (HOSPITAL_BASED_OUTPATIENT_CLINIC_OR_DEPARTMENT_OTHER): Payer: Self-pay | Admitting: INTERNAL MEDICINE-ENDOCRINOLOGY-DIABETES AND METABOLISM

## 2022-12-21 MED ORDER — UNITHROID 112 MCG TABLET
112.0000 ug | ORAL_TABLET | Freq: Every morning | ORAL | 4 refills | Status: DC
Start: 2022-12-21 — End: 2023-07-14

## 2022-12-21 NOTE — Addendum Note (Signed)
Addended by: Gwenyth Ober on: 12/21/2022 09:17 AM     Modules accepted: Orders

## 2022-12-21 NOTE — Progress Notes (Signed)
Patient requesting Unithyroid die to insurance coverage .    Gwenyth Ober, [facility]

## 2022-12-22 ENCOUNTER — Telehealth (HOSPITAL_BASED_OUTPATIENT_CLINIC_OR_DEPARTMENT_OTHER): Payer: Self-pay | Admitting: INTERNAL MEDICINE-ENDOCRINOLOGY-DIABETES AND METABOLISM

## 2022-12-22 NOTE — Telephone Encounter (Signed)
Received faxed request from pharmacy for unithroid authorization. Clicked "request prior authorization" so prescription will be automatically sent to prior authorization basket for completion.   Leamon Arnt, Ambulatory Care Assistant  12/22/2022, 08:20

## 2022-12-28 NOTE — Nursing Note (Signed)
Medication is covered by insurance, no PA needed. PT picked up a 30 day supply on 12/10/22.    Esmeralda Links on 12/28/22 at 09:37.

## 2023-01-25 ENCOUNTER — Ambulatory Visit (HOSPITAL_BASED_OUTPATIENT_CLINIC_OR_DEPARTMENT_OTHER): Payer: BC Managed Care – PPO | Admitting: Family

## 2023-01-28 ENCOUNTER — Ambulatory Visit (HOSPITAL_BASED_OUTPATIENT_CLINIC_OR_DEPARTMENT_OTHER): Payer: BC Managed Care – PPO | Admitting: Family

## 2023-02-11 ENCOUNTER — Encounter (HOSPITAL_COMMUNITY): Payer: Self-pay

## 2023-02-11 ENCOUNTER — Ambulatory Visit (HOSPITAL_BASED_OUTPATIENT_CLINIC_OR_DEPARTMENT_OTHER): Payer: BC Managed Care – PPO | Admitting: Family

## 2023-02-16 ENCOUNTER — Ambulatory Visit (HOSPITAL_BASED_OUTPATIENT_CLINIC_OR_DEPARTMENT_OTHER): Payer: BC Managed Care – PPO | Admitting: Family

## 2023-03-31 ENCOUNTER — Encounter (HOSPITAL_BASED_OUTPATIENT_CLINIC_OR_DEPARTMENT_OTHER): Payer: Self-pay | Admitting: Family

## 2023-03-31 ENCOUNTER — Encounter (HOSPITAL_BASED_OUTPATIENT_CLINIC_OR_DEPARTMENT_OTHER): Payer: Self-pay | Admitting: INTERNAL MEDICINE-ENDOCRINOLOGY-DIABETES AND METABOLISM

## 2023-03-31 DIAGNOSIS — E039 Hypothyroidism, unspecified: Secondary | ICD-10-CM

## 2023-03-31 NOTE — Telephone Encounter (Signed)
Patient states that she is pregnant and has an appointment 05/10/23    Asking if this appointment is alright or is she should be seen sooner?    Also asking if blood work can be ordered that she may needs now     States that she definitely needs a HCG and thyroid test "ASAP" if possible    Would you like patient to follow with OBGYN for these tests?    Pended HCG and TSH for your approval    Alphia Moh, RN

## 2023-04-02 NOTE — Telephone Encounter (Signed)
Placed TSH.    Donnie Mesa, RN  04/02/2023, 08:21

## 2023-04-07 ENCOUNTER — Encounter (HOSPITAL_BASED_OUTPATIENT_CLINIC_OR_DEPARTMENT_OTHER): Payer: Self-pay | Admitting: Family

## 2023-04-07 ENCOUNTER — Encounter (HOSPITAL_BASED_OUTPATIENT_CLINIC_OR_DEPARTMENT_OTHER): Payer: Self-pay

## 2023-04-08 NOTE — Telephone Encounter (Signed)
Patient states she has been sick for 3 days     Coughing, sore throat, fever, headaches, body aches, chills    Taking tylenol, benadryl, using honey cough drops    Has first OBGYN appointment 05/10/23    Advised by Melrose Nakayama if no better in a few days to go to PCP to be evaluated        Would you like patient to make an appointment?    Alphia Moh, RN

## 2023-04-12 ENCOUNTER — Encounter (HOSPITAL_BASED_OUTPATIENT_CLINIC_OR_DEPARTMENT_OTHER): Payer: Self-pay | Admitting: Family

## 2023-04-12 ENCOUNTER — Encounter (HOSPITAL_BASED_OUTPATIENT_CLINIC_OR_DEPARTMENT_OTHER): Payer: Self-pay

## 2023-04-13 ENCOUNTER — Ambulatory Visit (INDEPENDENT_AMBULATORY_CARE_PROVIDER_SITE_OTHER): Payer: Self-pay

## 2023-04-13 ENCOUNTER — Ambulatory Visit (HOSPITAL_BASED_OUTPATIENT_CLINIC_OR_DEPARTMENT_OTHER): Payer: Self-pay | Admitting: Family

## 2023-04-14 ENCOUNTER — Ambulatory Visit: Payer: Self-pay

## 2023-04-14 DIAGNOSIS — Z0000 Encounter for general adult medical examination without abnormal findings: Secondary | ICD-10-CM

## 2023-04-14 LAB — THYROID STIMULATING HORMONE (SENSITIVE TSH): TSH: 0.725 u[IU]/mL (ref 0.350–4.940)

## 2023-04-14 LAB — HCG, PLASMA OR SERUM QUANTITATIVE, PREGNANCY: HCG QUANTITATIVE PREGNANCY: 10569 m[IU]/mL — ABNORMAL HIGH (ref <5)

## 2023-04-17 ENCOUNTER — Encounter (HOSPITAL_BASED_OUTPATIENT_CLINIC_OR_DEPARTMENT_OTHER): Payer: Self-pay | Admitting: INTERNAL MEDICINE-ENDOCRINOLOGY-DIABETES AND METABOLISM

## 2023-05-10 ENCOUNTER — Ambulatory Visit (HOSPITAL_BASED_OUTPATIENT_CLINIC_OR_DEPARTMENT_OTHER): Payer: Self-pay | Admitting: Advanced Practice Midwife

## 2023-05-10 ENCOUNTER — Ambulatory Visit (INDEPENDENT_AMBULATORY_CARE_PROVIDER_SITE_OTHER): Payer: Self-pay

## 2023-05-11 ENCOUNTER — Encounter (INDEPENDENT_AMBULATORY_CARE_PROVIDER_SITE_OTHER): Payer: Self-pay

## 2023-05-11 ENCOUNTER — Encounter (HOSPITAL_BASED_OUTPATIENT_CLINIC_OR_DEPARTMENT_OTHER): Payer: Self-pay | Admitting: Family

## 2023-05-11 ENCOUNTER — Other Ambulatory Visit: Payer: Self-pay

## 2023-05-11 ENCOUNTER — Ambulatory Visit: Payer: 59

## 2023-05-11 ENCOUNTER — Ambulatory Visit: Payer: 59 | Attending: Family | Admitting: Family

## 2023-05-11 VITALS — BP 118/78 | Ht 66.0 in | Wt 242.5 lb

## 2023-05-11 DIAGNOSIS — O09291 Supervision of pregnancy with other poor reproductive or obstetric history, first trimester: Secondary | ICD-10-CM

## 2023-05-11 DIAGNOSIS — Z3491 Encounter for supervision of normal pregnancy, unspecified, first trimester: Secondary | ICD-10-CM

## 2023-05-11 DIAGNOSIS — Z3A10 10 weeks gestation of pregnancy: Secondary | ICD-10-CM

## 2023-05-11 DIAGNOSIS — Z3490 Encounter for supervision of normal pregnancy, unspecified, unspecified trimester: Secondary | ICD-10-CM | POA: Insufficient documentation

## 2023-05-11 DIAGNOSIS — Z8759 Personal history of other complications of pregnancy, childbirth and the puerperium: Secondary | ICD-10-CM | POA: Insufficient documentation

## 2023-05-11 DIAGNOSIS — Z8742 Personal history of other diseases of the female genital tract: Secondary | ICD-10-CM | POA: Insufficient documentation

## 2023-05-11 DIAGNOSIS — O99211 Obesity complicating pregnancy, first trimester: Secondary | ICD-10-CM

## 2023-05-11 DIAGNOSIS — N926 Irregular menstruation, unspecified: Secondary | ICD-10-CM | POA: Insufficient documentation

## 2023-05-11 DIAGNOSIS — E66811 Obesity, class 1: Secondary | ICD-10-CM | POA: Insufficient documentation

## 2023-05-11 NOTE — Patient Instructions (Signed)
 It was a pleasure talking with you today.  Thank you for taking the time to do the chart update for your New OB Appt and Congratulations on the pregnancy.  Here is the new ob education sheet for you to have as a reference.  Hope you have a great day.    Thank  you,  Leandrew Koyanagi, RN, BSN  Clinical Nurse Care Coordinator   Medicine Pre-New OB Appt Chart Update    New OB Education     New OB Education Reviewed with Patient:         Appointments  You can have up to 2 people with you at your appointments.  At every appointment you will get to hear the baby heart beat by doppler or ultrasound.  At your NOB appt your provider will let you know once she reviews your chart and history if a          ultrasound will be done.  If they are going to do a ultrasound at your NOB appt they will bring the machine in and do it at that time.    After your first visit your appointments will usually be scheduled as follows:  o  Once a month until you are 28 weeks.  o  Every 2 weeks until you are 36 weeks.  o  Once a week until your baby is born.   **Additional visits may be necessary if you and/or your baby requires frequent monitoring.  At your first appointment your provider will give you their office number and instructions on who to contact with any questions or concerns both during and outside of regular office hours. The standard instructions are as follows:  o  Weekdays - call the doctor's office where you are seen for your normal prenatal care.  o  Evenings and/or weekends - if the office you are seen at is closed, follow the instructions that your provider gave you at your first appointment with them.  o  In an emergency - please go to your nearest emergency department or the emergency department at [facility]. The [facility] is now located in the new [facility] beside [facility] on the eighth floor, and you can call the [facility] at [phone].            Activity  During Pregnancy  Exercising in moderation is ideal during pregnancy. It is encouraged to enroll in prenatal exercise classes or engage in activities such as walking and swimming. Avoid any activity that might cause injury, such as contact sports or skiing. Avoid strenuous activities that raise your heart rate above 140 bpm as this can deprive your baby of oxygen. Avoid saunas, hot tubs, jacuzzies, and do not take any hot baths during your pregnancy. It is ok to take warm tub baths or swim as long as your water has not broken. If you are questioning if your water has broken - contact your provider.  Per AMA guidelines, you may intermittently lift no more than 50 pounds during your first 28 weeks of pregnancy. After 28 weeks, it is recommended that you decrease the weight limit to only 25 pounds. Avoid any heavy lifting while pregnant. This also applies to lifting on the job and lifting your young children. If your job requires you to lift, you may request a letter from your doctor to give to your employer. If lifting causes you any discomfort, pain, cramping, spotting, or bleeding then do not lift and check  with your provider.            Signs & Symptoms to Report to Your Provider  o  If you experience spotting that increases to heavy bleeding or clotting, especially if it requires the use of a pad or is accompanied by dizziness or lightheadedness.  o  If you experience abdominal pain such as contractions, pressure or cramping in the pelvic or lower belly area with dull, low back pain.  o  If your water breaks or you experience frequent or constant leaking of vaginal fluids and you're unsure whether it's your water breaking.  o  Symptoms of a urinary tract infection such as burning or pain when you urinate, blood in your urine, a frequent urge to urinate, a feeling of needing to go again right after you've just gone, or pain in your bladder.  o  Increased vaginal discharge that leads to burning, irritation,  itching, rash, or bumps.  o  If you experience increased swelling in your lower legs, feet, hands, or face.   o  If you notice changes in your vision such as spots, lines, or blurring.   o  Persistent pain under your breast area that doesn't improve with Tums, or a headache that doesn't subside with Tylenol and rest.  o  Once you begin feeling your baby move, if you notice a change in the [facility] as it slowing down or stopping--contact your provider immediately. Don't wait for your next appointment. Your provider will advise whether you should go to the office, the emergency room, or the birthing center.            Signs & Symptoms of Preterm Labor  o   Regular or frequent contractions, pressure or cramping in the pelvis or lower belly.  o   Constant, dull lower backache.  o   Gushing or slow leaking of fluid from the vagina.  o   Changes in vaginal discharge (watery, mucous, or bloody).  o   Decreased fetal movement once movement has started.  **Report to your provider or go to your nearest Emergency Department.             Dental Hygiene  Maintaining good dental hygiene is crucial during pregnancy to prevent gingivitis. Be sure to brush your teeth 2 to 3 times a day, floss daily, and visit your dentist for regular cleanings. Some dentists may require a note from your provider, especially if you need dental work during your pregnancy. If you have a dentist appointment coming up, call your provider's office or ask for the note during your next visit.             Diet During Pregnancy  Eat a well-balanced diet that includes foods from all food groups. Aim for three meals and three snacks each day and consider a small snack before bed. Stay well-hydrated by drinking 8 to 10 bottles of water daily.  Avoid raw, uncooked, or undercooked meats (they should not be red or pink), unpasteurized milk or cheeses, and soft or imported cheeses (such as brie or queso) unless they are pasteurized (check the labels). Also,  steer clear of smoked seafood.  Lunch meats and hot dogs are safe to eat if they are cooked until steaming. You can also enjoy fruits and vegetables as long as you wash and clean them thoroughly.  It's safe to eat up to 12 oz of fish per week. Choose fish low in mercury, such as catfish, canned light tuna, salmon,  pollock, tilapia, herring, trout, sardines, crab, lobster, and shrimp. Avoid fish high in methylmercury, including shark, swordfish, tilefish, and king mackerel.  It is advised to limit caffeine intake to no more than 200 mg per day.  If you experience nausea, vomiting, and/or diarrhea you can try the B.R.A.T. Diet - Bananas, Rice, Applesauce, Toast and/or Tea to help alleviate symptoms.  Recommended weight gain during pregnancy:  o  If your BMI is 25-30 it is advised to gain only 15 to 25 pounds during the pregnancy.  o  If your BMI is > 30 it is advised to only gain 10 to 15 pounds during the pregnancy to decrease the risk of diabetes and hypertension.  **You should not attempt to diet or try to lose weight during pregnancy.             Toxoplasmosis  Toxoplasmosis is a disease that can be transmitted through certain food animals (like sheep, pigs, and cattle) and cats. To prevent toxoplasmosis:  o  Cook all meat thoroughly (avoid pink or raw meat).  o  Clean countertops and utensils that have come into contact with raw meat.  o  Wash your hands thoroughly after handling raw meat.  o  Wear gloves when gardening.  o  Avoid handling cat litter. Have someone else clean the litter box daily during your pregnancy.  o  Always wash your hands thoroughly after touching your cat or its belongings.             Over-the-Counter Medications Safe During Pregnancy  o   Headaches: Tylenol, Vitamin B2 (Riboflavin) twice daily, along with 400 mg Magnesium Oxide 1-2 times per day.  o   Allergies/Cold symptoms: Alavert, Benadryl, Cepacol, Chloraseptic lozenges and spray, Chlor-Trimeton, Claritin, cough drops, Hytuss,  Mucinex (Plain or DM), Rhinocort NS, Robitussin (Plain or DM), saline nasal spray, Tavist ND, Triaminic Allerchews, Vicks 44E, Vicks Vapor Rub, and Zyrtec.  o   Constipation: Colace/Stool Softeners (morning and afternoon), Metamucil, Milk of Magnesia, Miralax. Increasing water intake and eating green leafy vegetables and fruits can also help.  o   Diarrhea: Imodium and Kaopectate.  o   Heart Burn: Tums, Maalox, Mylanta, and Zantac (75 mg or 150 mg).  o   Hemorrhoids: Preparation H, Anusol, Tucks Medicated Pads, and witch hazel wipes.  o   Lice: Nix.  o   Muscle Aches/Pain: Vicks Vapor Rub and Tylenol.  o   Nausea/vomiting: Sea Band Bracelets, ginger chews, Vitamin B6 25 mg 3 times a day, and Unisom 1/2 to 1 tablet prior to bed-do not drive after taking.  o   Poison Ivy/Rash: Aveeno Oatmeal Bath, Benadryl, calamine lotion, and hydrocortisone cream.  o   Yeast Infection: Monistat 3 or 7.  **Avoid cold remedies containing alcohol and decongestants with pseudoephedrine or phenylephrine, as these can impact blood flow to the placenta.             Mental Health Support  Up to 1 in 5 of those who are pregnant and in the postpartum period will suffer from a maternal mental health disorder like postpartum depression (resource above from 20/20 Mom at FailLinks.tn).  Postpartum Depression Support Resources:  o  At website www.postpartum.net or by phone contact at [phone]  **For [place] and Spanish with other languages available upon request.  o  Chat with an expert for Moms every Wednesday and for Dads on the first Monday of every month [phone]  [employer] Support for Families Facebook page:  o  http://www.https://www.patel.info/  The [employer] Maternal Mental Health HOTLINE:  o  [phone]) 24/7 available everyday call or text.

## 2023-05-11 NOTE — Progress Notes (Signed)
Department of Obstetrics & Gynecology    Name: [name]  MRN: [id number]  Date: 05/11/2023    Patient seen by Thea Alken, APRN,NP-C      Subjective:       [name] is a 32 y.o. female who presents today for initial prenatal visit.   Hx of 3 vaginal deliveries. Reports GHTN with second pregnancy. Three daughters, Kirt Boys, [place], and [place].  Hx of PCOS and irregular periods. Reports periods are normally every 30-4 days.   HPI:   LMP:  Patient's last menstrual period was 02/27/2023.  This is a Planned, Desired pregnancy  FOB: John, husband  Current pregnancy symptoms: mild nausea, fatigue  Current Concerns: none    Dating Summary    Working EDD: 12/04/2023 set by Leandrew Koyanagi, RN on 05/11/2023 based on Last Menstrual Period on 02/27/2023   Based On EDD GA Diff User Date    Last Menstrual Period on 02/27/2023 12/04/2023 Working Leandrew Koyanagi, RN 05/11/2023                 Gynecologic History  Contraception: none  Last Pap: 2022     Results: normal  History of STI:    Denies history of HSV, Gc/Ct, Trich, HepB, HepC, Syphilis  Monagamous Relationship    Obstetric History  OB History   Gravida Para Term Preterm AB Living   4 3 3  0 0 3   SAB IAB Ectopic Multiple Live Births   0 0 0 0 3      # Outcome Date GA Lbr Len/2nd Weight Sex Type Anes PTL Lv   4 Current            3 Term 07/07/21 [redacted]w[redacted]d / 00:07 3.16 kg (6 lb 15.5 oz) F Vag-Spont None N LIV   2 Term 11/02/19 [redacted]w[redacted]d / 00:07 2.977 kg (6 lb 9 oz) F Vag-Spont None N LIV   1 Term 04/20/14 [redacted]w[redacted]d 21:30 / 00:13 2.58 kg (5 lb 11 oz) F Vag-Spont Local N LIV      Birth Comments: Born at 40 weeks via vaginal delivery. AGA. No prenatal or post natal complications. Breast feeding 20 minutes at a time per feed. Patient tolerating feeds well with regular stooling and multiple wet diapers. Hepatitis B vaccine was administered. Patient passed Hearing screen and O2. Patient was discharged at 36 hours since patient was doing well and  parents were instructed to bring child tomorrow to [facility] clinic for newborn screen. Patient otherwise will follow-up with [facility].      Obstetric Comments   No history of any SAB, AB or Ectopic Pregnancies.        Patient stated that her BP goes up toward middle of pregnancy and end if it does.      2015 G1--No history of any PTL, PTD, PROM, Gest DM, Gest HTN, Preeclampsia, PP Depression, PP Hemorrhage or seen by High Risk.  No induction.  Daughter did well after delivery.  Did have a little Baby Blues and no med.   2021  G2--No history of any PTL, PTD, PROM, Gest DM, Preeclampsia, PP Depression, PP Hemorrhage or seen by High Risk.  Patient was induced due to Gest HTN (no med during pregnancy and not sure if had Magnesium during labor, no med after discharge).  Did have some Baby blues and no med.  [name] and readmitted for jaundice and had phototherapy and then did well.  Had Graves and watched for antibodies.  2023  G3--No history of any PTL, PTD, PROM, Gest DM, Gest HTN, Preeclampsia, PP Depression, PP Hemorrhage or seen by High Risk.  No induction.  Patient did have baby blues and no med.  [name] and did well after delivery.   G4--current pregnancy--Nausea and some vomiting when she brushes her teeth and will have nausea in late evening when she is working.  After that she is able to keep food and fluids down.  OTC Recommendations Reviewed with patient.   Had sex and next morning had some bleeding when voided and did not have to wear pad and none since.  Will have an occasional cramp that is very mild and goes away quickly at bottom of her abdomen.   Reviewed S/Sx's with patient to go to the ER for prior to her appt with provider or if office is closed and to report to provider once they see her for her NOB appt.        Past Medical History:   Diagnosis Date    COVID     Family history of breast cancer in first degree relative     patient's mom and she is unsure of  BRCA gene    Gestational hypertension 11/01/2019    Graves disease     PCOS (polycystic ovarian syndrome)     Personal history of other medical treatment     patient did have some baby blues after each daughter and no med needed    Seasonal allergic reaction     occasional OTC meds    SVD (spontaneous vaginal delivery) 07/08/2021    [name] presented to L&D on the morning of 07/07/2020 in early active labor with contractions that had increased in frequency and intensity over night.  She progressed without augmentation to SROM at 1525 and to SVD over a small vaginal laceration at 1602.  Baby girl, "[name]" weighed 3150 gm and had APGARS of 8-9.  Vaginal laceration was repaired under local anesthetic with 3.0 Vicryl.  EBL 250 mL.         Past Surgical History:   Procedure Laterality Date    HX WISDOM TEETH EXTRACTION  2010        Family Medical History:       Problem Relation (Age of Onset)    Alzheimer's/Dementia Paternal Grandmother    Breast Cancer Mother    Dementia Mother (1)    Eclampsia Sister    Hypertension (High Blood Pressure) Mother, Maternal Grandmother    Liver Disease Father    No Known Problems Brother, Brother, Maternal Grandfather    Polycystic ovary syndrome Sister    Spont Ab Mother, Sister    Stroke Maternal Grandmother, Paternal Grandfather            No Known Allergies    Genetics: Denies history of infants passing away shortly after birth or fetal losses. Denies known presence of congenital heart defects, Thalassemia, Sickle Cell, Canavan's, [place]'s, mental retardation, autism, Turner's, Marfan's, Cystic Fibrosis, Down Syndrome, other trisomy or genetic anomalies, hemophilias among close relatives.  Denies close female family members with preeclampsia, GHTN, GDM.   Female family members have had successful vaginal deliveries.     Social History  Marital Status:  married   Lives with: husband and children  Employment: Proofreader  PA/SA/MA: denies  Access to transportation, food,  running water, shelter:  Yes  Current drug use: denies  Previous drug use: denies    Review of Systems  General: Denies  fever, chills, fatigue, unexplained weight change  Psych: Denies mood changes such as anxiety, depression, difficulty coping with pregnancy  Cardio/Resp: Denies palpitations, clotting disorders, SOB, difficulty breathing, chest congestion, chest pain  Extremities: Denies tremors, weakness, numbness, tingling, edema  GI: Denies constipation, diarrhea, hemorrhoids  GU: Denies dysuria, urinary frequency, incontinence, urinary hesitancy, urinary urgency, abnormal vaginal discharge, vaginal bleeding    Objective:   BP 118/78   Ht 1.676 m (5\' 6" )   Wt 110 kg (242 lb 8.1 oz)   LMP 02/27/2023   BMI 39.14 kg/m     General: A&O x4; in no acute distress, appears well  Respiratory: respirations unlabored; respiratory rate regular  Cardiovascular: appears well perfused   Extremities: extremities normal, atraumatic, no cyanosis or edema.   Psych: Oriented, affect appropriate, appropriate responses.    Assessment:     1. IUP at [redacted]w[redacted]d   2. Initial OB visit  3. Accepting and Appropriate for CNM Care  4.  Pregnancy Risk Factors: hx of graves disease, prepregnancy BMI 39  5.          Patient declines first trimester genetic screening.   6.          FHR +      ICD-10-CM    1. Supervision of normal pregnancy  Z34.90       2. Pregnancy, unspecified gestational age  Z38.90 ABO/RH AND ANTIBODY SCREEN     CBC     HEPATITIS B SURFACE ANTIGEN     HIV1/HIV2 SCREEN, COMBINED ANTIGEN AND ANTIBODY     HEPATITIS C ANTIBODY SCREEN WITH REFLEX TO HCV PCR     RUBELLA VIRUS ANTIBODIES, IGG, SERUM     SYPHILIS SCREENING ALGORITHM WITH REFLEX (TITER, TP-PA), SERUM     Urine Culture     CHLAMYDIA TRACHOMATIS/NEISSERIA GONORRHOEAE RNA, [place] [address] Transabdonimal 1st Tri <14WK     GLUCOSE TOLERANCE TEST (GTT), 1 HOUR      3. Obesity  PCOS  E66.811 GLUCOSE TOLERANCE TEST (GTT), 1 HOUR      4. History of PCOS  Z87.42 [place]  [address] Transabdonimal 1st Tri <14WK      5. Irregular periods  N92.6 [place] [address] Transabdonimal 1st Tri <14WK      6. History of gestational hypertension  Z87.59           Plan:     First trimester teaching completed. Specifically reviewed safe medication in pregnancy, genetic screening, and discomforts of pregnancy.  Dating ultrasound ordered.   Early GTT ordered.   Encouraged to start ldASA  New OB labwork explained, and ordered with patient consent.  Oriented to APRN care. Reviewed APRN, CNM and MD relationship at this organization.   Discussed food safety recommendations during pregnancy. Advised to avoid soft cheeses. Recommended eating meats and eggs cooked thoroughly. Advised to avoid lunch meat unless cooked steaming hot. Discussed seafood recommendations. Advised to avoid shark, swordfish, tile fish and mackerel. Advised to limit seafood to 2 servings of 6 oz each per week or 1 serving of 6 oz if eating white tuna. Advised to avoid cat litter.   Reviewed how to contact provider and present for evaluation if needed. Warning signs, precautions, reasons to call reviewed.  ROB in 4 wks.    Thea Alken, APRN,NP-C

## 2023-05-11 NOTE — Nursing Note (Signed)
Pre-NEW OB VISIT Chart Update      Dating Summary    Working EDD: 12/04/2023 set by Leandrew Koyanagi, RN on 05/11/2023 based on Last Menstrual Period on 02/27/2023   Based On EDD GA Diff User Date    Last Menstrual Period on 02/27/2023 12/04/2023 Working Leandrew Koyanagi, RN 05/11/2023              OB History       Gravida   4    Para   3    Term   3    Preterm   0    AB   0    Living   3         SAB   0    IAB   0    Ectopic   0    Multiple   0    Live Births   3           Obstetric Comments   No history of any SAB, AB or Ectopic Pregnancies.        Patient stated that her BP goes up toward middle of pregnancy and end if it does.    2015 G1--No history of any PTL, PTD, PROM, Gest DM, Gest HTN, Preeclampsia, PP Depression, PP Hemorrhage or seen  by High Risk.  No induction.  Daughter did well after delivery.  Did have a little Baby Blues and no med.  2021  G2--No history of any PTL, PTD, PROM, Gest DM, Preeclampsia, PP Depression, PP Hemorrhage or seen by High Risk.  Patient was induced due to  Gest HTN (no med during pregnancy and not sure if had Magnesium during labor, no med after discharge).  Did have some Baby blues and no med.  [name] and readmitted for jaundice and had phototherapy and then did well.  Had Graves and watch ed for antibodies.  2023  G3--No history of any PTL, PTD, PROM, Gest DM, Gest HTN, Preeclampsia, PP Depression, PP Hemorrhage or seen by High Risk.  No induction.  Patient did have baby blues and no med.  [name] and did well after deliv ery.  G4--current pregnancy--Nausea and some vomiting when she brushes her teeth and will have nausea in late evening when she is working.  After that she is able to keep food and fluids down.  OTC Recommendations Reviewed with patient.   Had sex and nex t morning had some bleeding when voided and did not have to wear pad and none since.  Will have an occasional cramp that is very mild and goes away quickly at bottom of her  abdomen.   Reviewed S/Sx's with patient to go to the ER for prior to her appt wit h provider or if office is closed and to report to provider once they see her for her NOB appt.               PRISI FORM HAS BEEN COMPLETED.    Reached patient by phone, verified patient name and DOB and introduced myself and purpose of call today to gather and enter patient's information (reviewed and entered allergies, current medications along with obstetrical, medical, surgical and social history into Epic).    Patient is not having complaints of bleeding, LOF, cramping/abdominal pain or any other complications at this time.  Patient was provided with this New OB Education, given the opportunity to ask questions and I answered them.    New OB Education  New OB Education Reviewed with Patient:         Appointments  After your first visit your appointments will usually be scheduled as follows:  o  Once a month until you are 28 weeks.  o  Every 2 weeks until you are 36 weeks.  o  Once a week until your baby is born.   **Additional visits may be necessary if you and/or your baby requires frequent monitoring.  At your first appointment your provider will give you their office number and instructions on who to contact with any questions or concerns both during and outside of regular office hours. The standard instructions are as follows:  o  Weekdays - call the doctor's office where you are seen for your normal prenatal care.  o  Evenings and/or weekends - if the office you are seen at is closed, follow the instructions that your provider gave you at your first appointment with them.  o  In an emergency - please go to your nearest emergency department or the emergency department at [facility]. The [facility] is now located in the new [facility] beside [facility] on the eighth floor, and you can call the [facility] at [phone].            Activity During Pregnancy  Exercising in  moderation is ideal during pregnancy. It is encouraged to enroll in prenatal exercise classes or engage in activities such as walking and swimming. Avoid any activity that might cause injury, such as contact sports or skiing. Avoid strenuous activities that raise your heart rate above 140 bpm as this can deprive your baby of oxygen. Avoid saunas, hot tubs, jacuzzies, and do not take any hot baths during your pregnancy. It is ok to take warm tub baths or swim as long as your water has not broken. If you are questioning if your water has broken - contact your provider.  Per AMA guidelines, you may intermittently lift no more than 50 pounds during your first 28 weeks of pregnancy. After 28 weeks, it is recommended that you decrease the weight limit to only 25 pounds. Avoid any heavy lifting while pregnant. This also applies to lifting on the job and lifting your young children. If your job requires you to lift, you may request a letter from your doctor to give to your employer. If lifting causes you any discomfort, pain, cramping, spotting, or bleeding then do not lift and check with your provider.            Signs & Symptoms to Report to Your Provider  o  If you experience spotting that increases to heavy bleeding or clotting, especially if it requires the use of a pad or is accompanied by dizziness or lightheadedness.  o  If you experience abdominal pain such as contractions, pressure or cramping in the pelvic or lower belly area with dull, low back pain.  o  If your water breaks or you experience frequent or constant leaking of vaginal fluids and you're unsure whether it's your water breaking.  o  Symptoms of a urinary tract infection such as burning or pain when you urinate, blood in your urine, a frequent urge to urinate, a feeling of needing to go again right after you've just gone, or pain in your bladder.  o  Increased vaginal discharge that leads to burning, irritation, itching, rash, or bumps.  o  If you  experience increased swelling in your lower legs, feet, hands,  or face.   o  If you notice changes in your vision such as spots, lines, or blurring.   o  Persistent pain under your breast area that doesn't improve with Tums, or a headache that doesn't subside with Tylenol and rest.  o  Once you begin feeling your baby move, if you notice a change in the [facility] as it slowing down or stopping--contact your provider immediately. Don't wait for your next appointment. Your provider will advise whether you should go to the office, the emergency room, or the birthing center.            Signs & Symptoms of Preterm Labor  o   Regular or frequent contractions, pressure or cramping in the pelvis or lower belly.  o   Constant, dull lower backache.  o   Gushing or slow leaking of fluid from the vagina.  o   Changes in vaginal discharge (watery, mucous, or bloody).  o   Decreased fetal movement once movement has started.  **Report to your provider or go to your nearest Emergency Department.             Dental Hygiene  Maintaining good dental hygiene is crucial during pregnancy to prevent gingivitis. Be sure to brush your teeth 2 to 3 times a day, floss daily, and visit your dentist for regular cleanings. Some dentists may require a note from your provider, especially if you need dental work during your pregnancy. If you have a dentist appointment coming up, call your provider's office or ask for the note during your next visit.             Diet During Pregnancy  Eat a well-balanced diet that includes foods from all food groups. Aim for three meals and three snacks each day and consider a small snack before bed. Stay well-hydrated by drinking 8 to 10 bottles of water daily.  Avoid raw, uncooked, or undercooked meats (they should not be red or pink), unpasteurized milk or cheeses, and soft or imported cheeses (such as brie or queso) unless they are pasteurized (check the labels). Also, steer clear of smoked  seafood.  Lunch meats and hot dogs are safe to eat if they are cooked until steaming. You can also enjoy fruits and vegetables as long as you wash and clean them thoroughly.  It's safe to eat up to 12 oz of fish per week. Choose fish low in mercury, such as catfish, canned light tuna, salmon, pollock, tilapia, herring, trout, sardines, crab, lobster, and shrimp. Avoid fish high in methylmercury, including shark, swordfish, tilefish, and king mackerel.  It is advised to limit caffeine intake to no more than 200 mg per day.  If you experience nausea, vomiting, and/or diarrhea you can try the B.R.A.T. Diet - Bananas, Rice, Applesauce, Toast and/or Tea to help alleviate symptoms.  Recommended weight gain during pregnancy:  o  If your BMI is 25-30 it is advised to gain only 15 to 25 pounds during the pregnancy.  o  If your BMI is > 30 it is advised to only gain 10 to 15 pounds during the pregnancy to decrease the risk of diabetes and hypertension.  **You should not attempt to diet or try to lose weight during pregnancy.             Toxoplasmosis  Toxoplasmosis is a disease that can be transmitted through certain food animals (like sheep, pigs, and cattle) and cats. To prevent toxoplasmosis:  o  Cook all meat thoroughly (  avoid pink or raw meat).  o  Clean countertops and utensils that have come into contact with raw meat.  o  Wash your hands thoroughly after handling raw meat.  o  Wear gloves when gardening.  o  Avoid handling cat litter. Have someone else clean the litter box daily during your pregnancy.  o  Always wash your hands thoroughly after touching your cat or its belongings.             Over-the-Counter Medications Safe During Pregnancy  o   Headaches: Tylenol, Vitamin B2 (Riboflavin) twice daily, along with 400 mg Magnesium Oxide 1-2 times per day.  o   Allergies/Cold symptoms: Alavert, Benadryl, Cepacol, Chloraseptic lozenges and spray, Chlor-Trimeton, Claritin, cough drops, Hytuss, Mucinex (Plain or DM),  Rhinocort NS, Robitussin (Plain or DM), saline nasal spray, Tavist ND, Triaminic Allerchews, Vicks 44E, Vicks Vapor Rub, and Zyrtec.  o   Constipation: Colace/Stool Softeners (morning and afternoon), Metamucil, Milk of Magnesia, Miralax. Increasing water intake and eating green leafy vegetables and fruits can also help.  o   Diarrhea: Imodium and Kaopectate.  o   Heart Burn: Tums, Maalox, Mylanta, and Zantac (75 mg or 150 mg).  o   Hemorrhoids: Preparation H, Anusol, Tucks Medicated Pads, and witch hazel wipes.  o   Lice: Nix.  o   Muscle Aches/Pain: Vicks Vapor Rub and Tylenol.  o   Nausea/vomiting: Sea Bands, ginger chews, Vitamin B6 25 mg 3 times a day, and Unisom 1/2 to 1 tablet prior to bed-do not drive after taking.  o   Poison Ivy/Rash: Aveeno Oatmeal Bath, Benadryl, calamine lotion, and hydrocortisone cream.  o   Yeast Infection: Monistat 3 or 7.  **Avoid cold remedies containing alcohol and decongestants with pseudoephedrine or phenylephrine, as these can impact blood flow to the placenta.             Mental Health Support  Up to 1 in 5 of those who are pregnant and in the postpartum period will suffer from a maternal mental health disorder like postpartum depression (resource above from 20/20 Mom at FailLinks.tn).  Postpartum Depression Support Resources:  o  At website www.postpartum.net or by phone contact at [phone]  **For [place] and Spanish with other languages available upon request.  o  Chat with an expert for Moms every Wednesday and for Dads on the first Monday of every month [phone]  PSI Support for Families Facebook page:  o  http://www.https://www.patel.info/  The [employer] Maternal Mental Health HOTLINE:  o  ([phone] 431-212-1352) 24/7 available everyday call or text.

## 2023-05-11 NOTE — Nursing Note (Signed)
Advised patient that if they are interested in information on advance directives--they can request information through My Chart or pick up a packet at any [facility].

## 2023-05-11 NOTE — Patient Instructions (Signed)
It was wonderful to see you today for your first OB visit!  We will see you again in 4 weeks for a return visit.  I am attaching some useful information within this message for you reference.    Your Estimated Due Date is: Estimated Date of Delivery: 12/04/23  Your LMP was: Patient's last menstrual period was 02/27/2023.   Provider you saw in clinic today: Thea Alken, APRN,NP-C     Please feel free to use this message as a proof of pregnancy at [facility], Department of Health, etc.  We encourage you to sign up to see you if you qualify for [facility] Benefits!    Please let us know if you have any further questions or concerns!    Sincerely,  Thea Alken, APRN,NP-C           First Trimester Education  Recommended weight gain: 25-35 lbs  Foods to avoid: uncooked or undercooked meats, unpasteurized milks or cheeses, soft/ imported cheeses (like brie/ queso/ etc) unless they've been pasteurized (most of them have been, just check), lunch meats and hot dogs are fine as long as they're cooked until they're steaming, make sure you're washing fruits/ veggies really well, and avoid shark/ swordfish/ tilefish/ king mackerel.   For nausea: try small frequent meals, ginger chews/ candies, SeaBands (bracelets that put pressure on pressure points in the wrist that helps with nausea). You can also try Vitamin B6 (any dose- you can't really get too many B vitamins) 3 times per day with or without Unisom (Doxylamine) 1/2- 1 tab per day (make sure you only take it at night as it is a sleep aid)    Safe medications:             -Allergies/ Cold symptoms: Alavert, Benadryl, Cepacol, Chloraseptic Lozenges or spray, Chlortrimeton, Claritin, Cough drops,    Hytuss, Mucinex (plain or D), Rhinocort NS, Robitussin (plain or D), saline nasal spray, Tavist ND, Triaminic Allerchews, Vicks 44E, Zyrtec            - Constipation: Colace, Metamucil, Milk of Magnesia            - Diarrhea: Imodium, Kaopectate            - Heartburn: Maalox, Mylanta,  Tums, Pepcid            - Hemorrhoids: Anusol, Preparation H, Tucks pads            - Itching: Coratid, Lanacort            - Lice: NIX            - Muscle Aches: Vicks VapoRub            - Nausea/ Vomiting: Vitamin B6/ Unisom, Sea Bands            - Pain: Tylenol            - Poison Ivy/ Rash: Aveeno Oatmeal Bath, Benadryl, Calamine Lotion,  Hydrocortisone Cream           - Yeast: Monistat 5, or 7 day treatment           - Nausea/ vomiting/ diarrhea: BRAT diet- Bananas, Rice, Applesauce, Toast           - No cold remedies with alcohol in them, no decongestants with pseudoephedrine or phenylephrine.       If you noticed that you have bleeding, please call the clinic during office hours, or the hospital if it is after hours (  [phone] (ask to speak to a provider on call at labor and delivery).   - We will usually try to get you in for an ultrasound during the day, and will likely have you do blood work to check your HCG level.      For Headache Management:  - Magnesium Oxide (MagOx) 400mg  Twice Per Day with Vitamin B2 25 mg (or any dose you can find) Twice Per Day    For Nausea Management:  - Vitamin B6 Three Times Per Day  - Unisom (Doxylamine Succinate) Over the Counter Sleep Aid 1/2 - 1 Tablet at Night  - Small meals, with a snack every 2 hours or so to keep something in your belly.  - Eat a simple carb (like crackers) when you first wake up.    ** If you are doing the nausea and headache regimens you can just get a B Complex vitamin that has B2 and B6 in it!!! :)      Pregnancy with COVID-19  We have limited data regarding COVID-19 and pregnancy. We recommend you get the most accurate, up to date information about COVID from these resources:  - Mother to Baby: MacauTaxes.hu  - CDC Website on COVID and Pregnancy/Breastfeeding: http://brown-davis.com/  - [facility] COVID "What You Need to Know":  https://craig.com/  - [facility] COVID: https://www.garcia.biz/.aspx  - [facility], Guidance on Cleaning When Caring for A Person at Home with COVID: OnSiteLending.nl    To Minimize Clinic Exposure for Staff and Patients, we have changed our typical prenatal care schedule during COVID-19.    11-13 Weeks  New OB Visit - In Person  Nuchal [place] (if desired)  16 Weeks  MyChart Visit  20 Weeks  Return OB Visit - In Person  Anatomy US  24 Weeks  MyChart visit  28 Weeks  Return OB Visit - In Person  28 Week Labs/Injections  30 Weeks  My Chart visit  32 Weeks  Return OB Visit    Follow Up Ultrasound (if indicated)  34 Weeks  Return OB Visit  36 Weeks  Return OB Visit    GBS Swab  37-39 Weeks  Return OB Visit     2wk Postpartum Telehealth Visit (if possible)  6wk Postpartum Telehealth Visit - MyChart Visit (unless you have a complication and need to be seen in person)

## 2023-05-12 LAB — CHLAMYDIA TRACHOMATIS/NEISSERIA GONORRHOEAE RNA, NAAT
CHLAMYDIA TRACHOMATIS RNA: NEGATIVE
NEISSERIA GONORRHEA GC RNA: NEGATIVE

## 2023-05-12 LAB — URINE CULTURE: URINE CULTURE: <10000

## 2023-05-17 ENCOUNTER — Other Ambulatory Visit: Payer: 59 | Attending: INTERNAL MEDICINE-ENDOCRINOLOGY-DIABETES AND METABOLISM | Admitting: Gynecology

## 2023-05-17 ENCOUNTER — Other Ambulatory Visit: Payer: Self-pay

## 2023-05-17 DIAGNOSIS — E66811 Obesity, class 1: Secondary | ICD-10-CM | POA: Insufficient documentation

## 2023-05-17 DIAGNOSIS — E039 Hypothyroidism, unspecified: Secondary | ICD-10-CM | POA: Insufficient documentation

## 2023-05-17 DIAGNOSIS — Z3490 Encounter for supervision of normal pregnancy, unspecified, unspecified trimester: Secondary | ICD-10-CM | POA: Insufficient documentation

## 2023-05-17 LAB — CBC
HCT: 40.3 % (ref 34.8–46.0)
HGB: 13.8 g/dL (ref 11.5–16.0)
MCH: 29.7 pg (ref 26.0–32.0)
MCHC: 34.2 g/dL (ref 31.0–35.5)
MCV: 86.7 fL (ref 78.0–100.0)
MPV: 11.1 fL (ref 8.7–12.5)
PLATELETS: 209 10*3/uL (ref 150–400)
RBC: 4.65 10*6/uL (ref 3.85–5.22)
RDW-CV: 13.1 % (ref 11.5–15.5)
WBC: 7.6 10*3/uL (ref 3.7–11.0)

## 2023-05-17 LAB — HEPATITIS B SURFACE ANTIGEN: HBV SURFACE ANTIGEN QUALITATIVE: NEGATIVE

## 2023-05-17 LAB — GLUCOSE TOLERANCE TEST (GTT), 1 HOUR
GLUCOLA LOT NUMBER: 1377706
GLUCOSE 1 HR POST DOSE: 94 mg/dL (ref 70–140)
VOLUME OF GLUCOLA GIVEN: 10 [oz_av]
WEIGHT OF DEXTROSE IN THE GLUCOLA GIVEN: 50 g

## 2023-05-17 LAB — ABO/RH AND ANTIBODY SCREEN
ABO/RH(D): A NEG
ANTIBODY SCREEN: NEGATIVE

## 2023-05-17 LAB — HIV1/HIV2 SCREEN, COMBINED ANTIGEN AND ANTIBODY: HIV SCREEN, COMBINED ANTIGEN & ANTIBODY: NEGATIVE

## 2023-05-17 LAB — HEPATITIS C ANTIBODY SCREEN WITH REFLEX TO HCV PCR: HCV ANTIBODY QUALITATIVE: NEGATIVE

## 2023-05-17 LAB — THYROID STIMULATING HORMONE (SENSITIVE TSH): TSH: 0.663 u[IU]/mL (ref 0.350–4.940)

## 2023-05-17 LAB — SYPHILIS SCREENING ALGORITHM WITH REFLEX, SERUM: SYPHILIS TP ANTIBODIES: NONREACTIVE

## 2023-05-18 LAB — RUBELLA VIRUS ANTIBODIES, IGG, SERUM: RUBELLA IGG QUALITATIVE: POSITIVE

## 2023-05-19 ENCOUNTER — Other Ambulatory Visit: Payer: Self-pay

## 2023-05-19 ENCOUNTER — Ambulatory Visit: Payer: 59 | Attending: Family

## 2023-05-19 DIAGNOSIS — Z8742 Personal history of other diseases of the female genital tract: Secondary | ICD-10-CM | POA: Insufficient documentation

## 2023-05-19 DIAGNOSIS — Z3490 Encounter for supervision of normal pregnancy, unspecified, unspecified trimester: Secondary | ICD-10-CM | POA: Insufficient documentation

## 2023-05-19 DIAGNOSIS — N926 Irregular menstruation, unspecified: Secondary | ICD-10-CM | POA: Insufficient documentation

## 2023-05-20 DIAGNOSIS — Z3491 Encounter for supervision of normal pregnancy, unspecified, first trimester: Secondary | ICD-10-CM

## 2023-05-20 DIAGNOSIS — Z3A11 11 weeks gestation of pregnancy: Secondary | ICD-10-CM

## 2023-05-20 DIAGNOSIS — N926 Irregular menstruation, unspecified: Secondary | ICD-10-CM

## 2023-05-20 DIAGNOSIS — Z8742 Personal history of other diseases of the female genital tract: Secondary | ICD-10-CM

## 2023-05-21 ENCOUNTER — Other Ambulatory Visit (HOSPITAL_BASED_OUTPATIENT_CLINIC_OR_DEPARTMENT_OTHER): Payer: 59

## 2023-06-09 NOTE — L&D Delivery Note (Signed)
 [facility]    Delivery Summary Note      Name: [name]   MRN: [id number]  DOB:  [birth year]  Admitted: 12/08/2023 11:27 AM       Pre-delivery diagnosis:    33 y.o. G4P4004 at [redacted]w[redacted]d gestation.   IOL BMI 40  Hx of GHTN  Hx of graves s/p ablation with hypothyroidism  RH negative    Post-delivery diagnosis:    Same plus delivery of a viable neonate.     Findings:           [name] just finished with epidural and endorsed increased pressure with urge to push. Found to be complete/+1 @ 1948. She progressed spontaneously to the second stage of labor and began spontaneous bearing down in supine position. Over 2 pushes progressed to a NSVD at 1956 of viable 3245g female infant in LOA position. SROM with delivery of fetal head for clear fluid. Loose nuchal cord x1. Head and shoulders delivered via somersault maneuver with ease and infant placed on maternal abdomen where she completed transition; dried and stimulated for a lusty cry. IV Pitocin  given by RN. Once cord stopped pulsating, double clamped and cut. Placenta delivered spontaneously via Keren at 2005, trailing membranes teased out with ring forceps. Placenta inspected and intact, 3 vessels, central insertion. Perineum and vaginal vault inspected, small midline vaginal laceration noted, offered to hold pressure vs place 1 stitch. Pt requested to hold pressure first. Hemostasis achieved, no stitches required. Fundus firm and midline. APGARS 8/9. EBL 200cc. Infant skin to skin and breastfeeding on demand. Mother and baby in stable condition, pleased with baby and birth.     Disposition:  Infant(s) admitted to floor (rooming in with mother).  Team Member Updated:SABRA    Delivery Information      IO Blood Loss   Intrapartum & Postpartum: 12/08/23 0756 - 12/08/23 2156    Delivery Admission: 12/08/23 0756 - 12/09/23 0537         Intrapartum & Postpartum Delivery Admission    EBL Hospital Encounter 200 mL 200 mL    Total  200 mL 200 mL                [name] [[id number]]      Labor Events    Preterm labor?: No  Antenatal steroids: None  Antibiotics received during labor?: No  Rupture date/time: 12/08/2023 1956  Rupture type: Spontaneous  Fluid color: Clear  Fluid odor: none  Labor type: Induced Onset of Labor  Induction: Oxytocin   Induction date/time: 12/08/2023 1629  Indications for induction: Other  Was labor allowed to proceed with plans for an attempted vaginal birth?: Yes  Labor complications: None              Labor Events    Preterm labor?: No  Antenatal steroids: None  Antibiotics received during labor?: No  Rupture date/time: 12/08/2023 1956  Rupture type: Spontaneous  Fluid color: Clear  Induction: Oxytocin   Induction date/time: 12/08/2023 1629  Indications for induction: Other  Labor complications: None              Labor Event Times    Dilation complete Date/Time: 12/08/2023 1948       Delivery Anesthesia    Method: Epidural       Assisted Delivery Details    Forceps attempted?: No  Vacuum extractor attempted?: No       Shoulder Dystocia Maneuvers    Shoulder dystocia present?: No  Lacerations      Episiotomy: None   Perineal lacerations: None      Vaginal laceration: Yes Repaired: No   Repair suture: None  Number of repair packets: 0       Delivery Information    Birth date/time: 12/08/2023 19:56  Sex: Female  Delivery type: Vaginal, Spontaneous  Complications: None       Newborn Presentation    Presentation: Vertex  Position: Left Occiput Anterior       Cord Information    Vessels: 3 Vessels  Complications: Nuchal  Nuchal intervention: reduced  Nuchal cord description: loose nuchal cord  Number of loops: 1  Delayed cord clamping?: Yes  Cord clamped date/time: 12/08/2023 20:00  Cord blood disposition: Lab, Cord Segment Sent  Gases sent?: No  Cord comments: somersault maneuver then cord reduced  Stem cell collection (by MD): No       Resuscitation    Method: Suctioning  Neonatal resuscitation comments: stimulation and bulb suction        Newborn  Apgars    Living status: Living      Skin color:    Heart rate:    Reflex Irrit:    Muscle tone:    Resp. effort:    Total:     1 Min:    0    2    2    2    2    8     5  Min:    1    2    2    2    2    9     10  Min:     15 Min:     20 Min:       Apgars assigned by: GRESH,RN       Skin to Skin    Skin to skin initiation: 12/08/2023 2000  Skin to skin with: Mother  Skin to skin end: 12/08/2023 2220  Breastfeeding initiated: 12/08/2023 20:37       Newborn Measurements    Weight: 3245 g  Length: 48.3 cm  Head circumference: 34.5 cm  Chest circumference: 33.5 cm  Abdominal girth: 31.5 cm       Placenta    Date/time: 12/08/2023 20:05  Removal: Spontaneous  Appearance: Intact, Keren   Disposition: Discarded, Lab       Other Delivery Procedures    Procedures: None       Delivery Providers      Delivering Clinician: Abram Medford, CNM   Provider Role   Heise, Amy, RN Registered Nurse   Alvie Ip, RN Baby Nurse   Jeannetta Kast, DO Anesthesiologist                   Labor Event Times    Dilation complete date/time: 12/08/2023 1948       Labor Length      2nd stage: 0h 34m   3rd stage: 0h 71m                 Medford Abram, CNM 12/09/2023, 05:37      Patient delivered independently by APRN per their scope of practice.   I was present on unit and available for consultation if indicated.     Kristan M Hornsby, MD

## 2023-06-11 ENCOUNTER — Encounter (HOSPITAL_BASED_OUTPATIENT_CLINIC_OR_DEPARTMENT_OTHER): Payer: Self-pay | Admitting: NURSE PRACTITIONER

## 2023-06-11 ENCOUNTER — Ambulatory Visit: Payer: 59 | Attending: NURSE PRACTITIONER | Admitting: NURSE PRACTITIONER

## 2023-06-11 ENCOUNTER — Other Ambulatory Visit: Payer: Self-pay

## 2023-06-11 ENCOUNTER — Ambulatory Visit (HOSPITAL_BASED_OUTPATIENT_CLINIC_OR_DEPARTMENT_OTHER): Payer: 59 | Admitting: Gynecology

## 2023-06-11 VITALS — BP 108/76 | Ht 66.0 in | Wt 244.3 lb

## 2023-06-11 DIAGNOSIS — Z8759 Personal history of other complications of pregnancy, childbirth and the puerperium: Secondary | ICD-10-CM

## 2023-06-11 DIAGNOSIS — Z3A14 14 weeks gestation of pregnancy: Secondary | ICD-10-CM | POA: Insufficient documentation

## 2023-06-11 DIAGNOSIS — Z3490 Encounter for supervision of normal pregnancy, unspecified, unspecified trimester: Secondary | ICD-10-CM

## 2023-06-11 DIAGNOSIS — E669 Obesity, unspecified: Secondary | ICD-10-CM | POA: Insufficient documentation

## 2023-06-11 DIAGNOSIS — Z8639 Personal history of other endocrine, nutritional and metabolic disease: Secondary | ICD-10-CM | POA: Insufficient documentation

## 2023-06-11 DIAGNOSIS — Z3482 Encounter for supervision of other normal pregnancy, second trimester: Secondary | ICD-10-CM

## 2023-06-11 LAB — PROTEIN/CREATININE RATIO, URINE, RANDOM
CREATININE RANDOM URINE: 42 mg/dL
PROTEIN RANDOM URINE: <7 mg/dL

## 2023-06-11 LAB — COMPREHENSIVE METABOLIC PANEL, NON-FASTING
ALBUMIN: 3.6 g/dL (ref 3.5–5.0)
ALKALINE PHOSPHATASE: 56 U/L (ref 40–110)
ALT (SGPT): 11 U/L (ref <31)
ANION GAP: 6 mmol/L (ref 4–13)
AST (SGOT): 14 U/L (ref 11–34)
BILIRUBIN TOTAL: 0.4 mg/dL (ref 0.3–1.3)
BUN/CREA RATIO: 6 (ref 6–22)
BUN: 4 mg/dL — ABNORMAL LOW (ref 8–25)
CALCIUM: 9.3 mg/dL (ref 8.6–10.2)
CHLORIDE: 105 mmol/L (ref 96–111)
CO2 TOTAL: 22 mmol/L (ref 22–30)
CREATININE: 0.64 mg/dL (ref 0.60–1.05)
ESTIMATED GFR - FEMALE: >90 mL/min/BSA (ref >=60)
GLUCOSE: 105 mg/dL (ref 65–125)
POTASSIUM: 4.1 mmol/L (ref 3.7–5.3)
PROTEIN TOTAL: 6.7 g/dL (ref 6.0–7.9)
SODIUM: 133 mmol/L — ABNORMAL LOW (ref 136–145)

## 2023-06-11 NOTE — Progress Notes (Signed)
[place] Department of Obstetric & Gynecology    RETURN OBSTETRICAL ENCOUNTER    [name] is a here for ROB visit [redacted]w[redacted]d.   Pregnancy is complicated by pre-pregnancy BMI 39, graves hypothyroid, and history of gHTN    Denies the presence of: LOF, vaginal bleeding, cramping/contractions, N/V, HA, visual disturbances, and epigastric pain.      Today she has concerns regarding: nausea and vomiting only when brushing teeth    Dating Summary    Working EDD: 12/04/2023 set by Leandrew Koyanagi, RN on 05/11/2023 based on Last Menstrual Period on 02/27/2023   Based On EDD GA Diff User Date    Last Menstrual Period on 02/27/2023 12/04/2023 Working Leandrew Koyanagi, RN 05/11/2023                 History  OB History       Gravida   4    Para   3    Term   3    Preterm   0    AB   0    Living   3         SAB   0    IAB   0    Ectopic   0    Multiple   0    Live Births   3           Obstetric Comments   No history of any SAB, AB or Ectopic Pregnancies.        Patient stated that her BP goes up toward middle of pregnancy and end if it does.    2015 G1--No history of any PTL, PTD, PROM, Gest DM, Gest HTN, Preeclampsia, PP Depression, PP Hemorrhage or seen  by High Risk.  No induction.  Daughter did well after delivery.  Did have a little Baby Blues and no med.  2021  G2--No history of any PTL, PTD, PROM, Gest DM, Preeclampsia, PP Depression, PP Hemorrhage or seen by High Risk.  Patient was induced due to  Gest HTN (no med during pregnancy and not sure if had Magnesium during labor, no med after discharge).  Did have some Baby blues and no med.  [name] and readmitted for jaundice and had phototherapy and then did well.  Had Graves and watch ed for antibodies.  2023  G3--No history of any PTL, PTD, PROM, Gest DM, Gest HTN, Preeclampsia, PP Depression, PP Hemorrhage or seen by High Risk.  No induction.  Patient did have baby blues and no med.  [name] and did well after deliv  ery.  G4--current pregnancy--Nausea and some vomiting when she brushes her teeth and will have nausea in late evening when she is working.  After that she is able to keep food and fluids down.  OTC Recommendations Reviewed with patient.   Had sex and nex t morning had some bleeding when voided and did not have to wear pad and none since.  Will have an occasional cramp that is very mild and goes away quickly at bottom of her abdomen.   Reviewed S/Sx's with patient to go to the ER for prior to her appt wit h provider or if office is closed and to report to provider once they see her for her NOB appt.                 Past Medical History:   Diagnosis Date    COVID     Family history  of breast cancer in first degree relative     patient's mom and she is unsure of BRCA gene    Gestational hypertension 11/01/2019    Graves disease     PCOS (polycystic ovarian syndrome)     Personal history of other medical treatment     patient did have some baby blues after each daughter and no med needed    Seasonal allergic reaction     occasional OTC meds    SVD (spontaneous vaginal delivery) 07/08/2021    [name] presented to L&D on the morning of 07/07/2020 in early active labor with contractions that had increased in frequency and intensity over night.  She progressed without augmentation to SROM at 1525 and to SVD over a small vaginal laceration at 1602.  Baby girl, "[name]" weighed 3150 gm and had APGARS of 8-9.  Vaginal laceration was repaired under local anesthetic with 3.0 Vicryl.  EBL 250 mL.           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 and was feeling low she  is currently taking 5000 units    ferrous sulfate (FERATAB) 324 mg (65 mg iron) Oral Tablet, Delayed Release (E.C.) Take 1 Tablet (324 mg total) by mouth Every other day Recommended by her doctor     PNV Combo #19-Iron-Fol Ac-DHA 22-6-1-200 mg Oral Capsule Take 1 Cap by mouth Once a day (Patient taking differently: Take 1 Capsule by mouth Once a day Olli Essential PNV now and will be switching to [place] made with Folic Acid and DHA)    UNITHROID 112 mcg Oral Tablet Take 1 Tablet (112 mcg total) by mouth Every morning (Patient taking differently: Take 1 Tablet (112 mcg total) by mouth Every morning 5 days a week taking 112 mcg and then Sat and Sun taking 224 mcg.)     No Known Allergies    Weight: 111 kg (244 lb 4.3 oz)  BP (Non-Invasive): 108/76  # of Fetuses: 1  Preterm Labor: None  FHR (1): 140-150s      General: A&O x4; in no acute distress, appears well  Respiratory: respirations unlabored; respiratory rate regular  Cardiovascular: appears well perfused   Extremities: extremities normal, atraumatic, no cyanosis or edema.   Psych: Oriented, affect appropriate, appropriate responses.      Assessment:     IUP at [redacted]w[redacted]d  Rh negative  cfDNA deferred  Early 1hr GTT 94      ICD-10-CM    1. Supervision of normal pregnancy  Z34.90 OBG US [phone] DETAILED ANATOMY     COMPREHENSIVE METABOLIC PANEL, NON-FASTING     PROTEIN/CREATININE RATIO, URINE, RANDOM     CREATININE WITH EGFR      2. [redacted] weeks gestation of pregnancy  Z3A.14 OBG US [phone] DETAILED ANATOMY      3. History of Graves' disease  Hypothyroidism  Z86.39 [place] [address] DETAILED ANATOMY      4. History of gestational hypertension  Z87.59 OBG US [phone] DETAILED ANATOMY     COMPREHENSIVE METABOLIC PANEL, NON-FASTING     PROTEIN/CREATININE RATIO, URINE, RANDOM     CREATININE WITH EGFR      5. Obesity (BMI 35.0-39.9 without comorbidity)  E66.9 [place] [address] DETAILED ANATOMY        Plan:     Anatomy ultrasound ordered  Warning signs, precautions, reasons to call reviewed.   Anticipatory guidance given for future appointments  ROB in 4 wks    Riko Lumsden America Brown, FNP-C

## 2023-06-18 ENCOUNTER — Encounter (HOSPITAL_BASED_OUTPATIENT_CLINIC_OR_DEPARTMENT_OTHER): Payer: Self-pay | Admitting: Family

## 2023-06-18 ENCOUNTER — Encounter (HOSPITAL_BASED_OUTPATIENT_CLINIC_OR_DEPARTMENT_OTHER): Payer: Self-pay | Admitting: NURSE PRACTITIONER

## 2023-06-27 ENCOUNTER — Encounter (HOSPITAL_BASED_OUTPATIENT_CLINIC_OR_DEPARTMENT_OTHER): Payer: Self-pay | Admitting: Family

## 2023-07-02 ENCOUNTER — Encounter (HOSPITAL_BASED_OUTPATIENT_CLINIC_OR_DEPARTMENT_OTHER): Payer: Self-pay | Admitting: Family

## 2023-07-06 ENCOUNTER — Ambulatory Visit: Payer: 59 | Attending: NURSE PRACTITIONER

## 2023-07-06 ENCOUNTER — Other Ambulatory Visit: Payer: Self-pay

## 2023-07-06 DIAGNOSIS — Z3A14 14 weeks gestation of pregnancy: Secondary | ICD-10-CM | POA: Insufficient documentation

## 2023-07-06 DIAGNOSIS — Z8639 Personal history of other endocrine, nutritional and metabolic disease: Secondary | ICD-10-CM | POA: Insufficient documentation

## 2023-07-06 DIAGNOSIS — E669 Obesity, unspecified: Secondary | ICD-10-CM | POA: Insufficient documentation

## 2023-07-06 DIAGNOSIS — Z8759 Personal history of other complications of pregnancy, childbirth and the puerperium: Secondary | ICD-10-CM | POA: Insufficient documentation

## 2023-07-06 DIAGNOSIS — Z3490 Encounter for supervision of normal pregnancy, unspecified, unspecified trimester: Secondary | ICD-10-CM | POA: Insufficient documentation

## 2023-07-07 ENCOUNTER — Other Ambulatory Visit (HOSPITAL_BASED_OUTPATIENT_CLINIC_OR_DEPARTMENT_OTHER): Payer: Self-pay | Admitting: NURSE PRACTITIONER

## 2023-07-07 DIAGNOSIS — O99212 Obesity complicating pregnancy, second trimester: Secondary | ICD-10-CM

## 2023-07-07 DIAGNOSIS — Z8759 Personal history of other complications of pregnancy, childbirth and the puerperium: Secondary | ICD-10-CM

## 2023-07-07 DIAGNOSIS — E669 Obesity, unspecified: Secondary | ICD-10-CM

## 2023-07-07 DIAGNOSIS — Z3401 Encounter for supervision of normal first pregnancy, first trimester: Secondary | ICD-10-CM

## 2023-07-07 DIAGNOSIS — Z3A18 18 weeks gestation of pregnancy: Secondary | ICD-10-CM

## 2023-07-07 DIAGNOSIS — Z8639 Personal history of other endocrine, nutritional and metabolic disease: Secondary | ICD-10-CM

## 2023-07-09 ENCOUNTER — Encounter (HOSPITAL_BASED_OUTPATIENT_CLINIC_OR_DEPARTMENT_OTHER): Payer: Self-pay

## 2023-07-09 ENCOUNTER — Ambulatory Visit: Payer: 59

## 2023-07-09 ENCOUNTER — Ambulatory Visit (HOSPITAL_BASED_OUTPATIENT_CLINIC_OR_DEPARTMENT_OTHER): Payer: 59 | Admitting: Gynecology

## 2023-07-09 ENCOUNTER — Other Ambulatory Visit: Payer: Self-pay

## 2023-07-09 VITALS — BP 130/84 | Ht 66.0 in | Wt 244.3 lb

## 2023-07-09 DIAGNOSIS — Z8639 Personal history of other endocrine, nutritional and metabolic disease: Secondary | ICD-10-CM | POA: Insufficient documentation

## 2023-07-09 DIAGNOSIS — Z3482 Encounter for supervision of other normal pregnancy, second trimester: Secondary | ICD-10-CM

## 2023-07-09 DIAGNOSIS — Z3A18 18 weeks gestation of pregnancy: Secondary | ICD-10-CM

## 2023-07-09 DIAGNOSIS — Z3689 Encounter for other specified antenatal screening: Secondary | ICD-10-CM

## 2023-07-09 LAB — THYROID STIMULATING HORMONE WITH FREE T4 REFLEX: TSH: 0.379 u[IU]/mL (ref 0.350–4.940)

## 2023-07-09 NOTE — Progress Notes (Signed)
Department of Obstetrics & Gynecology  Return Obstetrics Visit    Name: [name]  MRN: [id number]  Date: 07/09/23      [name] is a here for ROB visit at [redacted]w[redacted]d.    Starting to feels some flutters  She is not experiencing cramping, contractions, LOF, vaginal bleeding, HA, visual changes.    She is feeling ok but has no appetite or cravings with this pregnancy. Notes mild nausea has continued into the second trimester. Has not tried any medications to help with this. Is concerned about weight gain. Reviewed weight gain of 2 lbs and expected weight gain for pregnancy between 10-15lbs. nEncouraged use of TUMS and starting B6 and unisom daily. Also encouraged small frequent snacks and encouraged protein intake.      Her Pregnancy is Significant for:  Patient Active Problem List    Diagnosis Date Noted    History of gestational hypertension 05/11/2023     With second pregnancy.   Encouraged lsASA      Pregnancy 05/29/2019     Screening:  Pap: 2022     Initial prenatal labs:  Date: 05/17/2023  Rh status:  H/H: 13.8/40.3  Rubella: Imm  HIV: neg  Hep B: neg  Hep C: neg  RPR:   G/C:   HgA1C:      Genetic screening:  First trimester:                Horizon:                 Panorama:  AFP: second trimester  CF:      Ultrasounds  Anatomy:  Follow up if indicated:       Tdap:    28 week labs:                H/H:                1 hr GTT:                RPR:                 3 hr GTT if indicated    RH status:                  Date rhogam given if indicated      GBS:   to be collected around 36 weeks             History of Graves' disease  Hypothyroidism 05/29/2019     S/p ablation in 2018  Dr Ronny Flurry manages medication adjustments    S/p MFM consult  Plan for growth [place] at 28, 32, and 36 weeks.  Ultrasound 09/26/19 growth in the 73rd percentile  Ultrasound 10/25/2019 growth in the 62nd percentile  Continue weekly testing with NST and fluid check  Plan for delivery at 39-[redacted] weeks  gestation    Will follow above plan for this pregnancy.  AT NOB  Synthroid 112 mcg 6 days, then 168 mcg x 2 for one day    12/17/20:  TSH = 2.542, T4 = 1.06 (continue current dose)      Obesity  PCOS 05/29/2019     Prepregnancy BMI 39          History  OB History       Gravida   4    Para   3    Term   3    Preterm   0    AB  0    Living   3         SAB   0    IAB   0    Ectopic   0    Multiple   0    Live Births   3           Obstetric Comments   No history of any SAB, AB or Ectopic Pregnancies.        Patient stated that her BP goes up toward middle of pregnancy and end if it does.    2015 G1--No history of any PTL, PTD, PROM, Gest DM, Gest HTN, Preeclampsia, PP Depression, PP Hemorrhage or seen  by High Risk.  No induction.  Daughter did well after delivery.  Did have a little Baby Blues and no med.  2021  G2--No history of any PTL, PTD, PROM, Gest DM, Preeclampsia, PP Depression, PP Hemorrhage or seen by High Risk.  Patient was induced due to  Gest HTN (no med during pregnancy and not sure if had Magnesium during labor, no med after discharge).  Did have some Baby blues and no med.  [name] and readmitted for jaundice and had phototherapy and then did well.  Had Graves and watch ed for antibodies.  2023  G3--No history of any PTL, PTD, PROM, Gest DM, Gest HTN, Preeclampsia, PP Depression, PP Hemorrhage or seen by High Risk.  No induction.  Patient did have baby blues and no med.  [name] and did well after deliv ery.  G4--current pregnancy--Nausea and some vomiting when she brushes her teeth and will have nausea in late evening when she is working.  After that she is able to keep food and fluids down.  OTC Recommendations Reviewed with patient.   Had sex and nex t morning had some bleeding when voided and did not have to wear pad and none since.  Will have an occasional cramp that is very mild and goes away quickly at bottom of her abdomen.   Reviewed S/Sx's with patient to go to  the ER for prior to her appt wit h provider or if office is closed and to report to provider once they see her for her NOB appt.                 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 and was feeling low she  is currently taking 5000 units    ferrous sulfate (FERATAB) 324 mg (65 mg iron) Oral Tablet, Delayed Release (E.C.) Take 1 Tablet (324 mg total) by mouth Every other day Recommended by her doctor    PNV Combo #19-Iron-Fol Ac-DHA 22-6-1-200 mg Oral Capsule Take 1 Cap by mouth Once a day (Patient taking differently: Take 1 Capsule by mouth Once a day Olli Essential PNV now and will be switching to [place] made with Folic Acid and DHA)    UNITHROID 112 mcg Oral Tablet Take 1 Tablet (112  mcg total) by mouth Every morning (Patient taking differently: Take 1 Tablet (112 mcg total) by mouth Every morning 5 days a week taking 112 mcg and then Sat and Sun taking 224 mcg.)       Objective  Weight: 111 kg (244 lb 4.3 oz)  BP (Non-Invasive): 130/84  # of Fetuses: 1  Fundal Height: S=D  FHR (1): 130's    General: No apparent distress  Respiratory: Easy, unlabored breathing.   Skin: Appropriate color for race. Warm, dry, intact.  Abdomen: Gravid, soft, nontender.  Edema: none    Assessment:     1 IUP at [redacted]w[redacted]d  2 Hx of Graves, hypothyroidism  3 Pregravid BMI 39    Plan:     Follow up anatomy ultrasound in 4 weeks to complete anatomy  TSH today- medication changes managed by endocrine  Warning signs, precautions, reasons to call reviewed.   ROB in 4 wks    Timothy Lasso, CNM

## 2023-07-09 NOTE — Patient Instructions (Signed)
Hello Momoko Slezak Ponti     It was wonderful to see you in the clinic today for your [redacted]w[redacted]d return OB visit.  Your order is placed for your anatomy ultrasound. The front desk should be able to help you schedule this for you. We recommend completing your anatomy ultrasound between 18 and 20 weeks.     We will see you again in 4 weeks.      If you have any non-urgent questions or concerns you can send them via MyChart. Please keep in mind that most messages go to our nurses in the clinic to triage/review. These messages are not routinely checked on holidays or over the weekend. For urgent concerns, you should call the clinic that you are seen at. After hours, on holidays, or on the weekends you should call Labor and Delivery Triage at [phone] and requesting to the operator to speak to someone on labor and delivery, or by contacting the MARS line [phone].      How to Present to Triage    In a severe, acute situation you should be seen at your closest medical facility.    If you are asked to come to triage (or to "L&D" / "labor and delivery"), you should go to [facility].    If you are coming for evaluation for anything on the list below, please call ahead to  to notify our front desk you are coming, if possible.    Our new hospital is open!!!! It is at the same address ([address]), but is on the far right of the campus. Look for the colorful building that says "[facility]"  Our Labor and Delivery unit is now called "[facility]". It is on the 8th floor of the [facility].   To enter for labor/birth concerns, please enter through the main entrance, and they will direct you upstairs.   Please call [phone] if you have any non-labor concerns for further instructions on how to enter for triage evaluation.     For non-labor concerns (a fall where you hit your head, a car accident, a dog bite) I would consider going to the main  Emergency Department.        When to Notify your Provider    During pregnancy it is important that you maintain open communications with your health care provider in order to assure a safe and comfortable pregnancy.  Most of your questions and concerns about normal changes and discomforts will be answered during your routine office visits or during regular office hours.                          There are a few symptoms; however, that your provider will want to know about as soon as they occur.  They could indicate a need for evaluation and treatment in order to prevent problems.  Call your doctor or midwife immediately and you will be advised whether or not your condition requires immediate attention.  If you are unable to contact your doctor, go to the OB floor at the hospital immediately.       Vaginal Bleeding - please be able to describe the amount (quarter-size, filled a pantiliner,  of maxipad) and color (bright red, brown)   Decrease in fetal activity. (If you notice this you can drink something cold, preferably water, rest, and eat a snack. If movement does not pick up in the hour you  should come in).   Severe pain or cramping in the abdomen unrelieved by bowel movement  More than 5 contractions or abdominal tightenings in 1 hour if before [redacted] weeks gestation  Strong contractions every 5 minutes for 1 to 2 hours if [redacted] weeks gestation or more  Sudden gush of water from the vagina that does not smell like urine (it is common to leak a small amount of urine when sneezing, coughing or vomiting)  The fluid will continue to leak after the initial gush.   Very frequent voiding or burning with urination  Persistent severe headache (especially with vision change), not relieved by rest, Tylenol, and adequate hydration.  Severe nausea and vomiting with inability to keep down fluids for a day  Chills and fever over 100.4 with cold or flu symptoms  Persistent and severe back pain (mild and moderate back pain can be a  normal finding in pregnancy for most people)    Timothy Lasso, CNM

## 2023-07-12 ENCOUNTER — Encounter (HOSPITAL_BASED_OUTPATIENT_CLINIC_OR_DEPARTMENT_OTHER): Payer: Self-pay | Admitting: INTERNAL MEDICINE-ENDOCRINOLOGY-DIABETES AND METABOLISM

## 2023-07-13 ENCOUNTER — Encounter (HOSPITAL_BASED_OUTPATIENT_CLINIC_OR_DEPARTMENT_OTHER): Payer: Self-pay | Admitting: INTERNAL MEDICINE-ENDOCRINOLOGY-DIABETES AND METABOLISM

## 2023-07-14 MED ORDER — UNITHROID 112 MCG TABLET
ORAL_TABLET | ORAL | 3 refills | Status: DC
Start: 2023-07-14 — End: 2023-07-19

## 2023-07-15 ENCOUNTER — Encounter (HOSPITAL_BASED_OUTPATIENT_CLINIC_OR_DEPARTMENT_OTHER): Payer: Self-pay | Admitting: INTERNAL MEDICINE-ENDOCRINOLOGY-DIABETES AND METABOLISM

## 2023-07-16 ENCOUNTER — Other Ambulatory Visit (HOSPITAL_BASED_OUTPATIENT_CLINIC_OR_DEPARTMENT_OTHER): Payer: Self-pay | Admitting: INTERNAL MEDICINE-ENDOCRINOLOGY-DIABETES AND METABOLISM

## 2023-07-19 MED ORDER — UNITHROID 112 MCG TABLET
ORAL_TABLET | ORAL | 3 refills | Status: DC
Start: 2023-07-19 — End: 2024-02-14

## 2023-07-19 NOTE — Telephone Encounter (Signed)
Unithroid reordered on 07/14/2023 quantity of 108 tablets and 3 refills

## 2023-07-19 NOTE — Telephone Encounter (Signed)
RX approved and encounter closed

## 2023-07-19 NOTE — Telephone Encounter (Signed)
Message from [place] B sent at 07/19/2023  9:35 AM EST    Summary: Clinical Question    Copied From CRM [id number].  Valma Cava MELISSA called with a clinical question.        Dr Ronny Flurry,        Celene Kras 112 mcg Oral Tablet    Pt says she was told by her pharmacy to reach out to [place] regarding this Rx.  She says she keeps running out and doesn't think they're giving her the correct dosage.  Please call pt to advise. Thank you!                Call History    Contact Date/Time Type Contact Identity Validated Phone/Fax By   07/19/2023 09:32 AM EST Phone (Incoming) Kau, Christiane Ha DOB, Phone, Address [phone]       Per 05/17/23 result note - , 1x 5 days, 2x 2 days. Rx sent on 2/5 is correct    Donnie Mesa, RN  07/19/2023, 10:24

## 2023-07-19 NOTE — Addendum Note (Signed)
Addended by: Uvaldo Bristle on: 07/19/2023 10:26 AM     Modules accepted: Orders

## 2023-07-24 ENCOUNTER — Encounter (HOSPITAL_BASED_OUTPATIENT_CLINIC_OR_DEPARTMENT_OTHER): Payer: Self-pay | Admitting: INTERNAL MEDICINE-ENDOCRINOLOGY-DIABETES AND METABOLISM

## 2023-07-26 ENCOUNTER — Encounter (HOSPITAL_BASED_OUTPATIENT_CLINIC_OR_DEPARTMENT_OTHER): Payer: Self-pay

## 2023-07-28 ENCOUNTER — Ambulatory Visit (HOSPITAL_BASED_OUTPATIENT_CLINIC_OR_DEPARTMENT_OTHER): Payer: Self-pay | Admitting: Advanced Practice Midwife

## 2023-08-04 ENCOUNTER — Other Ambulatory Visit: Payer: Self-pay

## 2023-08-04 ENCOUNTER — Ambulatory Visit: Payer: 59 | Attending: NURSE PRACTITIONER

## 2023-08-04 DIAGNOSIS — Z3401 Encounter for supervision of normal first pregnancy, first trimester: Secondary | ICD-10-CM | POA: Insufficient documentation

## 2023-08-05 ENCOUNTER — Encounter (HOSPITAL_COMMUNITY): Payer: Self-pay

## 2023-08-05 DIAGNOSIS — Z3402 Encounter for supervision of normal first pregnancy, second trimester: Secondary | ICD-10-CM

## 2023-08-05 DIAGNOSIS — Z3A22 22 weeks gestation of pregnancy: Secondary | ICD-10-CM

## 2023-08-06 ENCOUNTER — Ambulatory Visit (HOSPITAL_BASED_OUTPATIENT_CLINIC_OR_DEPARTMENT_OTHER)

## 2023-08-06 ENCOUNTER — Ambulatory Visit: Payer: Self-pay | Attending: CERTIFIED NURSE MIDWIFE | Admitting: CERTIFIED NURSE MIDWIFE

## 2023-08-06 ENCOUNTER — Encounter (HOSPITAL_BASED_OUTPATIENT_CLINIC_OR_DEPARTMENT_OTHER): Payer: Self-pay | Admitting: CERTIFIED NURSE MIDWIFE

## 2023-08-06 ENCOUNTER — Ambulatory Visit (HOSPITAL_BASED_OUTPATIENT_CLINIC_OR_DEPARTMENT_OTHER): Payer: Self-pay | Admitting: NURSE PRACTITIONER

## 2023-08-06 ENCOUNTER — Other Ambulatory Visit: Payer: Self-pay

## 2023-08-06 VITALS — BP 132/86 | HR 100 | Temp 98.4°F | Wt 237.7 lb

## 2023-08-06 DIAGNOSIS — Z3490 Encounter for supervision of normal pregnancy, unspecified, unspecified trimester: Secondary | ICD-10-CM | POA: Insufficient documentation

## 2023-08-06 DIAGNOSIS — Z8759 Personal history of other complications of pregnancy, childbirth and the puerperium: Secondary | ICD-10-CM | POA: Insufficient documentation

## 2023-08-06 DIAGNOSIS — Z3482 Encounter for supervision of other normal pregnancy, second trimester: Secondary | ICD-10-CM

## 2023-08-06 DIAGNOSIS — E039 Hypothyroidism, unspecified: Secondary | ICD-10-CM | POA: Insufficient documentation

## 2023-08-06 DIAGNOSIS — Z3A22 22 weeks gestation of pregnancy: Secondary | ICD-10-CM | POA: Insufficient documentation

## 2023-08-06 NOTE — Progress Notes (Signed)
 Department of Obstetrics & Gynecology  Return Obstetrics Visit    Name: [name]  MRN: [id number]  Date: 08/06/23      Iram Astorino Kraai is 33 y.o. [id number] who presents for ROB visit at [redacted]w[redacted]d.    + fetal movement.  Reports feeling some increased vaginal pressure last night when got up quickly from seated position - had been seated for over an hour. Resolved spontaneously  Denies cramping, contractions, LOF, vaginal bleeding, HA, visual changes.    Reports continued nausea. Vomiting 1-2x day. Using Unisom and B6.   Reports some increased worrying about things in the future. Feels like this may be elevating her BP. Has cuff at home, reports now usually running around 130's/80's. Reports no one has talked to her about taking LDASA     Antenatal course significant for:  Patient Active Problem List   Diagnosis    Rh negative state in antepartum period    Pregnancy    History of Graves' disease  Hypothyroidism    Obesity  PCOS    History of gestational hypertension        History  OB History       Gravida   4    Para   3    Term   3    Preterm   0    AB   0    Living   3         SAB   0    IAB   0    Ectopic   0    Multiple   0    Live Births   3           Obstetric Comments   No history of any SAB, AB or Ectopic Pregnancies.        Patient stated that her BP goes up toward middle of pregnancy and end if it does.    2015 G1--No history of any PTL, PTD, PROM, Gest DM, Gest HTN, Preeclampsia, PP Depression, PP Hemorrhage or seen  by High Risk.  No induction.  Daughter did well after delivery.  Did have a little Baby Blues and no med.  2021  G2--No history of any PTL, PTD, PROM, Gest DM, Preeclampsia, PP Depression, PP Hemorrhage or seen by High Risk.  Patient was induced due to  Gest HTN (no med during pregnancy and not sure if had Magnesium during labor, no med after discharge).  Did have some Baby blues and no med.  [name] and readmitted for jaundice and had phototherapy  and then did well.  Had Graves and watch ed for antibodies.  2023  G3--No history of any PTL, PTD, PROM, Gest DM, Gest HTN, Preeclampsia, PP Depression, PP Hemorrhage or seen by High Risk.  No induction.  Patient did have baby blues and no med.  [name] and did well after deliv ery.  G4--current pregnancy--Nausea and some vomiting when she brushes her teeth and will have nausea in late evening when she is working.  After that she is able to keep food and fluids down.  OTC Recommendations Reviewed with patient.   Had sex and nex t morning had some bleeding when voided and did not have to wear pad and none since.  Will have an occasional cramp that is very mild and goes away quickly at bottom of her abdomen.   Reviewed S/Sx's with patient to go to the ER for prior to her appt  wit h provider or if office is closed and to report to provider once they see her for her NOB appt.                 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 and was feeling low she  is currently taking 5000 units    Doxylamine Succinate, Sleep, (UNISOM, DOXYLAMINE,) 25 mg Oral Tablet Take 1 Tablet (25 mg total) by mouth Once a day    ferrous sulfate (FERATAB) 324 mg (65 mg iron) Oral Tablet, Delayed Release (E.C.) Take 1 Tablet (324 mg total) by mouth Every other day Recommended by her doctor    magnesium citrate (CITROMA) Oral Solution Take 296 mL by mouth One time    PNV Combo #19-Iron-Fol Ac-DHA 22-6-1-200 mg Oral  Capsule Take 1 Cap by mouth Once a day (Patient taking differently: Take 1 Capsule by mouth Once a day Olli Essential PNV now and will be switching to [place] made with Folic Acid and DHA)    pyridoxine, vitamin B6, (VITAMIN B6) 50 mg Oral Tablet Take 1 Tablet (50 mg total) by mouth Once a day    UNITHROID 112 mcg Oral Tablet Take 1 tablet (112 mcg) 5 days a week and then Sat and Sun take 2 tablets (224 mcg).       Objective  Weight: 108 kg (237 lb 10.5 oz)  BP (Non-Invasive): 132/86  Fundal Height: 24  Fetal Movement: Present  Edema: Negative  FHR (1): 140    General: No apparent distress  Respiratory: Easy, unlabored breathing.   Skin:  Warm, dry, intact.  Abdomen: Gravid, soft, nontender.      Assessment:     (Z34.90) Pregnancy, unspecified gestational age  (primary encounter diagnosis)    (Z87.59) History of gestational hypertension    (Z3A.22) [redacted] weeks gestation of pregnancy    (E03.9) Hypothyroidism, unspecified type  Plan: THYROID STIMULATING HORMONE (SENSITIVE TSH)       Plan:     Reviewed gradual position changes. Advised to report if increased vaginal pressure returns and persists or is accompanied by vb, cramping, lof.  Zofran offered and declined. Aware can call/message for RX at any time  Warning signs, precautions, reasons to call reviewed.   Recommended LDASA daily. Has BP cuff at home. Reviewed proper method for measuring accurate BP. Advised to immediately report any readings > 140/90. Pre-e warning sxs reviewed in detail  TSH ordered  [facility] referral offered and declined. Aware of resources and can get referral at any point  Reviewed f/u US - wnl  ROB in 4 wks or prn      Clydie Braun Lisbeth Ply, PhD, DNP, CNM

## 2023-08-08 ENCOUNTER — Encounter (HOSPITAL_BASED_OUTPATIENT_CLINIC_OR_DEPARTMENT_OTHER): Payer: Self-pay | Admitting: NURSE PRACTITIONER

## 2023-08-09 ENCOUNTER — Ambulatory Visit: Payer: Self-pay

## 2023-08-09 DIAGNOSIS — Z0000 Encounter for general adult medical examination without abnormal findings: Secondary | ICD-10-CM

## 2023-08-09 LAB — THYROID STIMULATING HORMONE (SENSITIVE TSH): TSH: 1.025 u[IU]/mL (ref 0.350–4.940)

## 2023-08-11 ENCOUNTER — Encounter (HOSPITAL_BASED_OUTPATIENT_CLINIC_OR_DEPARTMENT_OTHER): Payer: Self-pay | Admitting: Family

## 2023-08-11 ENCOUNTER — Ambulatory Visit (HOSPITAL_BASED_OUTPATIENT_CLINIC_OR_DEPARTMENT_OTHER): Payer: Self-pay

## 2023-08-11 NOTE — Telephone Encounter (Addendum)
 Returned pt call. Told pt that her MyChart message was sent to Manley Mason and Manley Mason had responded to her via MyChart.    Pt states that she would like her message forwarded to Manley Mason, as she is unable to directly send a message to her on MyChart.    Told pt that her MyChart response will be forwarded to Clydie Braun.     Asked pt if I can assist her with anything at this time. Pt reports anxiety, but does not want to take prescription medication. Highly advised the ED for thoughts of self harm/harm to others.    Pt verbalized understanding.  Oretha Caprice, RN      Regarding: Clinical Question  ----- Message from Tennis Must sent at 08/11/2023  2:23 PM EST -----  Copied From CRM [id number].  [name], Katie Audia () called with a clinical question.     Faulk pt     Pt called and stated    She had an appt with Manley Mason and she was very helpful, she was going to message her on [facility] but her name doesn't show up.    Pt would like to ask Clydie Braun a follow up question and is asking if Clydie Braun could message her.    Thank you

## 2023-08-24 ENCOUNTER — Encounter (HOSPITAL_BASED_OUTPATIENT_CLINIC_OR_DEPARTMENT_OTHER): Payer: Self-pay | Admitting: Family

## 2023-08-24 ENCOUNTER — Other Ambulatory Visit: Payer: Self-pay

## 2023-08-24 ENCOUNTER — Ambulatory Visit: Payer: Self-pay | Attending: Family | Admitting: Family

## 2023-08-24 VITALS — BP 122/80 | Ht 66.0 in | Wt 240.7 lb

## 2023-08-24 DIAGNOSIS — Z3482 Encounter for supervision of other normal pregnancy, second trimester: Secondary | ICD-10-CM

## 2023-08-24 DIAGNOSIS — Z8759 Personal history of other complications of pregnancy, childbirth and the puerperium: Secondary | ICD-10-CM | POA: Insufficient documentation

## 2023-08-24 DIAGNOSIS — Z8639 Personal history of other endocrine, nutritional and metabolic disease: Secondary | ICD-10-CM | POA: Insufficient documentation

## 2023-08-24 DIAGNOSIS — Z3490 Encounter for supervision of normal pregnancy, unspecified, unspecified trimester: Secondary | ICD-10-CM | POA: Insufficient documentation

## 2023-08-24 DIAGNOSIS — Z3A25 25 weeks gestation of pregnancy: Secondary | ICD-10-CM | POA: Insufficient documentation

## 2023-08-24 NOTE — Progress Notes (Signed)
 Patient seen by Thea Alken, APRN,NP-C    [name] is a here for ROB visit [redacted]w[redacted]d.  She is here with her daughter today.   Baby is moving well.  Denies the presence of: LOF, vaginal bleeding, cramping/contractions, N/V, HA, visual disturbances, and epigastric pain.      Report she is doing well.   She states she was experiencing some issues with mood but she is feeling much better now.     Dating Summary    Working EDD: 12/04/2023 set by Leandrew Koyanagi, RN on 05/11/2023 based on Last Menstrual Period on 02/27/2023   Based On EDD [place] Diff User Date    Last Menstrual Period on 02/27/2023 12/04/2023 Working Leandrew Koyanagi, RN 05/11/2023                 History  OB History       Gravida   4    Para   3    Term   3    Preterm   0    AB   0    Living   3         SAB   0    IAB   0    Ectopic   0    Multiple   0    Live Births   3           Obstetric Comments   No history of any SAB, AB or Ectopic Pregnancies.        Patient stated that her BP goes up toward middle of pregnancy and end if it does.    2015 G1--No history of any PTL, PTD, PROM, Gest DM, Gest HTN, Preeclampsia, PP Depression, PP Hemorrhage or seen  by High Risk.  No induction.  Daughter did well after delivery.  Did have a little Baby Blues and no med.  2021  G2--No history of any PTL, PTD, PROM, Gest DM, Preeclampsia, PP Depression, PP Hemorrhage or seen by High Risk.  Patient was induced due to  Gest HTN (no med during pregnancy and not sure if had Magnesium during labor, no med after discharge).  Did have some Baby blues and no med.  Daughter's name is [name] and readmitted for jaundice and had phototherapy and then did well.  Had Graves and watch ed for antibodies.  2023  G3--No history of any PTL, PTD, PROM, Gest DM, Gest HTN, Preeclampsia, PP Depression, PP Hemorrhage or seen by High Risk.  No induction.  Patient did have baby blues and no med.  Daughter's name is [name] and did well after deliv ery.  G4--current  pregnancy--Nausea and some vomiting when she brushes her teeth and will have nausea in late evening when she is working.  After that she is able to keep food and fluids down.  OTC Recommendations Reviewed with patient.   Had sex and nex t morning had some bleeding when voided and did not have to wear pad and none since.  Will have an occasional cramp that is very mild and goes away quickly at bottom of her abdomen.   Reviewed S/Sx's with patient to go to the ER for prior to her appt wit h provider or if office is closed and to report to provider once they see her for her NOB appt.                 Past Medical History:   Diagnosis Date    COVID  Family history of breast cancer in first degree relative     patient's mom and she is unsure of BRCA gene    Gestational hypertension 11/01/2019    Graves disease     PCOS (polycystic ovarian syndrome)     Personal history of other medical treatment     patient did have some baby blues after each daughter and no med needed    Seasonal allergic reaction     occasional OTC meds    SVD (spontaneous vaginal delivery) 07/08/2021    [name] presented to L&D on the morning of 07/07/2020 in early active labor with contractions that had increased in frequency and intensity over night.  She progressed without augmentation to SROM at 1525 and to SVD over a small vaginal laceration at 1602.  Baby girl, "[name]" weighed 3150 gm and had APGARS of 8-9.  Vaginal laceration was repaired under local anesthetic with 3.0 Vicryl.  EBL 250 mL.         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 and was feeling low she  is currently taking 5000 units    Doxylamine Succinate, Sleep, (UNISOM, DOXYLAMINE,) 25 mg Oral Tablet Take 1 Tablet (25 mg total) by mouth  Once a day    ferrous sulfate (FERATAB) 324 mg (65 mg iron) Oral Tablet, Delayed Release (E.C.) Take 1 Tablet (324 mg total) by mouth Every other day Recommended by her doctor    magnesium citrate (CITROMA) Oral Solution Take 296 mL by mouth One time    PNV Combo #19-Iron-Fol Ac-DHA 22-6-1-200 mg Oral Capsule Take 1 Cap by mouth Once a day (Patient taking differently: Take 1 Capsule by mouth Once a day Olli Essential PNV now and will be switching to [place] made with Folic Acid and DHA)    pyridoxine, vitamin B6, (VITAMIN B6) 50 mg Oral Tablet Take 1 Tablet (50 mg total) by mouth Once a day    UNITHROID 112 mcg Oral Tablet Take 1 tablet (112 mcg) 5 days a week and then Sat and Sun take 2 tablets (224 mcg).     No Known Allergies    Weight: 109 kg (240 lb 11.9 oz)  BP (Non-Invasive): 122/80  # of Fetuses: 1  Preterm Labor: None  Fetal Movement: Present  Edema: Negative  FHR (1): 140s    General: A&O x4; in no acute distress, appears well  Respiratory: respirations unlabored; respiratory rate regular  Cardiovascular: appears well perfused   Extremities: extremities normal, atraumatic, no cyanosis or edema.   Psych: Oriented, affect appropriate, appropriate responses.      Assessment:     IUP at [redacted]w[redacted]d  Rh negative  Prepregnancy BMI 39  Hx of GHTN with prior pregnancy  Hx of Graves Disease      ICD-10-CM    1. [redacted] weeks gestation of pregnancy  Z3A.25       2. Pregnancy, unspecified gestational age  Z2.90 SYPHILIS DUAL SCREENING ALGORITHM WITH REFLEX (TITER, TP-PA), SERUM     ANTIBODY SCREEN     CBC/DIFF     GLUCOSE TOLERANCE TEST (GTT), 1 HOUR     THYROID STIMULATING HORMONE WITH FREE T4 REFLEX     PROTEIN/CREATININE RATIO, URINE, RANDOM  CREATININE WITH EGFR      3. History of Graves' disease  Hypothyroidism  Z86.39 THYROID STIMULATING HORMONE WITH FREE T4 REFLEX      4. History of gestational hypertension  Z87.59 PROTEIN/CREATININE RATIO, URINE, RANDOM     CREATININE WITH EGFR          Plan:     Labs ordered and  reviewed with patient.   Warning signs, precautions, reasons to call reviewed.   ROB in 3 wks    Thea Alken, APRN,NP-C

## 2023-08-26 ENCOUNTER — Encounter (HOSPITAL_BASED_OUTPATIENT_CLINIC_OR_DEPARTMENT_OTHER): Payer: Self-pay | Admitting: Family

## 2023-09-06 ENCOUNTER — Ambulatory Visit: Attending: Family | Admitting: Gynecology

## 2023-09-06 ENCOUNTER — Other Ambulatory Visit: Payer: Self-pay

## 2023-09-06 DIAGNOSIS — Z8639 Personal history of other endocrine, nutritional and metabolic disease: Secondary | ICD-10-CM | POA: Insufficient documentation

## 2023-09-06 DIAGNOSIS — Z3490 Encounter for supervision of normal pregnancy, unspecified, unspecified trimester: Secondary | ICD-10-CM | POA: Insufficient documentation

## 2023-09-06 DIAGNOSIS — Z8759 Personal history of other complications of pregnancy, childbirth and the puerperium: Secondary | ICD-10-CM | POA: Insufficient documentation

## 2023-09-06 LAB — PROTEIN/CREATININE RATIO, URINE, RANDOM
CREATININE RANDOM URINE: 19 mg/dL
PROTEIN RANDOM URINE: <7 mg/dL

## 2023-09-06 LAB — CBC WITH DIFF
BASOPHIL #: <0.1 10*3/uL (ref <=0.20)
BASOPHIL %: 0.3 %
EOSINOPHIL #: 0.27 10*3/uL (ref <=0.50)
EOSINOPHIL %: 2.5 %
HCT: 35 % (ref 34.8–46.0)
HGB: 12.3 g/dL (ref 11.5–16.0)
IMMATURE GRANULOCYTE #: 0.18 10*3/uL — ABNORMAL HIGH (ref <0.10)
IMMATURE GRANULOCYTE %: 1.7 % — ABNORMAL HIGH (ref 0.0–1.0)
LYMPHOCYTE #: 1.96 10*3/uL (ref 1.00–4.80)
LYMPHOCYTE %: 18.4 %
MCH: 31.5 pg (ref 26.0–32.0)
MCHC: 35.1 g/dL (ref 31.0–35.5)
MCV: 89.7 fL (ref 78.0–100.0)
MONOCYTE #: 0.69 10*3/uL (ref 0.20–1.10)
MONOCYTE %: 6.5 %
MPV: 10.6 fL (ref 8.7–12.5)
NEUTROPHIL #: 7.51 10*3/uL (ref 1.50–7.70)
NEUTROPHIL %: 70.6 %
PLATELETS: 173 10*3/uL (ref 150–400)
RBC: 3.9 10*6/uL (ref 3.85–5.22)
RDW-CV: 13.8 % (ref 11.5–15.5)
WBC: 10.6 10*3/uL (ref 3.7–11.0)

## 2023-09-06 LAB — CREATININE WITH EGFR
CREATININE: 0.53 mg/dL — ABNORMAL LOW (ref 0.60–1.05)
ESTIMATED GFR - FEMALE: >90 mL/min/BSA (ref >=60)

## 2023-09-06 LAB — SYPHILIS SCREENING ALGORITHM WITH REFLEX, SERUM: SYPHILIS TP ANTIBODIES: NONREACTIVE

## 2023-09-06 LAB — ANTIBODY SCREEN: ANTIBODY SCREEN: NEGATIVE

## 2023-09-06 LAB — THYROID STIMULATING HORMONE WITH FREE T4 REFLEX: TSH: 1.088 u[IU]/mL (ref 0.350–4.940)

## 2023-09-06 LAB — GLUCOSE TOLERANCE TEST (GTT), 1 HOUR
GLUCOLA LOT NUMBER: 183957
GLUCOSE 1 HR POST DOSE: 104 mg/dL (ref 70–134)
VOLUME OF GLUCOLA GIVEN: 10 [oz_av]
WEIGHT OF DEXTROSE IN THE GLUCOLA GIVEN: 50 g

## 2023-09-07 ENCOUNTER — Encounter (HOSPITAL_BASED_OUTPATIENT_CLINIC_OR_DEPARTMENT_OTHER): Payer: Self-pay | Admitting: Family

## 2023-09-21 ENCOUNTER — Ambulatory Visit (HOSPITAL_BASED_OUTPATIENT_CLINIC_OR_DEPARTMENT_OTHER): Payer: Self-pay | Admitting: Advanced Practice Midwife

## 2023-09-22 ENCOUNTER — Other Ambulatory Visit: Payer: Self-pay

## 2023-09-22 ENCOUNTER — Ambulatory Visit: Payer: Self-pay | Attending: Obstetrics & Gynecology | Admitting: Obstetrics & Gynecology

## 2023-09-22 VITALS — BP 130/77 | HR 95 | Ht 66.0 in | Wt 246.0 lb

## 2023-09-22 DIAGNOSIS — O99213 Obesity complicating pregnancy, third trimester: Secondary | ICD-10-CM | POA: Insufficient documentation

## 2023-09-22 DIAGNOSIS — Z9889 Other specified postprocedural states: Secondary | ICD-10-CM | POA: Insufficient documentation

## 2023-09-22 DIAGNOSIS — Z6791 Unspecified blood type, Rh negative: Secondary | ICD-10-CM | POA: Insufficient documentation

## 2023-09-22 DIAGNOSIS — Z3490 Encounter for supervision of normal pregnancy, unspecified, unspecified trimester: Secondary | ICD-10-CM

## 2023-09-22 DIAGNOSIS — E669 Obesity, unspecified: Secondary | ICD-10-CM | POA: Insufficient documentation

## 2023-09-22 DIAGNOSIS — O99283 Endocrine, nutritional and metabolic diseases complicating pregnancy, third trimester: Secondary | ICD-10-CM | POA: Insufficient documentation

## 2023-09-22 DIAGNOSIS — E039 Hypothyroidism, unspecified: Secondary | ICD-10-CM | POA: Insufficient documentation

## 2023-09-22 DIAGNOSIS — Z2913 Encounter for prophylactic Rho(D) immune globulin: Secondary | ICD-10-CM | POA: Insufficient documentation

## 2023-09-22 DIAGNOSIS — Z3A29 29 weeks gestation of pregnancy: Secondary | ICD-10-CM | POA: Insufficient documentation

## 2023-09-22 DIAGNOSIS — Z23 Encounter for immunization: Secondary | ICD-10-CM | POA: Insufficient documentation

## 2023-09-22 DIAGNOSIS — Z3483 Encounter for supervision of other normal pregnancy, third trimester: Secondary | ICD-10-CM

## 2023-09-22 DIAGNOSIS — O26893 Other specified pregnancy related conditions, third trimester: Secondary | ICD-10-CM | POA: Insufficient documentation

## 2023-09-22 MED ORDER — RHO(D) IMMUNE GLOBULIN 1,500 UNIT (300 MCG) INTRAMUSCULAR SYRINGE
300.0000 ug | INJECTION | INTRAMUSCULAR | Status: DC
Start: 2023-09-22 — End: 2023-09-22

## 2023-09-22 NOTE — Progress Notes (Signed)
  Department of Obstetric & Gynecology      RETURN OBSTETRICAL ENCOUNTER    PATIENT: [name]  CHART NUMBER: [id number]  DATE OF SERVICE: 09/22/2023    Subjective:  33 y.o. G4P3003 at [redacted]w[redacted]d presenting for ROB visit.     Pt reports doing well today with no complaints/concerns.  Denies contractions/cramping. No LOF or VB. + fetal movement. Denies CP, SOB, HA, vision changes, RUQ pain, N/V, changes in peripheral edema.     Dating Summary    Working EDD: 12/04/2023 set by Leandrew Koyanagi, RN on 05/11/2023 based on Last Menstrual Period on 02/27/2023   Based On EDD GA Diff User Date    Last Menstrual Period on 02/27/2023 12/04/2023 Working Leandrew Koyanagi, RN 05/11/2023                 Objective:  BP 130/77   Pulse 95   Ht 1.676 m (5\' 6" )   Wt 112 kg (246 lb 0.5 oz)   LMP 02/27/2023   BMI 39.71 kg/m     General: NAD  Chest: RRR, even non labored breathing on RA  Abdomen: Soft, gravid, + BS, NTTP  Extremities: no edema, no calf tenderness    FHT: 150s  Fundal height: 30 cm     Urine Dip:   Weight: 112 kg (246 lb 0.5 oz)  BP (Non-Invasive): 130/77    A/P:  33 y.o. G4P3003 at [redacted]w[redacted]d here for routine OB care. Problem list updated as below:     - Rhogam and TDAP today   - breech by [place]     Patient Active Problem List    Diagnosis Date Noted    History of gestational hypertension 05/11/2023     With second pregnancy.   Encouraged lsASA  Baseline labs wnl      Pregnancy 05/29/2019     Screening:  Pap: 2022     Initial prenatal labs:  Date: 05/17/2023  Rh status: NEG  H/H: 13.8/40.3  Rubella: Imm  HIV: neg  Hep B: neg  Hep C: neg  RPR: NR  G/C:  neg  HgA1C:      Genetic screening:  Declines      Ultrasounds  Anatomy: 07/06/23: Fetal weight is at the  16th percentile ,   appropriate for gestational age with normal amniotic   fluid volume. Anatomy is within normal to the extent of   visualization, with exception of limitations below.   Anterior  placenta      Limitations include: suboptimal  posterior fossa, palate,   suboptimal nose/lips, suboptimal 4 chamber, crossing,   RVOT, 3VTV, aortic arch, bicaval, left fingers, spine,   genitalia     Follow up if indicated: 2/26: estimated fetal weight equals 503 grams which is   at the 36th percentile, and the abdominal   circumference is at the 29th percentile, complete and normal      Tdap:    28 week labs:                H/H: 12.3/35                1 hr GTT: 104                RPR:                 3 hr GTT if indicated    RH status:  Date rhogam given if indicated      GBS:   to be collected around 36 weeks             History of Graves' disease  Hypothyroidism 05/29/2019     S/p ablation in 2018  Dr Alyssa Jumper manages medication adjustments        Obesity  PCOS 05/29/2019     Prepregnancy BMI 39      Rh negative state in antepartum period 10/28/2013         RTC 2 week       Suzzette Eth, MD  09/22/2023, 15:15

## 2023-09-22 NOTE — Patient Instructions (Signed)
 If you have any non-urgent questions or concerns you can send them via MyChart. Please keep in mind that most messages go to our nurses in the clinic to triage/review. These messages are not routinely checked on holidays or over the weekend. For urgent concerns, you should call the clinic that you are seen at. After hours, on holidays, or on the weekends you should call Labor and Delivery Triage at [phone], or by contacting the [facility] line [phone].        How to Present to Triage     In a severe, acute situation you should be seen at your closest medical facility.     If you are asked to come to triage (or to "L&D" / "labor and delivery"), you should go to [facility].     If you are coming for evaluation for anything on the list below, please call ahead to [phone] to notify our front desk you are coming, if possible.     The [facility] is on the 8th [facility]. You enter through the main front entrance of the [facility]. Please wear a mask as you enter the facility. You can bring a support person with you. The transport team will bring you upstairs to our unit. We will typically check you in at the front desk, take you to a triage room, place you on the electronic fetal monitor, and a member of the team will evaluate you.        When to Notify your Provider     During pregnancy it is important that you maintain open communications with your health care provider in order to assure a safe and comfortable pregnancy.  Most of your questions and concerns about normal changes and discomforts will be answered during your routine office visits or during regular office hours.                          There are a few symptoms; however, that your provider will want to know about sooner than your scheduled appointment.  They could indicate a need for evaluation and treatment in order to prevent problems.  Call your doctor or midwife and you will be advised whether or  not your condition requires immediate attention.  If you are unable to contact your doctor, go to the OB floor at the hospital for same-day evaluation.       Vaginal Bleeding - please be able to describe the amount (quarter-size, filled a pantiliner,  of maxipad) and color (bright red, brown)   Decrease in fetal activity. (If you notice this you can drink something cold, preferably water, rest, and eat a snack. If movement does not pick up in the hour you should come in).   Severe pain or cramping in the abdomen unrelieved by bowel movement  More than 5 contractions or abdominal tightenings in 1 hour if before [redacted] weeks gestation  Strong contractions every 5 minutes for 1 to 2 hours if [redacted] weeks gestation or more  Sudden gush of water from the vagina that does not smell like urine (it is common to leak a small amount of urine when sneezing, coughing or vomiting)  The fluid will continue to leak after the initial gush.   Very frequent voiding or burning with urination  Persistent severe headache (especially with vision change), not relieved by rest, Tylenol, and adequate hydration.  Severe nausea and vomiting with inability to keep down fluids  for a day  Chills and fever over 100.4 with cold or flu symptoms  Persistent and severe back pain (mild and moderate back pain can be a normal finding in pregnancy for most people)

## 2023-10-12 ENCOUNTER — Encounter (HOSPITAL_BASED_OUTPATIENT_CLINIC_OR_DEPARTMENT_OTHER): Payer: Self-pay | Admitting: Family

## 2023-10-12 ENCOUNTER — Other Ambulatory Visit: Payer: Self-pay

## 2023-10-12 ENCOUNTER — Ambulatory Visit: Payer: Self-pay | Attending: Family | Admitting: Family

## 2023-10-12 VITALS — BP 118/74 | Ht 66.0 in | Wt 249.1 lb

## 2023-10-12 DIAGNOSIS — Z3A32 32 weeks gestation of pregnancy: Secondary | ICD-10-CM | POA: Insufficient documentation

## 2023-10-12 DIAGNOSIS — O360130 Maternal care for anti-D [Rh] antibodies, third trimester, not applicable or unspecified: Secondary | ICD-10-CM

## 2023-10-12 DIAGNOSIS — Z8759 Personal history of other complications of pregnancy, childbirth and the puerperium: Secondary | ICD-10-CM

## 2023-10-12 DIAGNOSIS — Z3490 Encounter for supervision of normal pregnancy, unspecified, unspecified trimester: Secondary | ICD-10-CM | POA: Insufficient documentation

## 2023-10-12 NOTE — Progress Notes (Signed)
 Patient seen by Vic Grade, APRN,NP-C    [name] is a here for ROB visit [redacted]w[redacted]d.  She is here with her daughter today.   Baby is moving well.  Denies the presence of: LOF, vaginal bleeding, cramping/contractions, N/V, HA, visual disturbances, and epigastric pain.      Report she is doing well. Is concerned baby is still breech.     Dating Summary    Working EDD: 12/04/2023 set by Katie Matsu, RN on 05/11/2023 based on Last Menstrual Period on 02/27/2023   Based On EDD GA Diff User Date    Last Menstrual Period on 02/27/2023 12/04/2023 Working Katie Matsu, RN 05/11/2023                 History  OB History       Gravida   4    Para   3    Term   3    Preterm   0    AB   0    Living   3         SAB   0    IAB   0    Ectopic   0    Multiple   0    Live Births   3           Obstetric Comments   No history of any SAB, AB or Ectopic Pregnancies.        Patient stated that her BP goes up toward middle of pregnancy and end if it does.    2015 G1--No history of any PTL, PTD, PROM, Gest DM, Gest HTN, Preeclampsia, PP Depression, PP Hemorrhage or seen  by High Risk.  No induction.  Daughter did well after delivery.  Did have a little Baby Blues and no med.  2021  G2--No history of any PTL, PTD, PROM, Gest DM, Preeclampsia, PP Depression, PP Hemorrhage or seen by High Risk.  Patient was induced due to  Gest HTN (no med during pregnancy and not sure if had Magnesium during labor, no med after discharge).  Did have some Baby blues and no med.  Daughter's name is [name] and readmitted for jaundice and had phototherapy and then did well.  Had Graves and watch ed for antibodies.  2023  G3--No history of any PTL, PTD, PROM, Gest DM, Gest HTN, Preeclampsia, PP Depression, PP Hemorrhage or seen by High Risk.  No induction.  Patient did have baby blues and no med.  Daughter's name is [name] and did well after deliv ery.  G4--current pregnancy--Nausea and some vomiting when she brushes her teeth and  will have nausea in late evening when she is working.  After that she is able to keep food and fluids down.  OTC Recommendations Reviewed with patient.   Had sex and nex t morning had some bleeding when voided and did not have to wear pad and none since.  Will have an occasional cramp that is very mild and goes away quickly at bottom of her abdomen.   Reviewed S/Sx's with patient to go to the ER for prior to her appt wit h provider or if office is closed and to report to provider once they see her for her NOB appt.                 Past Medical History:   Diagnosis Date    COVID     Family history of breast cancer in first degree relative  patient's mom and she is unsure of BRCA gene    Gestational hypertension 11/01/2019    Graves disease     PCOS (polycystic ovarian syndrome)     Personal history of other medical treatment     patient did have some baby blues after each daughter and no med needed    Seasonal allergic reaction     occasional OTC meds    SVD (spontaneous vaginal delivery) 07/08/2021    [name] presented to L&D on the morning of 07/07/2020 in early active labor with contractions that had increased in frequency and intensity over night.  She progressed without augmentation to SROM at 1525 and to SVD over a small vaginal laceration at 1602.  Baby girl, "[name]" weighed 3150 gm and had APGARS of 8-9.  Vaginal laceration was repaired under local anesthetic with 3.0 Vicryl.  EBL 250 mL.         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 and was feeling low she  is currently taking 5000 units    Doxylamine Succinate, Sleep, (UNISOM, DOXYLAMINE,) 25 mg Oral Tablet Take 1 Tablet (25 mg total) by mouth Daily    ferrous sulfate (FERATAB) 324 mg (65 mg iron ) Oral Tablet, Delayed  Release (E.C.) Take 1 Tablet (324 mg total) by mouth Every other day Recommended by her doctor    magnesium citrate (CITROMA) Oral Solution Take 296 mL by mouth One time    PNV Combo #19-Iron -Fol Ac-DHA 22-6-1-200 mg Oral Capsule Take 1 Cap by mouth Once a day    pyridoxine, vitamin B6, (VITAMIN B6) 50 mg Oral Tablet Take 1 Tablet (50 mg total) by mouth Daily    UNITHROID  112 mcg Oral Tablet Take 1 tablet (112 mcg) 5 days a week and then Sat and Sun take 2 tablets (224 mcg).     No Known Allergies    Weight: 113 kg (249 lb 1.9 oz)  BP (Non-Invasive): 118/74  # of Fetuses: 1  Preterm Labor: None  Fetal Movement: Present  Presentation: Breech  Edema: Negative  FHR (1): 140s    General: A&O x4; in no acute distress, appears well  Respiratory: respirations unlabored; respiratory rate regular  Cardiovascular: appears well perfused   Extremities: extremities normal, atraumatic, no cyanosis or edema.   Psych: Oriented, affect appropriate, appropriate responses.      Assessment:     IUP at [redacted]w[redacted]d  Rh negative: Rhogam 09/22/2023  Prepregnancy BMI 39  Hx of GHTN with prior pregnancy  Hx of Graves Disease  Tdap: UTD      ICD-10-CM    1. [redacted] weeks gestation of pregnancy  Z3A.32       2. Pregnancy, unspecified gestational age  Z17.90 OBG US  [phone] Transabdominal Follow Up        Plan:     Follow up growth ultrasound ordered.   Warning signs, precautions, reasons to call reviewed.   ROB in 2 wks    Vic Grade, APRN,NP-C

## 2023-10-15 ENCOUNTER — Ambulatory Visit (HOSPITAL_BASED_OUTPATIENT_CLINIC_OR_DEPARTMENT_OTHER): Payer: Self-pay | Admitting: Family

## 2023-10-15 NOTE — Telephone Encounter (Signed)
 [redacted]w[redacted]d. Patient states that she is feeling decreased baby movement. Yesterday not a lot of movement and just different. She is unsure if there are 10 movements. Advised that she can try eating something sugary, drinking something cold and resting on her left side. Advised that if she does not get 10 movements within an hour or has concerns about movement still to go to L&D triage to be evaluated. Patient verbalized understanding. Nella Bame, RN

## 2023-10-26 ENCOUNTER — Ambulatory Visit: Payer: Self-pay | Admitting: Advanced Practice Midwife

## 2023-10-27 ENCOUNTER — Ambulatory Visit (HOSPITAL_BASED_OUTPATIENT_CLINIC_OR_DEPARTMENT_OTHER): Admitting: NURSE PRACTITIONER-WOMENS HEALTH

## 2023-10-27 ENCOUNTER — Encounter (HOSPITAL_BASED_OUTPATIENT_CLINIC_OR_DEPARTMENT_OTHER): Payer: Self-pay | Admitting: NURSE PRACTITIONER

## 2023-10-27 ENCOUNTER — Ambulatory Visit: Attending: NURSE PRACTITIONER | Admitting: NURSE PRACTITIONER

## 2023-10-27 ENCOUNTER — Other Ambulatory Visit: Payer: Self-pay

## 2023-10-27 VITALS — BP 114/72 | Ht 70.0 in | Wt 252.4 lb

## 2023-10-27 DIAGNOSIS — O360120 Maternal care for anti-D [Rh] antibodies, second trimester, not applicable or unspecified: Secondary | ICD-10-CM

## 2023-10-27 DIAGNOSIS — Z3A14 14 weeks gestation of pregnancy: Secondary | ICD-10-CM

## 2023-10-27 DIAGNOSIS — Z3A34 34 weeks gestation of pregnancy: Secondary | ICD-10-CM | POA: Insufficient documentation

## 2023-10-27 DIAGNOSIS — Z3490 Encounter for supervision of normal pregnancy, unspecified, unspecified trimester: Secondary | ICD-10-CM | POA: Insufficient documentation

## 2023-10-27 NOTE — Progress Notes (Signed)
 [place] Department of Obstetric & Gynecology    RETURN OBSTETRICAL ENCOUNTER    [name] is a here for ROB visit [redacted]w[redacted]d.   Pregnancy is complicated by rh negative state, pre-pregnancy BMI 39, graves hypothyroid, and history of gHTN    Baby is moving well  Denies the presence of: LOF, vaginal bleeding, cramping/contractions, N/V, HA, visual disturbances, and epigastric pain.      Today she has concerns regarding: discussed plan if she is still breech at next visit; desires PLTCS versus ECV.      Dating Summary    Working EDD: 12/04/2023 set by Katie Matsu, RN on 05/11/2023 based on Last Menstrual Period on 02/27/2023   Based On EDD GA Diff User Date    Last Menstrual Period on 02/27/2023 12/04/2023 Working Katie Matsu, RN 05/11/2023               History  OB History       Gravida   4    Para   3    Term   3    Preterm   0    AB   0    Living   3         SAB   0    IAB   0    Ectopic   0    Multiple   0    Live Births   3           Obstetric Comments   No history of any SAB, AB or Ectopic Pregnancies.        Patient stated that her BP goes up toward middle of pregnancy and end if it does.    2015 G1--No history of any PTL, PTD, PROM, Gest DM, Gest HTN, Preeclampsia, PP Depression, PP Hemorrhage or seen  by High Risk.  No induction.  Daughter did well after delivery.  Did have a little Baby Blues and no med.  2021  G2--No history of any PTL, PTD, PROM, Gest DM, Preeclampsia, PP Depression, PP Hemorrhage or seen by High Risk.  Patient was induced due to  Gest HTN (no med during pregnancy and not sure if had Magnesium during labor, no med after discharge).  Did have some Baby blues and no med.  Daughter's name is [name] and readmitted for jaundice and had phototherapy and then did well.  Had Graves and watch ed for antibodies.  2023  G3--No history of any PTL, PTD, PROM, Gest DM, Gest HTN, Preeclampsia, PP Depression, PP Hemorrhage or seen by High Risk.  No induction.  Patient did have  baby blues and no med.  Daughter's name is [name] and did well after deliv ery.  G4--current pregnancy--Nausea and some vomiting when she brushes her teeth and will have nausea in late evening when she is working.  After that she is able to keep food and fluids down.  OTC Recommendations Reviewed with patient.   Had sex and nex t morning had some bleeding when voided and did not have to wear pad and none since.  Will have an occasional cramp that is very mild and goes away quickly at bottom of her abdomen.   Reviewed S/Sx's with patient to go to the ER for prior to her appt wit h provider or if office is closed and to report to provider once they see her for her NOB appt.                 Past Medical  History:   Diagnosis Date    COVID     Family history of breast cancer in first degree relative     patient's mom and she is unsure of BRCA gene    Gestational hypertension 11/01/2019    Graves disease     PCOS (polycystic ovarian syndrome)     Personal history of other medical treatment     patient did have some baby blues after each daughter and no med needed    Seasonal allergic reaction     occasional OTC meds    SVD (spontaneous vaginal delivery) 07/08/2021    [name] presented to L&D on the morning of 07/07/2020 in early active labor with contractions that had increased in frequency and intensity over night.  She progressed without augmentation to SROM at 1525 and to SVD over a small vaginal laceration at 1602.  Baby girl, "[name]" weighed 3150 gm and had APGARS of 8-9.  Vaginal laceration was repaired under local anesthetic with 3.0 Vicryl.  EBL 250 mL.       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 and was feeling low she  is currently taking 5000 units    Doxylamine Succinate,  Sleep, (UNISOM, DOXYLAMINE,) 25 mg Oral Tablet Take 1 Tablet (25 mg total) by mouth Daily    ferrous sulfate (FERATAB) 324 mg (65 mg iron ) Oral Tablet, Delayed Release (E.C.) Take 1 Tablet (324 mg total) by mouth Every other day Recommended by her doctor    magnesium citrate (CITROMA) Oral Solution Take 296 mL by mouth One time    PNV Combo #19-Iron -Fol Ac-DHA 22-6-1-200 mg Oral Capsule Take 1 Cap by mouth Once a day    pyridoxine, vitamin B6, (VITAMIN B6) 50 mg Oral Tablet Take 1 Tablet (50 mg total) by mouth Daily    UNITHROID  112 mcg Oral Tablet Take 1 tablet (112 mcg) 5 days a week and then Sat and Sun take 2 tablets (224 mcg).     No Known Allergies    Weight: 115 kg (252 lb 6.8 oz)  BP (Non-Invasive): 114/72  # of Fetuses: 1  Fundal Height: 36  Preterm Labor: None  Fetal Movement: Present  FHR (1): 130-140s    General: A&O x4; in no acute distress, appears well  Respiratory: respirations unlabored; respiratory rate regular  Cardiovascular: appears well perfused   Extremities: extremities normal, atraumatic, no cyanosis or edema  Psych: Oriented, affect appropriate, appropriate responses    Assessment:     IUP at [redacted]w[redacted]d  Rh negative  cfDNA deferred  Early 1hr GTT 94  Anatomy scan  1hr GTT  S/P rhogam tdap      ICD-10-CM    1. [redacted] weeks gestation of pregnancy  Z3A.34       2. Supervision of normal pregnancy  Z34.90         Plan:     Warning signs, precautions, reasons to call reviewed.   Anticipatory guidance given for future appointments  Begin weekly NSTs at 37 weeks for BMI  ROB in 2 wks  GBS next visit    Follow up ultrasound tomorrow     Katie Barre, FNP-C

## 2023-10-28 ENCOUNTER — Ambulatory Visit: Payer: Self-pay | Attending: Family

## 2023-10-28 DIAGNOSIS — Z3490 Encounter for supervision of normal pregnancy, unspecified, unspecified trimester: Secondary | ICD-10-CM | POA: Insufficient documentation

## 2023-10-29 DIAGNOSIS — Z3A34 34 weeks gestation of pregnancy: Secondary | ICD-10-CM

## 2023-10-29 DIAGNOSIS — Z3493 Encounter for supervision of normal pregnancy, unspecified, third trimester: Secondary | ICD-10-CM

## 2023-11-01 ENCOUNTER — Encounter (HOSPITAL_BASED_OUTPATIENT_CLINIC_OR_DEPARTMENT_OTHER): Payer: Self-pay | Admitting: Family

## 2023-11-03 ENCOUNTER — Other Ambulatory Visit: Payer: Self-pay

## 2023-11-03 ENCOUNTER — Ambulatory Visit: Payer: Self-pay | Attending: NURSE PRACTITIONER | Admitting: NURSE PRACTITIONER

## 2023-11-03 ENCOUNTER — Encounter (HOSPITAL_BASED_OUTPATIENT_CLINIC_OR_DEPARTMENT_OTHER): Payer: Self-pay | Admitting: NURSE PRACTITIONER

## 2023-11-03 VITALS — BP 126/68 | Ht 70.0 in | Wt 253.7 lb

## 2023-11-03 DIAGNOSIS — N898 Other specified noninflammatory disorders of vagina: Secondary | ICD-10-CM | POA: Insufficient documentation

## 2023-11-03 DIAGNOSIS — Z3493 Encounter for supervision of normal pregnancy, unspecified, third trimester: Secondary | ICD-10-CM

## 2023-11-03 DIAGNOSIS — B3731 Acute candidiasis of vulva and vagina: Secondary | ICD-10-CM | POA: Insufficient documentation

## 2023-11-03 DIAGNOSIS — Z3A35 35 weeks gestation of pregnancy: Secondary | ICD-10-CM

## 2023-11-03 DIAGNOSIS — Z3490 Encounter for supervision of normal pregnancy, unspecified, unspecified trimester: Secondary | ICD-10-CM | POA: Insufficient documentation

## 2023-11-03 MED ORDER — TERCONAZOLE 0.4 % VAGINAL CREAM
1.0000 | TOPICAL_CREAM | Freq: Every evening | VAGINAL | 0 refills | Status: DC
Start: 2023-11-03 — End: 2023-12-08

## 2023-11-03 NOTE — Progress Notes (Signed)
 [facility]    RETURN OBSTETRICAL ENCOUNTER    [name] is a here for ROB visit [redacted]w[redacted]d.   Pregnancy is complicated by rh negative state, pre-pregnancy BMI 39, graves hypothyroid, and history of gHTN    Baby is moving well  Denies the presence of: LOF, vaginal bleeding, cramping/contractions, N/V, HA, visual disturbances, and epigastric pain.      Today she has concerns regarding: itching and discharge that began a week ago     Dating Summary    Working EDD: 12/04/2023 set by Jacqueline Matsu, RN on 05/11/2023 based on Last Menstrual Period on 02/27/2023   Based On EDD GA Diff User Date    Last Menstrual Period on 02/27/2023 12/04/2023 Working Jacqueline Matsu, RN 05/11/2023               History  OB History       Gravida   4    Para   3    Term   3    Preterm   0    AB   0    Living   3         SAB   0    IAB   0    Ectopic   0    Multiple   0    Live Births   3           Obstetric Comments   No history of any SAB, AB or Ectopic Pregnancies.        Patient stated that her BP goes up toward middle of pregnancy and end if it does.    2015 G1--No history of any PTL, PTD, PROM, Gest DM, Gest HTN, Preeclampsia, PP Depression, PP Hemorrhage or seen  by High Risk.  No induction.  Daughter did well after delivery.  Did have a little Baby Blues and no med.  2021  G2--No history of any PTL, PTD, PROM, Gest DM, Preeclampsia, PP Depression, PP Hemorrhage or seen by High Risk.  Patient was induced due to  Gest HTN (no med during pregnancy and not sure if had Magnesium during labor, no med after discharge).  Did have some Baby blues and no med.  [name] and readmitted for jaundice and had phototherapy and then did well.  Had Graves and watch ed for antibodies.  2023  G3--No history of any PTL, PTD, PROM, Gest DM, Gest HTN, Preeclampsia, PP Depression, PP Hemorrhage or seen by High Risk.  No induction.  Patient did have baby blues and no med.  Daughter's  name is [name] and did well after deliv ery.  G4--current pregnancy--Nausea and some vomiting when she brushes her teeth and will have nausea in late evening when she is working.  After that she is able to keep food and fluids down.  OTC Recommendations Reviewed with patient.   Had sex and nex t morning had some bleeding when voided and did not have to wear pad and none since.  Will have an occasional cramp that is very mild and goes away quickly at bottom of her abdomen.   Reviewed S/Sx's with patient to go to the ER for prior to her appt wit h provider or if office is closed and to report to provider once they see her for her NOB appt.                 Past Medical History:   Diagnosis Date  COVID     Family history of breast cancer in first degree relative     patient's mom and she is unsure of BRCA gene    Gestational hypertension 11/01/2019    Graves disease     PCOS (polycystic ovarian syndrome)     Personal history of other medical treatment     patient did have some baby blues after each daughter and no med needed    Seasonal allergic reaction     occasional OTC meds    SVD (spontaneous vaginal delivery) 07/08/2021    [name] presented to L&D on the morning of 07/07/2020 in early active labor with contractions that had increased in frequency and intensity over night.  She progressed without augmentation to SROM at 1525 and to SVD over a small vaginal laceration at 1602.  Baby girl, "[name]" weighed 3150 gm and had APGARS of 8-9.  Vaginal laceration was repaired under local anesthetic with 3.0 Vicryl.  EBL 250 mL.       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 and was feeling low she  is currently taking 5000 units    Doxylamine Succinate, Sleep, (UNISOM, DOXYLAMINE,) 25 mg  Oral Tablet Take 1 Tablet (25 mg total) by mouth Daily    ferrous sulfate (FERATAB) 324 mg (65 mg iron ) Oral Tablet, Delayed Release (E.C.) Take 1 Tablet (324 mg total) by mouth Every other day Recommended by her doctor    magnesium citrate (CITROMA) Oral Solution Take 296 mL by mouth One time    PNV Combo #19-Iron -Fol Ac-DHA 22-6-1-200 mg Oral Capsule Take 1 Cap by mouth Once a day    pyridoxine, vitamin B6, (VITAMIN B6) 50 mg Oral Tablet Take 1 Tablet (50 mg total) by mouth Daily    terconazole (TERAZOL 7) 0.4 % Vaginal Cream Insert 1 Applicator into the vagina Every night    UNITHROID  112 mcg Oral Tablet Take 1 tablet (112 mcg) 5 days a week and then Sat and Sun take 2 tablets (224 mcg).     No Known Allergies    Weight: 115 kg (253 lb 12 oz)  BP (Non-Invasive): 126/68  # of Fetuses: 1  Fundal Height: 35  Preterm Labor: None  Fetal Movement: Present  FHR (1): 130s    General: A&O x4; in no acute distress, appears well  Respiratory: respirations unlabored; respiratory rate regular  Cardiovascular: appears well perfused   Extremities: extremities normal, atraumatic, no cyanosis or edema  Psych: Oriented, affect appropriate, appropriate responses  Vagina: white chunky discharge noted with erythema to labia    Assessment:     IUP at [redacted]w[redacted]d  Rh negative  cfDNA deferred  Early 1hr GTT 94  Anatomy scan complete and normal  1hr GTT 104  S/P rhogam tdap      ICD-10-CM    1. [redacted] weeks gestation of pregnancy  Z3A.35 terconazole (TERAZOL 7) 0.4 % Vaginal Cream      2. Supervision of normal pregnancy  Z34.90       3. Vaginal discharge  N89.8 [place]      4. Vaginal yeast infection  B37.31 terconazole (TERAZOL 7) 0.4 % Vaginal Cream        Plan:     Warning signs, precautions,  reasons to call reviewed   Anticipatory guidance given for future appointments  Begin weekly NSTs at 37 weeks for BMI  Terazol to pharmacy for yeast infection   ROB in 1 wks    Annah Barre, FNP-C

## 2023-11-09 ENCOUNTER — Ambulatory Visit: Payer: Self-pay | Attending: Family | Admitting: Family

## 2023-11-09 ENCOUNTER — Other Ambulatory Visit: Payer: Self-pay

## 2023-11-09 ENCOUNTER — Encounter (HOSPITAL_BASED_OUTPATIENT_CLINIC_OR_DEPARTMENT_OTHER): Payer: Self-pay | Admitting: Family

## 2023-11-09 VITALS — BP 130/78 | Ht 70.0 in | Wt 250.2 lb

## 2023-11-09 DIAGNOSIS — Z3493 Encounter for supervision of normal pregnancy, unspecified, third trimester: Secondary | ICD-10-CM

## 2023-11-09 DIAGNOSIS — E669 Obesity, unspecified: Secondary | ICD-10-CM

## 2023-11-09 DIAGNOSIS — Z3A36 36 weeks gestation of pregnancy: Secondary | ICD-10-CM | POA: Insufficient documentation

## 2023-11-09 NOTE — Progress Notes (Signed)
 Patient seen by Vic Grade, APRN,NP-C    [name] is a here for ROB visit [redacted]w[redacted]d.  She is here with her daughter today.   Baby is moving well.  Denies the presence of: LOF, vaginal bleeding, cramping/contractions, N/V, HA, visual disturbances, and epigastric pain.      Report she is doing well. Feels like yeast infection symptoms are improving.     Dating Summary    Working EDD: 12/04/2023 set by Jacqueline Matsu, RN on 05/11/2023 based on Last Menstrual Period on 02/27/2023   Based On EDD GA Diff User Date    Last Menstrual Period on 02/27/2023 12/04/2023 Working Jacqueline Matsu, RN 05/11/2023                 History  OB History       Gravida   4    Para   3    Term   3    Preterm   0    AB   0    Living   3         SAB   0    IAB   0    Ectopic   0    Multiple   0    Live Births   3           Obstetric Comments   No history of any SAB, AB or Ectopic Pregnancies.        Patient stated that her BP goes up toward middle of pregnancy and end if it does.    2015 G1--No history of any PTL, PTD, PROM, Gest DM, Gest HTN, Preeclampsia, PP Depression, PP Hemorrhage or seen  by High Risk.  No induction.  Daughter did well after delivery.  Did have a little Baby Blues and no med.  2021  G2--No history of any PTL, PTD, PROM, Gest DM, Preeclampsia, PP Depression, PP Hemorrhage or seen by High Risk.  Patient was induced due to  Gest HTN (no med during pregnancy and not sure if had Magnesium during labor, no med after discharge).  Did have some Baby blues and no med.  [name] and readmitted for jaundice and had phototherapy and then did well.  Had Graves and watch ed for antibodies.  2023  G3--No history of any PTL, PTD, PROM, Gest DM, Gest HTN, Preeclampsia, PP Depression, PP Hemorrhage or seen by High Risk.  No induction.  Patient did have baby blues and no med.  [name] and did well after deliv ery.  G4--current pregnancy--Nausea and some vomiting when she brushes  her teeth and will have nausea in late evening when she is working.  After that she is able to keep food and fluids down.  OTC Recommendations Reviewed with patient.   Had sex and nex t morning had some bleeding when voided and did not have to wear pad and none since.  Will have an occasional cramp that is very mild and goes away quickly at bottom of her abdomen.   Reviewed S/Sx's with patient to go to the ER for prior to her appt wit h provider or if office is closed and to report to provider once they see her for her NOB appt.                 Past Medical History:   Diagnosis Date    COVID     Family history of breast cancer in first degree relative  patient's mom and she is unsure of BRCA gene    Gestational hypertension 11/01/2019    Graves disease     PCOS (polycystic ovarian syndrome)     Personal history of other medical treatment     patient did have some baby blues after each daughter and no med needed    Seasonal allergic reaction     occasional OTC meds    SVD (spontaneous vaginal delivery) 07/08/2021    [name] presented to L&D on the morning of 07/07/2020 in early active labor with contractions that had increased in frequency and intensity over night.  She progressed without augmentation to SROM at 1525 and to SVD over a small vaginal laceration at 1602.  Baby girl, "[name]" weighed 3150 gm and had APGARS of 8-9.  Vaginal laceration was repaired under local anesthetic with 3.0 Vicryl.  EBL 250 mL.         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 and was feeling low she  is currently taking 5000 units    Doxylamine Succinate, Sleep, (UNISOM, DOXYLAMINE,) 25 mg Oral Tablet Take 1 Tablet (25 mg total) by mouth Daily    ferrous sulfate (FERATAB) 324 mg (65 mg iron ) Oral Tablet, Delayed Release (E.C.) Take 1 Tablet (324 mg total) by mouth Every  other day Recommended by her doctor    magnesium citrate (CITROMA) Oral Solution Take 296 mL by mouth One time    PNV Combo #19-Iron -Fol Ac-DHA 22-6-1-200 mg Oral Capsule Take 1 Cap by mouth Once a day    pyridoxine, vitamin B6, (VITAMIN B6) 50 mg Oral Tablet Take 1 Tablet (50 mg total) by mouth Daily    terconazole  (TERAZOL 7 ) 0.4 % Vaginal Cream Insert 1 Applicator into the vagina Every night    UNITHROID  112 mcg Oral Tablet Take 1 tablet (112 mcg) 5 days a week and then Sat and Sun take 2 tablets (224 mcg).     No Known Allergies    Weight: 113 kg (250 lb 3.6 oz)  BP (Non-Invasive): 130/78  # of Fetuses: 1  Preterm Labor: None  Fetal Movement: Present  Presentation: Cephalic  Edema: Negative  FHR (1): 140s    General: A&O x4; in no acute distress, appears well  Respiratory: respirations unlabored; respiratory rate regular  Cardiovascular: appears well perfused   Extremities: extremities normal, atraumatic, no cyanosis or edema.   Psych: Oriented, affect appropriate, appropriate responses.      Assessment:     IUP at [redacted]w[redacted]d  Rh negative: Rhogam 09/22/2023  Prepregnancy BMI 39  Hx of GHTN with prior pregnancy  Hx of Graves Disease  Tdap: UTD      ICD-10-CM    1. [redacted] weeks gestation of pregnancy  Z3A.36 GROUP B STREPTOCOCCUS DNA BY PCR      2. Obesity (BMI 35.0-39.9 without comorbidity)  E66.9 62952 - FETAL NON STRESS TEST (AMB ONLY)        Plan:     GBS obtained today.   Will start weekly NSTs at 37 weeks.   Warning signs, precautions, reasons to call reviewed.   ROB in wk with NST    Vic Grade, APRN,NP-C

## 2023-11-11 LAB — GROUP B STREPTOCOCCUS DNA BY NAAT: GROUP B STREPTOCOCCUS (GBS) DNA BY NAAT: NEGATIVE

## 2023-11-16 ENCOUNTER — Encounter (HOSPITAL_BASED_OUTPATIENT_CLINIC_OR_DEPARTMENT_OTHER): Payer: Self-pay | Admitting: Advanced Practice Midwife

## 2023-11-16 ENCOUNTER — Other Ambulatory Visit: Payer: Self-pay

## 2023-11-16 ENCOUNTER — Ambulatory Visit: Payer: Self-pay | Attending: Advanced Practice Midwife | Admitting: Advanced Practice Midwife

## 2023-11-16 VITALS — BP 108/74 | Ht 66.0 in | Wt 250.9 lb

## 2023-11-16 DIAGNOSIS — O322XX0 Maternal care for transverse and oblique lie, not applicable or unspecified: Secondary | ICD-10-CM | POA: Insufficient documentation

## 2023-11-16 DIAGNOSIS — E669 Obesity, unspecified: Secondary | ICD-10-CM | POA: Insufficient documentation

## 2023-11-16 DIAGNOSIS — O09293 Supervision of pregnancy with other poor reproductive or obstetric history, third trimester: Secondary | ICD-10-CM

## 2023-11-16 DIAGNOSIS — Z3A37 37 weeks gestation of pregnancy: Secondary | ICD-10-CM | POA: Insufficient documentation

## 2023-11-16 DIAGNOSIS — E039 Hypothyroidism, unspecified: Secondary | ICD-10-CM | POA: Insufficient documentation

## 2023-11-16 NOTE — Progress Notes (Signed)
 Patient seen by Ralph Burke, CNM     [name] is a 33 y.o. [phone] here for routine antepartum visit.    [name] is moving well. No concerns today.  Denies the presence of: Bleeding, LOF, or contractions     History  OB History       Gravida   4    Para   3    Term   3    Preterm   0    AB   0    Living   3         SAB   0    IAB   0    Ectopic   0    Multiple   0    Live Births   3           Obstetric Comments   No history of any SAB, AB or Ectopic Pregnancies.        Patient stated that her BP goes up toward middle of pregnancy and end if it does.    2015 G1--No history of any PTL, PTD, PROM, Gest DM, Gest HTN, Preeclampsia, PP Depression, PP Hemorrhage or seen  by High Risk.  No induction.  Daughter did well after delivery.  Did have a little Baby Blues and no med.  2021  G2--No history of any PTL, PTD, PROM, Gest DM, Preeclampsia, PP Depression, PP Hemorrhage or seen by High Risk.  Patient was induced due to  Gest HTN (no med during pregnancy and not sure if had Magnesium during labor, no med after discharge).  Did have some Baby blues and no med.  [name] and readmitted for jaundice and had phototherapy and then did well.  Had Graves and watch ed for antibodies.  2023  G3--No history of any PTL, PTD, PROM, Gest DM, Gest HTN, Preeclampsia, PP Depression, PP Hemorrhage or seen by High Risk.  No induction.  Patient did have baby blues and no med.  [name] and did well after deliv ery.  G4--current pregnancy--Nausea and some vomiting when she brushes her teeth and will have nausea in late evening when she is working.  After that she is able to keep food and fluids down.  OTC Recommendations Reviewed with patient.   Had sex and nex t morning had some bleeding when voided and did not have to wear pad and none since.  Will have an occasional cramp that is very mild and goes away quickly at bottom of her abdomen.   Reviewed S/Sx's with patient to go to  the ER for prior to her appt wit h provider or if office is closed and to report to provider once they see her for her NOB appt.                 Past Medical History:   Diagnosis Date    COVID     Family history of breast cancer in first degree relative     patient's mom and she is unsure of BRCA gene    Gestational hypertension 11/01/2019    Graves disease     PCOS (polycystic ovarian syndrome)     Personal history of other medical treatment     patient did have some baby blues after each daughter and no med needed    Seasonal allergic reaction     occasional OTC meds    SVD (spontaneous vaginal delivery) 07/08/2021    [name]  presented to L&D on the morning of 07/07/2020 in early active labor with contractions that had increased in frequency and intensity over night.  She progressed without augmentation to SROM at 1525 and to SVD over a small vaginal laceration at 1602.  Baby girl, [name] weighed 3150 gm and had APGARS of 8-9.  Vaginal laceration was repaired under local anesthetic with 3.0 Vicryl.  EBL 250 mL.       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 and was feeling low she  is currently taking 5000 units    Doxylamine Succinate, Sleep, (UNISOM, DOXYLAMINE,) 25 mg Oral Tablet Take 1 Tablet (25 mg total) by mouth Daily    ferrous sulfate (FERATAB) 324 mg (65 mg iron ) Oral Tablet, Delayed Release (E.C.) Take 1 Tablet (324 mg total) by mouth Every other day Recommended by her doctor    magnesium citrate (CITROMA) Oral Solution Take 296 mL by mouth One time    PNV Combo #19-Iron -Fol Ac-DHA 22-6-1-200 mg Oral Capsule Take 1 Cap by mouth Once a day    pyridoxine, vitamin B6, (VITAMIN B6) 50 mg Oral Tablet Take 1 Tablet (50 mg total) by mouth Daily    terconazole  (TERAZOL 7 ) 0.4 % Vaginal Cream Insert 1 Applicator into the vagina Every night     UNITHROID  112 mcg Oral Tablet Take 1 tablet (112 mcg) 5 days a week and then Sat and Sun take 2 tablets (224 mcg).     Allergies[1]    Weight: 114 kg (250 lb 14.1 oz)  BP (Non-Invasive): 108/74  # of Fetuses: 1  Fundal Height: 36  Preterm Labor: [name]  Fetal Movement: Present  Edema: Negative  FHR (1): rNST  OB Exam Comments: oblique lie    General: A&O x4; in no acute distress   Respiratory: Respiratory rate regular; respirations unlabored   GI: Gravid; soft, non-tender; fundal height appropriate  Extremities: No significant edema, no erythema or excessive warmth  Psych: Affect appropriate; no evidence of anxiety or depression.     Assessment:     1 IUP at [redacted]w[redacted]d  2 S = D  3 Rh Negative  4 GBS Negative  5 Hx of GHTN  6 Hypothyroid   7 Prepregnancy BMI 39    Plan:     Discussed oblique fetal lie. Encouraged Spinning Babies exercises.   Reviewed option for ECV as fetal head is off to maternal left and almost cephalic. [name] is interested. Would like to try exercises over the weekend and have a formal scan to assess at the start of next week.  US  ordered.  Rhogam panel postpartum.  Encouraged ldASA - [name] desired to not treat.  Endocrine appt scheduled 12/09/2023.  Weekly testing given BMI. NST completed today - reactive. See procedure note.  Warning signs, precautions, reasons to call reviewed.   Disposition: Return in about 1 week (around 11/23/2023) for NST, ROB or sooner PRN.      Yarisbel Miranda Pomp-Steurer, APRN,MIDWIFE  11/16/2023, 16:12           [1] No Known Allergies

## 2023-11-17 ENCOUNTER — Encounter (HOSPITAL_BASED_OUTPATIENT_CLINIC_OR_DEPARTMENT_OTHER): Payer: Self-pay | Admitting: Advanced Practice Midwife

## 2023-11-19 ENCOUNTER — Ambulatory Visit (INDEPENDENT_AMBULATORY_CARE_PROVIDER_SITE_OTHER): Payer: Self-pay

## 2023-11-23 ENCOUNTER — Encounter (HOSPITAL_BASED_OUTPATIENT_CLINIC_OR_DEPARTMENT_OTHER): Payer: Self-pay | Admitting: Advanced Practice Midwife

## 2023-11-23 ENCOUNTER — Ambulatory Visit (HOSPITAL_BASED_OUTPATIENT_CLINIC_OR_DEPARTMENT_OTHER): Payer: Self-pay

## 2023-11-23 ENCOUNTER — Ambulatory Visit: Payer: Self-pay | Attending: Advanced Practice Midwife | Admitting: Advanced Practice Midwife

## 2023-11-23 ENCOUNTER — Other Ambulatory Visit: Payer: Self-pay

## 2023-11-23 VITALS — BP 118/74 | Ht 66.0 in | Wt 251.5 lb

## 2023-11-23 DIAGNOSIS — Z3689 Encounter for other specified antenatal screening: Secondary | ICD-10-CM | POA: Insufficient documentation

## 2023-11-23 DIAGNOSIS — Z3483 Encounter for supervision of other normal pregnancy, third trimester: Secondary | ICD-10-CM

## 2023-11-23 DIAGNOSIS — Z3A38 38 weeks gestation of pregnancy: Secondary | ICD-10-CM

## 2023-11-23 DIAGNOSIS — O99213 Obesity complicating pregnancy, third trimester: Secondary | ICD-10-CM

## 2023-11-23 DIAGNOSIS — Z6841 Body Mass Index (BMI) 40.0 and over, adult: Secondary | ICD-10-CM | POA: Insufficient documentation

## 2023-11-23 NOTE — Procedures (Signed)
 OB/GYN, [facility]  [address]  [place] [address]  Operated by [facility]  Procedure Note    Name: [name] MRN:  [id number]   Date: 11/23/2023 DOB:  1990-11-27 (32 y.o.)         59025 - FETAL NON STRESS TEST (AMB ONLY)    Performed by: Ralph Burke, APRN,MIDWIFE  Authorized by: Ralph Burke, APRN,MIDWIFE    Time Out:     Immediately before the procedure, a time out was called:  Yes    Patient verified:  Yes    Procedure Verified:  Yes    Site Verified:  Yes    Procedure Date: 11/23/23     Procedure: Fetal Non Stress Test  Indication:  IUP at [redacted]w[redacted]d     Estimated Date of Delivery: 12/04/23     Base Line: 125 bpm                          Decelerations:  None   Moderate Variability, Accelerations Present      Uterine Activity:  Contractions: No    Interpretation: Reactive    Katie Mcginness Pomp-Steurer, APRN,MIDWIFE  11/23/2023, 16:17

## 2023-11-23 NOTE — Progress Notes (Signed)
 Patient seen by Ralph Burke, CNM     [name] is a 33 y.o. [phone] here for routine antepartum visit.    [name] is moving well.   Denies the presence of: Bleeding, LOF, or regular contractions    Concerns:  1 Wants to make sure Katie is lined up better - feeling more pelvic pressure  2 Has been having irregular contractions     History  OB History       Gravida   4    Para   3    Term   3    Preterm   0    AB   0    Living   3         SAB   0    IAB   0    Ectopic   0    Multiple   0    Live Births   3           Obstetric Comments   No history of any SAB, AB or Ectopic Pregnancies.        Patient stated that her BP goes up toward middle of pregnancy and end if it does.    2015 G1--No history of any PTL, PTD, PROM, Gest DM, Gest HTN, Preeclampsia, PP Depression, PP Hemorrhage or seen  by High Risk.  No induction.  Daughter did well after delivery.  Did have a little Katie Blues and no med.  2021  G2--No history of any PTL, PTD, PROM, Gest DM, Preeclampsia, PP Depression, PP Hemorrhage or seen by High Risk.  Patient was induced due to  Gest HTN (no med during pregnancy and not sure if had Magnesium during labor, no med after discharge).  Did have some Katie blues and no med.  Daughter's name is [name] and readmitted for jaundice and had phototherapy and then did well.  Had Graves and watch ed for antibodies.  2023  G3--No history of any PTL, PTD, PROM, Gest DM, Gest HTN, Preeclampsia, PP Depression, PP Hemorrhage or seen by High Risk.  No induction.  Patient did have Katie blues and no med.  Daughter's name is [name] and did well after deliv ery.  G4--current pregnancy--Nausea and some vomiting when she brushes her teeth and will have nausea in late evening when she is working.  After that she is able to keep food and fluids down.  OTC Recommendations Reviewed with patient.   Had sex and nex t morning had some bleeding when voided and did not have to wear pad and none since.  Will have an  occasional cramp that is very mild and goes away quickly at bottom of her abdomen.   Reviewed S/Sx's with patient to go to the ER for prior to her appt wit h provider or if office is closed and to report to provider once they see her for her NOB appt.                 Past Medical History:   Diagnosis Date    COVID     Family history of breast cancer in first degree relative     patient's mom and she is unsure of BRCA gene    Gestational hypertension 11/01/2019    Graves disease     PCOS (polycystic ovarian syndrome)     Personal history of other medical treatment     patient did have some Katie blues after each daughter and no med  needed    Seasonal allergic reaction     occasional OTC meds    SVD (spontaneous vaginal delivery) 07/08/2021    [name] presented to L&D on the morning of 07/07/2020 in early active labor with contractions that had increased in frequency and intensity over night.  She progressed without augmentation to SROM at 1525 and to SVD over a small vaginal laceration at 1602.  Katie girl, [name] weighed 3150 gm and had APGARS of 8-9.  Vaginal laceration was repaired under local anesthetic with 3.0 Vicryl.  EBL 250 mL.       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 and was feeling low she  is currently taking 5000 units    Doxylamine Succinate, Sleep, (UNISOM, DOXYLAMINE,) 25 mg Oral Tablet Take 1 Tablet (25 mg total) by mouth Daily    ferrous sulfate (FERATAB) 324 mg (65 mg iron ) Oral Tablet, Delayed Release (E.C.) Take 1 Tablet (324 mg total) by mouth Every other day Recommended by her doctor    magnesium citrate (CITROMA) Oral Solution Take 296 mL by mouth One time    PNV Combo #19-Iron -Fol Ac-DHA 22-6-1-200 mg Oral Capsule Take 1 Cap by mouth Once a day    pyridoxine, vitamin B6, (VITAMIN B6) 50 mg Oral Tablet Take 1 Tablet (50 mg  total) by mouth Daily    terconazole  (TERAZOL 7 ) 0.4 % Vaginal Cream Insert 1 Applicator into the vagina Every night (Patient not taking: Reported on 11/23/2023)    UNITHROID  112 mcg Oral Tablet Take 1 tablet (112 mcg) 5 days a week and then Sat and Sun take 2 tablets (224 mcg).     Allergies[1]    Weight: 114 kg (251 lb 8.7 oz)  BP (Non-Invasive): 118/74  # of Fetuses: 1  Fundal Height: 38  Preterm Labor: Glover Larve  Fetal Movement: Present  Presentation: Cephalic  Edema: Negative  FHR (1): rNST    General: A&O x4; in no acute distress   Respiratory: Respiratory rate regular; respirations unlabored   GI: Gravid; soft, non-tender; fundal height appropriate  Extremities: No significant edema, no erythema or excessive warmth  Psych: Affect appropriate; no evidence of anxiety or depression.     Assessment:     1 IUP at [redacted]w[redacted]d  2 S = D  3 Rh Negative  4 GBS Negative  5 Hx of GHTN  6 Hypothyroid   7 Prepregnancy BMI 39    Plan:     Reviewed fetal lie is cephalic and more midline this week!  Rhogam panel postpartum.  Encouraged ldASA - Cherylee desired to not treat.  Endocrine appt scheduled 12/09/2023.  Weekly testing given BMI. NST completed today - reactive. See procedure note.  Warning signs, precautions, reasons to call reviewed.   Disposition: Return in about 1 week (around 11/30/2023) for NST, ROB or sooner PRN.      Katie Previti Pomp-Steurer, APRN,MIDWIFE            [1] No Known Allergies

## 2023-11-29 NOTE — Progress Notes (Signed)
 [facility]necology    RETURN OBSTETRICAL ENCOUNTER    [name] is a here for ROB visit [redacted]w[redacted]d.   Pregnancy is complicated by rh negative state, pre-pregnancy BMI 39, graves hypothyroid, and history of gHTN.     Baby is moving well  Denies the presence of: LOF, vaginal bleeding, cramping, N/V, HA, visual disturbances, and epigastric pain.    Endorses braxton hicks     Today she has concerns regarding: desires SVE and potential membrane sweep      Dating Summary    Working EDD: 12/04/2023 set by Katie Linker, RN on 05/11/2023 based on Last Menstrual Period on 02/27/2023   Based On EDD GA Diff User Date    Last Menstrual Period on 02/27/2023 12/04/2023 Working Katie Linker, RN 05/11/2023        Ultrasound on 05/19/2023 12/07/2023 -3d [name], Kayla, APRN,MIDWIFE 11/16/2023    GA:  [redacted]w[redacted]d             History  OB History       Gravida   4    Para   3    Term   3    Preterm   0    AB   0    Living   3         SAB   0    IAB   0    Ectopic   0    Multiple   0    Live Births   3           Obstetric Comments   No history of any SAB, AB or Ectopic Pregnancies.        Patient stated that her BP goes up toward middle of pregnancy and end if it does.    2015 G1--No history of any PTL, PTD, PROM, Gest DM, Gest HTN, Preeclampsia, PP Depression, PP Hemorrhage or seen  by High Risk.  No induction.  Daughter did well after delivery.  Did have a little Baby Blues and no med.  2021  G2--No history of any PTL, PTD, PROM, Gest DM, Preeclampsia, PP Depression, PP Hemorrhage or seen by High Risk.  Patient was induced due to  Gest HTN (no med during pregnancy and not sure if had Magnesium during labor, no med after discharge).  Did have some Baby blues and no med.  Daughter's name is [name] and readmitted for jaundice and had phototherapy and then did well.  Had Graves and watch ed for antibodies.  2023  G3--No history of any PTL, PTD, PROM, Gest DM, Gest HTN, Preeclampsia, PP  Depression, PP Hemorrhage or seen by High Risk.  No induction.  Patient did have baby blues and no med.  Daughter's name is [name] and did well after deliv ery.  G4--current pregnancy--Nausea and some vomiting when she brushes her teeth and will have nausea in late evening when she is working.  After that she is able to keep food and fluids down.  OTC Recommendations Reviewed with patient.   Had sex and nex t morning had some bleeding when voided and did not have to wear pad and none since.  Will have an occasional cramp that is very mild and goes away quickly at bottom of her abdomen.   Reviewed S/Sx's with patient to go to the ER for prior to her appt wit h provider or if office is closed and to report to provider once they see her for her NOB appt.  Past Medical History:   Diagnosis Date    COVID     Family history of breast cancer in first degree relative     patient's mom and she is unsure of BRCA gene    Gestational hypertension 11/01/2019    Graves disease     PCOS (polycystic ovarian syndrome)     Personal history of other medical treatment     patient did have some baby blues after each daughter and no med needed    Seasonal allergic reaction     occasional OTC meds    SVD (spontaneous vaginal delivery) 07/08/2021    [name] presented to L&D on the morning of 07/07/2020 in early active labor with contractions that had increased in frequency and intensity over night.  She progressed without augmentation to SROM at 1525 and to SVD over a small vaginal laceration at 1602.  Baby girl, [name] weighed 3150 gm and had APGARS of 8-9.  Vaginal laceration was repaired under local anesthetic with 3.0 Vicryl.  EBL 250 mL.       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 and was feeling low she  is currently taking 5000 units    Doxylamine  Succinate, Sleep, (UNISOM, DOXYLAMINE,) 25 mg Oral Tablet Take 1 Tablet (25 mg total) by mouth Daily    ferrous sulfate (FERATAB) 324 mg (65 mg iron ) Oral Tablet, Delayed Release (E.C.) Take 1 Tablet (324 mg total) by mouth Every other day Recommended by her doctor    magnesium citrate (CITROMA) Oral Solution Take 296 mL by mouth One time    PNV Combo #19-Iron -Fol Ac-DHA 22-6-1-200 mg Oral Capsule Take 1 Cap by mouth Once a day    pyridoxine, vitamin B6, (VITAMIN B6) 50 mg Oral Tablet Take 1 Tablet (50 mg total) by mouth Daily    terconazole  (TERAZOL 7 ) 0.4 % Vaginal Cream Insert 1 Applicator into the vagina Every night (Patient not taking: Reported on 11/23/2023)    UNITHROID  112 mcg Oral Tablet Take 1 tablet (112 mcg) 5 days a week and then Sat and Sun take 2 tablets (224 mcg).     No Known Allergies    Weight: 113 kg (248 lb 14.4 oz)  BP (Non-Invasive): 132/86  # of Fetuses: 1  Preterm Labor: [name]  Fetal Movement: Present  Presentation: Cephalic  FHR (1): 140s per NST    General: A&O x4; in no acute distress, appears well  Respiratory: respirations unlabored; respiratory rate regular  Cardiovascular: appears well perfused   Extremities: extremities normal, atraumatic, no cyanosis or edema  Psych: Oriented, affect appropriate, appropriate responses    Assessment:     IUP at [redacted]w[redacted]d  Rh negative  cfDNA deferred  Early 1hr GTT 94  Anatomy scan complete and normal  1hr GTT 104  S/P rhogam tdap  GBS negative      ICD-10-CM    1. [redacted] weeks gestation of pregnancy  Z3A.39       2. Supervision of normal pregnancy  Z34.90         Plan:     Warning signs, precautions, reasons to call reviewed   Anticipatory guidance given for future appointments  Weekly NST for BMI - completed today  ROB in 1 wks  Currently wishes for spontaneous labor but is considering elective induction; will message on MyChart  if she desires to be put on the list   SVE per patient request   Discussed medial induction at 41 weeks if still no signs  of labor and risks of going over 41 weeks     [name] Katie Melody, FNP-C

## 2023-11-30 ENCOUNTER — Ambulatory Visit (HOSPITAL_BASED_OUTPATIENT_CLINIC_OR_DEPARTMENT_OTHER): Payer: Self-pay

## 2023-12-01 ENCOUNTER — Encounter (HOSPITAL_BASED_OUTPATIENT_CLINIC_OR_DEPARTMENT_OTHER): Payer: Self-pay | Admitting: NURSE PRACTITIONER

## 2023-12-01 ENCOUNTER — Other Ambulatory Visit: Payer: Self-pay

## 2023-12-01 ENCOUNTER — Ambulatory Visit: Payer: Self-pay | Attending: NURSE PRACTITIONER | Admitting: NURSE PRACTITIONER

## 2023-12-01 ENCOUNTER — Ambulatory Visit (HOSPITAL_BASED_OUTPATIENT_CLINIC_OR_DEPARTMENT_OTHER)

## 2023-12-01 VITALS — BP 132/86 | Ht 66.0 in | Wt 248.9 lb

## 2023-12-01 DIAGNOSIS — O09293 Supervision of pregnancy with other poor reproductive or obstetric history, third trimester: Secondary | ICD-10-CM

## 2023-12-01 DIAGNOSIS — Z3A39 39 weeks gestation of pregnancy: Secondary | ICD-10-CM

## 2023-12-01 DIAGNOSIS — E669 Obesity, unspecified: Secondary | ICD-10-CM

## 2023-12-01 DIAGNOSIS — O99213 Obesity complicating pregnancy, third trimester: Secondary | ICD-10-CM

## 2023-12-01 DIAGNOSIS — E66811 Obesity, class 1: Secondary | ICD-10-CM | POA: Insufficient documentation

## 2023-12-01 DIAGNOSIS — Z3490 Encounter for supervision of normal pregnancy, unspecified, unspecified trimester: Secondary | ICD-10-CM | POA: Insufficient documentation

## 2023-12-01 DIAGNOSIS — Z3A37 37 weeks gestation of pregnancy: Secondary | ICD-10-CM | POA: Insufficient documentation

## 2023-12-01 NOTE — Patient Instructions (Signed)
 When you are in this final stretch, I like to send along some written instructions for you to refer to when you're feeling anxious about when to come in to the hospital and what to come in for.     The short version is this:     Come through the MAIN ENTR[facility] of [facility] for:  -Contractions that truly hurt and are becoming longer, stronger, and closer together; or  -Loss of fluid from your vagina that you can't stop; or  -Bleeding like a period; or  -Decreased fetal movement.     You may have LOTS of practice contractions before you begin true labor.  People refer to these as Braxton Hicks contractions.  They *ARE* real contractions but they are inconsistent--coming and going; starting and stopping; teasing you.     You will know it is true labor when the contractions are persistent and are getting longer and stronger and closer together.  We time them from the beginning of one contraction to the beginning of the next contraction.  When they are every 4 - 5 minutes and TRULY HURTING (taking your breath away)--when you can't walk through them, talk through them, eat through them, sleep through them--and have not been able to for a good hour, then it's time to come to the hospital.  (If you lived in town, I would say 2 hours.)  You should have a sense of certainty that THIS is what everyone has been talking about.  You shouldn't be wondering if it's really time to go.  You should feel like you really must come in.     When you are beginning to have more persistent contractions, go about your business; do whatever it was you would normally have been doing.  If it's time to eat, eat.  If it's time to sleep, try to sleep.  If you were going to work, go.  True active labor contractions will keep coming no matter what you do and will make it hard for you to go about your business.  False labor contractions often ease up when you become distracted by your routine activities.     It's nice to call the [facility] before coming in so the charge nurse can make plans for you.  The direct number is:  [phone].     If you have a big gush of fluid that you are certain is your water breaking or if you have a persistent trickle of fluid you can't stop--EVEN IF YOU AREN'T CONTRACTING AT ALL--please come in to the hospital.  We always admit you if your water if broken.     I hope this helps a bit.  Please let me know if you have questions

## 2023-12-01 NOTE — Procedures (Signed)
 OB/GYN, [facility]  [address]  [place] [address]  Operated by [facility]  Procedure Note    Name: [name] MRN:  [id number]   Date: 12/01/2023 DOB:  13-Jan-1991 (33 y.o.)         59025 - FETAL NON STRESS TEST (AMB ONLY)    Performed by: Carola Mardy HERO, FNP-C  Authorized by: Roswell Fleeting, APRN,MIDWIFE      Procedure: NST @ [redacted]w[redacted]d for BMI    Baseline: 140s  Variability: moderate  Accelerations: 15x15  Decelerations: none  Contractions: none    Interpretation: reactive    Mardy HERO Carola, FNP-C

## 2023-12-06 ENCOUNTER — Other Ambulatory Visit: Payer: Self-pay

## 2023-12-06 ENCOUNTER — Encounter (HOSPITAL_BASED_OUTPATIENT_CLINIC_OR_DEPARTMENT_OTHER): Payer: Self-pay

## 2023-12-06 ENCOUNTER — Ambulatory Visit (HOSPITAL_BASED_OUTPATIENT_CLINIC_OR_DEPARTMENT_OTHER): Payer: Self-pay

## 2023-12-06 ENCOUNTER — Ambulatory Visit: Payer: Self-pay | Attending: Family | Admitting: Family

## 2023-12-06 ENCOUNTER — Encounter (HOSPITAL_BASED_OUTPATIENT_CLINIC_OR_DEPARTMENT_OTHER): Payer: Self-pay | Admitting: Family

## 2023-12-06 VITALS — BP 134/86 | Ht 66.0 in | Wt 250.0 lb

## 2023-12-06 DIAGNOSIS — Z3A40 40 weeks gestation of pregnancy: Secondary | ICD-10-CM | POA: Insufficient documentation

## 2023-12-06 DIAGNOSIS — E66811 Obesity, class 1: Secondary | ICD-10-CM | POA: Insufficient documentation

## 2023-12-06 DIAGNOSIS — E669 Obesity, unspecified: Secondary | ICD-10-CM

## 2023-12-06 DIAGNOSIS — O99013 Anemia complicating pregnancy, third trimester: Secondary | ICD-10-CM

## 2023-12-06 DIAGNOSIS — O99213 Obesity complicating pregnancy, third trimester: Secondary | ICD-10-CM

## 2023-12-06 DIAGNOSIS — Z8639 Personal history of other endocrine, nutritional and metabolic disease: Secondary | ICD-10-CM | POA: Insufficient documentation

## 2023-12-06 NOTE — Progress Notes (Signed)
 Patient seen by [name], APRN,NP-C    [name] is a here for ROB visit [redacted]w[redacted]d.  She is here with her daughter today.   Baby is moving well.  Denies the presence of: LOF, vaginal bleeding, cramping/contractions, N/V, HA, visual disturbances, and epigastric pain.    Desires cervical exam today.   Is starting to experience an increase in anxiety. Aquarius reports that at the beginning of the pregnancy she was experiencing quite a bit of anxiety but then it seemed to improved until recently. She still desires to labor on her own due to her first labor that was an induction taking such a long time. But for childcare she is dependant on her in laws traveling from 8 hours away. She is considering scheduled induction at 41 weeks.     Dating Summary    Working EDD: 12/04/2023 set by [name], RN on 05/11/2023 based on Last Menstrual Period on 02/27/2023   Based On EDD GA Diff User Date    Last Menstrual Period on 02/27/2023 12/04/2023 Working [name], RN 05/11/2023        Ultrasound on 05/19/2023 12/07/2023 -3d Pomp-Steurer, Kayla, APRN,MIDWIFE 11/16/2023    GA:  [redacted]w[redacted]d               History  OB History       Gravida   4    Para   3    Term   3    Preterm   0    AB   0    Living   3         SAB   0    IAB   0    Ectopic   0    Multiple   0    Live Births   3           Obstetric Comments   No history of any SAB, AB or Ectopic Pregnancies.        Patient stated that her BP goes up toward middle of pregnancy and end if it does.    2015 G1--No history of any PTL, PTD, PROM, Gest DM, Gest HTN, Preeclampsia, PP Depression, PP Hemorrhage or seen  by High Risk.  No induction.  Daughter did well after delivery.  Did have a little Baby Blues and no med.  2021  G2--No history of any PTL, PTD, PROM, Gest DM, Preeclampsia, PP Depression, PP Hemorrhage or seen by High Risk.  Patient was induced due to  Gest HTN (no med during pregnancy and not sure if had Magnesium during labor, no med after  discharge).  Did have some Baby blues and no med.  Daughter's name is [name] and readmitted for jaundice and had phototherapy and then did well.  Had Graves and watch ed for antibodies.  2023  G3--No history of any PTL, PTD, PROM, Gest DM, Gest HTN, Preeclampsia, PP Depression, PP Hemorrhage or seen by High Risk.  No induction.  Patient did have baby blues and no med.  Daughter's name is [name] and did well after deliv ery.  G4--current pregnancy--Nausea and some vomiting when she brushes her teeth and will have nausea in late evening when she is working.  After that she is able to keep food and fluids down.  OTC Recommendations Reviewed with patient.   Had sex and nex t morning had some bleeding when voided and did not have to wear pad and none since.  Will have an occasional cramp  that is very mild and goes away quickly at bottom of her abdomen.   Reviewed S/Sx's with patient to go to the ER for prior to her appt wit h provider or if office is closed and to report to provider once they see her for her NOB appt.                 Past Medical History:   Diagnosis Date    COVID     Family history of breast cancer in first degree relative     patient's mom and she is unsure of BRCA gene    Gestational hypertension 11/01/2019    Graves disease     PCOS (polycystic ovarian syndrome)     Personal history of other medical treatment     patient did have some baby blues after each daughter and no med needed    Seasonal allergic reaction     occasional OTC meds    SVD (spontaneous vaginal delivery) 07/08/2021    [name] presented to L&D on the morning of 07/07/2020 in early active labor with contractions that had increased in frequency and intensity over night.  She progressed without augmentation to SROM at 1525 and to SVD over a small vaginal laceration at 1602.  Baby girl, [name] weighed 3150 gm and had APGARS of 8-9.  Vaginal laceration was repaired under local anesthetic with 3.0 Vicryl.  EBL 250 mL.         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 and was feeling low she  is currently taking 5000 units    Doxylamine Succinate, Sleep, (UNISOM, DOXYLAMINE,) 25 mg Oral Tablet Take 1 Tablet (25 mg total) by mouth Daily    ferrous sulfate (FERATAB) 324 mg (65 mg iron ) Oral Tablet, Delayed Release (E.C.) Take 1 Tablet (324 mg total) by mouth Every other day Recommended by her doctor    magnesium citrate (CITROMA) Oral Solution Take 296 mL by mouth One time    PNV Combo #19-Iron -Fol Ac-DHA 22-6-1-200 mg Oral Capsule Take 1 Cap by mouth Once a day    pyridoxine, vitamin B6, (VITAMIN B6) 50 mg Oral Tablet Take 1 Tablet (50 mg total) by mouth Daily    terconazole  (TERAZOL 7 ) 0.4 % Vaginal Cream Insert 1 Applicator into the vagina Every night (Patient not taking: Reported on 11/23/2023)    UNITHROID  112 mcg Oral Tablet Take 1 tablet (112 mcg) 5 days a week and then Sat and Sun take 2 tablets (224 mcg).     No Known Allergies    Weight: 113 kg (250 lb)  BP (Non-Invasive): 134/86  # of Fetuses: 1  Preterm Labor: Darol Irving  Fetal Movement: Present  Presentation: Cephalic  Edema: Negative  Dilation : 2  Effacement (%): 70  Station: -3  FHR (1): 140s  OB Exam Comments: RNST    General: A&O x4; in no acute distress, appears well  Respiratory: respirations unlabored; respiratory rate regular  Cardiovascular: appears well perfused   Extremities: extremities normal, atraumatic, no cyanosis or edema.   Psych: Oriented, affect appropriate, appropriate responses.      Assessment:     IUP at [redacted]w[redacted]d  Rh negative: Rhogam 09/22/2023  Prepregnancy BMI 39  Hx of GHTN with prior pregnancy  Hx of Graves Disease  Tdap: UTD  GBS negative  ICD-10-CM    1. [redacted] weeks gestation of pregnancy  Z3A.40 59025 - FETAL NON STRESS TEST (AMB ONLY)      2. Obesity  PCOS  Z33.188 40974 - FETAL NON STRESS TEST (AMB  ONLY)      3. History of Graves' disease  Hypothyroidism  Z86.39 59025 - FETAL NON STRESS TEST (AMB ONLY)          Plan:     RNST obtained today. Refer to procedure note for details.   Induction scheduled on July 5th at 8pm at [redacted] weeks gestation.   Warning signs, precautions, reasons to call reviewed.     [name], APRN,NP-C

## 2023-12-06 NOTE — Procedures (Signed)
 OB/GYN, [facility]  [address]  [place] [address]  Operated by [facility]  Procedure Note    Name: [name] MRN:  [id number]   Date: 12/06/2023 DOB:  [birth year] (32 y.o.)         59025 - FETAL NON STRESS TEST (AMB ONLY)    Performed by: Elaine Reena SAUNDERS, APRN,NP-C  Authorized by: Roswell Fleeting, APRN,MIDWIFE      Procedure: NST    Baseline: 140  Variability: moderate  Accelerations: 15x15  Decelerations: None  Contractions: one    Interpretation: Reactive    Reena SAUNDERS Elaine, APRN,NP-C       Reena SAUNDERS Elaine, APRN,NP-C

## 2023-12-07 ENCOUNTER — Encounter (HOSPITAL_BASED_OUTPATIENT_CLINIC_OR_DEPARTMENT_OTHER): Payer: Self-pay | Admitting: Family

## 2023-12-07 ENCOUNTER — Other Ambulatory Visit (HOSPITAL_BASED_OUTPATIENT_CLINIC_OR_DEPARTMENT_OTHER): Payer: Self-pay | Admitting: Family

## 2023-12-07 DIAGNOSIS — Z3A40 40 weeks gestation of pregnancy: Secondary | ICD-10-CM

## 2023-12-08 ENCOUNTER — Other Ambulatory Visit: Payer: Self-pay

## 2023-12-08 ENCOUNTER — Inpatient Hospital Stay (HOSPITAL_COMMUNITY)

## 2023-12-08 ENCOUNTER — Inpatient Hospital Stay (HOSPITAL_COMMUNITY): Admitting: Obstetrics & Gynecology

## 2023-12-08 ENCOUNTER — Inpatient Hospital Stay
Admission: AD | Admit: 2023-12-08 | Discharge: 2023-12-10 | DRG: 806 | Disposition: A | Discharge status: Home / Self Care | Attending: Student in an Organized Health Care Education/Training Program | Admitting: Student in an Organized Health Care Education/Training Program

## 2023-12-08 ENCOUNTER — Encounter (HOSPITAL_COMMUNITY): Payer: Self-pay | Admitting: Obstetrics & Gynecology

## 2023-12-08 DIAGNOSIS — O99214 Obesity complicating childbirth: Secondary | ICD-10-CM | POA: Diagnosis present

## 2023-12-08 DIAGNOSIS — E282 Polycystic ovarian syndrome: Secondary | ICD-10-CM | POA: Diagnosis present

## 2023-12-08 DIAGNOSIS — Z3A40 40 weeks gestation of pregnancy: Secondary | ICD-10-CM

## 2023-12-08 DIAGNOSIS — O480 Post-term pregnancy: Principal | ICD-10-CM | POA: Diagnosis present

## 2023-12-08 DIAGNOSIS — Z3490 Encounter for supervision of normal pregnancy, unspecified, unspecified trimester: Principal | ICD-10-CM | POA: Diagnosis present

## 2023-12-08 DIAGNOSIS — E039 Hypothyroidism, unspecified: Secondary | ICD-10-CM | POA: Diagnosis present

## 2023-12-08 DIAGNOSIS — O26893 Other specified pregnancy related conditions, third trimester: Secondary | ICD-10-CM | POA: Diagnosis present

## 2023-12-08 DIAGNOSIS — Z8639 Personal history of other endocrine, nutritional and metabolic disease: Principal | ICD-10-CM

## 2023-12-08 DIAGNOSIS — O99284 Endocrine, nutritional and metabolic diseases complicating childbirth: Secondary | ICD-10-CM | POA: Diagnosis present

## 2023-12-08 DIAGNOSIS — O134 Gestational [pregnancy-induced] hypertension without significant proteinuria, complicating childbirth: Secondary | ICD-10-CM | POA: Diagnosis present

## 2023-12-08 DIAGNOSIS — Z6711 Type A blood, Rh negative: Secondary | ICD-10-CM

## 2023-12-08 LAB — DRUG SCREEN, NO CONFIRMATION, URINE
AMPHETAMINES, URINE: NEGATIVE
BARBITURATES URINE: NEGATIVE
BENZODIAZEPINES URINE: NEGATIVE
BUPRENORPHINE URINE: NEGATIVE
CANNABINOIDS URINE: NEGATIVE
COCAINE METABOLITES URINE: NEGATIVE
CREATININE RANDOM URINE: 16 mg/dL — ABNORMAL LOW (ref >=20)
ECSTASY/MDMA URINE: NEGATIVE
FENTANYL, RANDOM URINE: NEGATIVE
METHADONE URINE: NEGATIVE
OPIATES URINE (LOW CUTOFF): NEGATIVE
OXYCODONE URINE: NEGATIVE

## 2023-12-08 LAB — CBC
HCT: 37 % (ref 34.8–46.0)
HGB: 13.1 g/dL (ref 11.5–16.0)
MCH: 32.1 pg — ABNORMAL HIGH (ref 26.0–32.0)
MCHC: 35.4 g/dL (ref 31.0–35.5)
MCV: 90.7 fL (ref 78.0–100.0)
MPV: 10.8 fL (ref 8.7–12.5)
PLATELETS: 172 10*3/uL (ref 150–400)
RBC: 4.08 10*6/uL (ref 3.85–5.22)
RDW-CV: 13.3 % (ref 11.5–15.5)
WBC: 8.8 10*3/uL (ref 3.7–11.0)

## 2023-12-08 LAB — PROTEIN/CREATININE RATIO, URINE, RANDOM
CREATININE RANDOM URINE: 17 mg/dL
PROTEIN RANDOM URINE: <7 mg/dL

## 2023-12-08 LAB — AST (SGOT): AST (SGOT): 19 U/L (ref 11–34)

## 2023-12-08 LAB — CREATININE WITH EGFR
CREATININE: 0.61 mg/dL (ref 0.60–1.05)
ESTIMATED GFR - FEMALE: >90 mL/min/BSA (ref >=60)

## 2023-12-08 LAB — ALT (SGPT): ALT (SGPT): 13 U/L (ref <31)

## 2023-12-08 LAB — TYPE AND SCREEN
ABO/RH(D): A NEG
ANTIBODY SCREEN: NEGATIVE

## 2023-12-08 LAB — SYPHILIS SCREENING ALGORITHM WITH REFLEX, SERUM: SYPHILIS TP ANTIBODIES: NONREACTIVE

## 2023-12-08 MED ORDER — IBUPROFEN 600 MG TABLET
600.0000 mg | ORAL_TABLET | Freq: Four times a day (QID) | ORAL | Status: DC
Start: 2023-12-08 — End: 2023-12-10
  Administered 2023-12-08 – 2023-12-10 (×7): 600 mg via ORAL
  Filled 2023-12-08 (×8): qty 1

## 2023-12-08 MED ORDER — SODIUM CHLORIDE 0.9% FLUSH BAG - 250 ML
INTRAVENOUS | Status: DC | PRN
Start: 2023-12-08 — End: 2023-12-10

## 2023-12-08 MED ORDER — OXYTOCIN 30 UNIT/500 ML IN 0.9 % SODIUM CHLORIDE INTRAVENOUS
1.0000 m[IU]/min | INTRAVENOUS | Status: DC
Start: 2023-12-08 — End: 2023-12-08
  Administered 2023-12-08: 2 m[IU]/min via INTRAVENOUS
  Administered 2023-12-08: 1 m[IU]/min via INTRAVENOUS
  Administered 2023-12-08: 4 m[IU]/min via INTRAVENOUS
  Administered 2023-12-08: 0 m[IU]/min via INTRAVENOUS
  Administered 2023-12-08: 4 m[IU]/min via INTRAVENOUS
  Filled 2023-12-08: qty 500

## 2023-12-08 MED ORDER — LIDOCAINE (PF) 20 MG/ML (2 %) INJECTION SOLUTION
10.0000 mL | Freq: Once | INTRAMUSCULAR | Status: DC | PRN
Start: 2023-12-08 — End: 2023-12-08

## 2023-12-08 MED ORDER — LACTATED RINGERS INTRAVENOUS SOLUTION
INTRAVENOUS | Status: DC
Start: 2023-12-08 — End: 2023-12-08
  Administered 2023-12-08: 0 mL via INTRAVENOUS

## 2023-12-08 MED ORDER — OXYTOCIN 30 UNIT/500 ML IN 0.9 % SODIUM CHLORIDE INTRAVENOUS
334.0000 m[IU]/min | INTRAVENOUS | Status: AC
Start: 2023-12-08 — End: 2023-12-09
  Administered 2023-12-08: 95 m[IU]/min via INTRAVENOUS
  Administered 2023-12-08: 334 m[IU]/min via INTRAVENOUS
  Administered 2023-12-08: 95 m[IU]/min via INTRAVENOUS
  Administered 2023-12-09: 0 m[IU]/min via INTRAVENOUS

## 2023-12-08 MED ORDER — ACETAMINOPHEN 325 MG TABLET
650.0000 mg | ORAL_TABLET | ORAL | Status: DC | PRN
Start: 2023-12-08 — End: 2023-12-10
  Administered 2023-12-08: 650 mg via ORAL
  Filled 2023-12-08: qty 2

## 2023-12-08 MED ORDER — DEXTROSE 5% IN WATER (D5W) FLUSH BAG - 250 ML
INTRAVENOUS | Status: DC | PRN
Start: 2023-12-08 — End: 2023-12-10

## 2023-12-08 MED ORDER — GLYCERIN-WITCH HAZEL 12.5 %-50 % TOPICAL PADS
MEDICATED_PAD | CUTANEOUS | Status: DC | PRN
Start: 2023-12-08 — End: 2023-12-10

## 2023-12-08 MED ORDER — LIDOCAINE-EPINEPHRINE (PF) 1.5 %-1:200,000 INJECTION SOLUTION
Freq: Once | INTRAMUSCULAR | Status: DC | PRN
Start: 2023-12-08 — End: 2023-12-10
  Administered 2023-12-08: 3 mL via EPIDURAL
  Administered 2023-12-08: 2 mL via EPIDURAL

## 2023-12-08 MED ORDER — CALCIUM 200 MG (AS CALCIUM CARBONATE 500 MG) CHEWABLE TABLET
500.0000 mg | CHEWABLE_TABLET | Freq: Three times a day (TID) | ORAL | Status: DC | PRN
Start: 2023-12-08 — End: 2023-12-10

## 2023-12-08 MED ORDER — MISOPROSTOL 25 MCG QUARTER TABLET
25.0000 ug | ORAL_TABLET | ORAL | Status: DC | PRN
Start: 2023-12-08 — End: 2023-12-08
  Filled 2023-12-08: qty 1

## 2023-12-08 MED ORDER — SODIUM CHLORIDE 0.9 % (FLUSH) INJECTION SYRINGE
2.0000 mL | INJECTION | INTRAMUSCULAR | Status: DC | PRN
Start: 2023-12-08 — End: 2023-12-10

## 2023-12-08 MED ORDER — DOCUSATE SODIUM 100 MG CAPSULE
100.0000 mg | ORAL_CAPSULE | Freq: Two times a day (BID) | ORAL | Status: DC | PRN
Start: 2023-12-08 — End: 2023-12-10

## 2023-12-08 MED ORDER — FENTANYL 2MCG/ML + ROPIVACAINE 0.2% IN NS 250ML (TOT VOL) PCEA INFUSION
INJECTION | Status: DC
Start: 2023-12-08 — End: 2023-12-08
  Administered 2023-12-08: 0 mL/h via EPIDURAL
  Filled 2023-12-08: qty 250

## 2023-12-08 MED ORDER — LEVOTHYROXINE 112 MCG TABLET
224.0000 ug | ORAL_TABLET | ORAL | Status: DC
Start: 2023-12-11 — End: 2023-12-10

## 2023-12-08 MED ORDER — MINERAL OIL ORAL
30.0000 mL | TOPICAL_OIL | ORAL | Status: DC | PRN
Start: 2023-12-08 — End: 2023-12-08

## 2023-12-08 MED ORDER — LEVOTHYROXINE 112 MCG TABLET
112.0000 ug | ORAL_TABLET | Freq: Every morning | ORAL | Status: DC
Start: 2023-12-08 — End: 2023-12-08

## 2023-12-08 MED ORDER — LEVOTHYROXINE 112 MCG TABLET
112.0000 ug | ORAL_TABLET | ORAL | Status: DC
Start: 2023-12-09 — End: 2023-12-10
  Administered 2023-12-09 – 2023-12-10 (×2): 112 ug via ORAL
  Filled 2023-12-08 (×2): qty 1

## 2023-12-08 MED ORDER — ONDANSETRON HCL (PF) 4 MG/2 ML INJECTION SOLUTION
4.0000 mg | Freq: Three times a day (TID) | INTRAMUSCULAR | Status: DC | PRN
Start: 2023-12-08 — End: 2023-12-08

## 2023-12-08 MED ORDER — RHO(D) IMMUNE GLOBULIN 1,500 UNIT (300 MCG) INTRAMUSCULAR SYRINGE
300.0000 ug | INJECTION | Freq: Once | INTRAMUSCULAR | Status: AC
Start: 2023-12-08 — End: 2023-12-09
  Administered 2023-12-09: 300 ug via INTRAMUSCULAR
  Filled 2023-12-08: qty 300

## 2023-12-08 MED ORDER — CHLOROPROCAINE (PF) 30 MG/ML (3 %) INJECTION SOLUTION
Freq: Once | INTRAMUSCULAR | Status: DC | PRN
Start: 2023-12-08 — End: 2023-12-10
  Administered 2023-12-08 (×2): 5 mL via EPIDURAL

## 2023-12-08 MED ORDER — SODIUM CHLORIDE 0.9 % (FLUSH) INJECTION SYRINGE
2.0000 mL | INJECTION | Freq: Three times a day (TID) | INTRAMUSCULAR | Status: DC
Start: 2023-12-08 — End: 2023-12-10
  Administered 2023-12-08: 5 mL
  Administered 2023-12-08: 0 mL
  Administered 2023-12-09: 2 mL
  Administered 2023-12-09: 0 mL
  Administered 2023-12-09: 4 mL
  Administered 2023-12-10: 0 mL

## 2023-12-08 NOTE — Progress Notes (Signed)
 Department of Obstetrics & Gynecology  Induction Bundle Requirements    Name: Indigo Barbian  MRN: [id number]  Date: 12/08/23     Gestational Age:  34 WEEKS OR ABOVE      Medical Reason for Induction:  Elevated BP    Estimated Fetal Weight (grams):  3150  LEOPOLD'S    Fetal and Uterine Monitoring:   FHR baseline 140bpm, moderate variability, accelerations present, no FHR decelerations  Uterine Activity:  2-3 contractions in 10 minutes.    Pelvic Assessment:  Bishop Score: (Consider cervical ripening if less than 5.)     0 1 2 3    Dilatation 0 1 - 2 3 - 4 5 or more   Effacement 0 - 30 40 - 50 60 - 70 80 or more   Station -3 -2 -1,0 +1,+2   Position Posterior Mid Anterior    Consistency Firm Medium Soft      Fetal Presentation:  CEPHALIC by Ultrasound    (Clinical Pelvimetry):  GYNECOID    Pt was able to eat and shower. Desires to start IOL now.   Recommended starting with pitocin  protocol- pt agreeable.     Truman Skillern, CNM

## 2023-12-08 NOTE — Anesthesia Preprocedure Evaluation (Signed)
 ANESTHESIA PRE-OP EVALUATION  Planned Procedure: ANES - LABOR ANALGESIA  Review of Systems     anesthesia history negative     patient summary reviewed  nursing notes reviewed        Pulmonary  negative pulmonary ROS,    Cardiovascular  negative cardio ROS,   ECG reviewed and history of pregnancy induced hypertension ,No peripheral edema,  Exercise Tolerance: > or = 4 METS        GI/Hepatic/Renal   negative GI/hepatic/renal ROS,         Endo/Other    obesity,      Neuro/Psych/MS   negative neuro/psych ROS,      Cancer    negative hematology/oncology ROS,                     Physical Assessment      Airway       Mallampati: II    TM distance: <3 FB    Neck ROM: full  Mouth Opening: good.            Dental       Dentition intact             Pulmonary    Breath sounds clear to auscultation  (-) no rhonchi, no decreased breath sounds, no wheezes, no rales and no stridor     Cardiovascular    Rhythm: regular  Rate: Normal  (-) no friction rub, carotid bruit is not present, no peripheral edema and no murmur     Other findings              Plan  ASA 3     Planned anesthesia type: epidural                             Anesthesia issues/risks discussed are: Failure of Block, Spinal Headache, Local Anesthetic Systemic Toxicity, High Neuraxial Block, Nerve Injuries and Blood Loss.  Anesthetic plan and risks discussed with patient  signed consent obtained            NPO Status: Full stomach precautions.         Plan discussed with attending.    (The risks of neuraxial analgesia including but not limited to procedure failure with or without repeat attempts, unilateral block, back pain, headache (PDPH), infection, epidural hematoma, and risk for paresthesia were discussed with the patient. All questions/concerns regarding neuraxial anesthesia were addressed prior to proceeding with the procedure. Consent was obtained.     Kevork Joyce, DO    )

## 2023-12-08 NOTE — H&P (Signed)
 [facility]necology  History and Physical    Name: [name]  MRN: [id number]  Date: 12/08/23    CC: Feeling unwell, elevated BP at home    HPI:    [name] is a 33 y.o. G4P3003 at 40w4 weeks based on LMP and confirmed by 101w1d US  reports for feeling unwell over the past two weeks. Has had increased nausea, decreased appetite, more frequent headaches and anxiety. States that she has also had mildly elevated BP's at home. Reports good fetal movement. Denies any vaginal bleeding.     Her prenatal course was significant for:  Patient Active Problem List    Diagnosis Date Noted    Encounter for induction of labor 12/08/2023    History of gestational hypertension 05/11/2023     With second pregnancy.   Encouraged lsASA  Baseline labs wnl      Pregnancy 05/29/2019     Screening:  Pap: 2022     Initial prenatal labs:  Date: 05/17/2023  Rh status: NEG  H/H: 13.8/40.3  Rubella: Imm  HIV: neg  Hep B: neg  Hep C: neg  RPR: NR  G/C:  neg  HgA1C:      Genetic screening:  Declines      Ultrasounds  Anatomy: 07/06/23: Fetal weight is at the  16th percentile ,   appropriate for gestational age with normal amniotic   fluid volume. Anatomy is within normal to the extent of   visualization, with exception of limitations below.   Anterior  placenta      Limitations include: suboptimal posterior fossa, palate,   suboptimal nose/lips, suboptimal 4 chamber, crossing,   RVOT, 3VTV, aortic arch, bicaval, left fingers, spine,   genitalia     Follow up if indicated: 2/26: estimated fetal weight equals 503 grams which is   at the 36th percentile, and the abdominal   circumference is at the 29th percentile, complete and normal      Tdap:    28 week labs:                H/H: 12.3/35                1 hr GTT: 104                RPR:                 3 hr GTT if indicated    RH status: 09/22/2023      GBS:   to be collected around 36 weeks     Feeding: breast  Contraception: withdrawal    Pain control: NCB        History of Graves' disease  Hypothyroidism 05/29/2019     S/p ablation in 2018  Dr Fawn manages medication adjustments        Obesity  PCOS 05/29/2019     Prepregnancy BMI 39  Detailed anatomy  Growth ultrasound once @ 28-32 weeks  Weekly antenatal testing @ 37 weeks        Rh negative state in antepartum period 10/28/2013     OB History       Gravida   4    Para   3    Term   3    Preterm   0    AB   0    Living   3         SAB   0    IAB  0    Ectopic   0    Multiple   0    Live Births   3           Obstetric Comments   No history of any SAB, AB or Ectopic Pregnancies.        Patient stated that her BP goes up toward middle of pregnancy and end if it does.    2015 G1--No history of any PTL, PTD, PROM, Gest DM, Gest HTN, Preeclampsia, PP Depression, PP Hemorrhage or seen  by High Risk.  No induction.  Daughter did well after delivery.  Did have a little Baby Blues and no med.  2021  G2--No history of any PTL, PTD, PROM, Gest DM, Preeclampsia, PP Depression, PP Hemorrhage or seen by High Risk.  Patient was induced due to  Gest HTN (no med during pregnancy and not sure if had Magnesium during labor, no med after discharge).  Did have some Baby blues and no med.  [name] and readmitted for jaundice and had phototherapy and then did well.  Had Graves and watch ed for antibodies.  2023  G3--No history of any PTL, PTD, PROM, Gest DM, Gest HTN, Preeclampsia, PP Depression, PP Hemorrhage or seen by High Risk.  No induction.  Patient did have baby blues and no med.  [name] and did well after deliv ery.  G4--current pregnancy--Nausea and some vomiting when she brushes her teeth and will have nausea in late evening when she is working.  After that she is able to keep food and fluids down.  OTC Recommendations Reviewed with patient.   Had sex and nex t morning had some bleeding when voided and did not have to wear pad and none since.  Will have an occasional  cramp that is very mild and goes away quickly at bottom of her abdomen.   Reviewed S/Sx's with patient to go to the ER for prior to her appt wit h provider or if office is closed and to report to provider once they see her for her NOB appt.                 Dating Summary    Working EDD: 12/04/2023 set by Katie Linker, RN on 05/11/2023 based on Last Menstrual Period on 02/27/2023   Based On EDD GA Diff User Date    Last Menstrual Period on 02/27/2023 12/04/2023 Working Katie Linker, RN 05/11/2023        Ultrasound on 05/19/2023 12/07/2023 -3d Pomp-Steurer, Kayla, APRN,MIDWIFE 11/16/2023    GA:  [redacted]w[redacted]d              Rh negative / Antibody Negative  Rubella: Immune  GBS: Negative     History  Past Medical History:   Diagnosis Date    COVID     Family history of breast cancer in first degree relative     patient's mom and she is unsure of BRCA gene    Gestational hypertension 11/01/2019    Graves disease     PCOS (polycystic ovarian syndrome)     Personal history of other medical treatment     patient did have some baby blues after each daughter and no med needed    Seasonal allergic reaction     occasional OTC meds    SVD (spontaneous vaginal delivery) 07/08/2021    Sanika presented to L&D on the morning of 07/07/2020 in early active labor with contractions that had increased in  frequency and intensity over night.  She progressed without augmentation to SROM at 1525 and to SVD over a small vaginal laceration at 1602.  Baby girl, [name] weighed 3150 gm and had APGARS of 8-9.  Vaginal laceration was repaired under local anesthetic with 3.0 Vicryl.  EBL 250 mL.           Past Surgical History:   Procedure Laterality Date    HX WISDOM TEETH EXTRACTION  2010         Current Facility-Administered Medications   Medication Dose Route Frequency Provider Last Rate Last Admin    acetaminophen  (TYLENOL ) tablet  650 mg Oral Q4H PRN Braun, Gretchen, CNM        calcium carbonate (TUMS) 500mg  (200mg  elemental calcium) chewable  tablet  500 mg Oral 3x/day PRN Braun, Gretchen, CNM        D5W 250 mL flush bag   Intravenous Q15 Min PRN Braun, Gretchen, CNM        levothyroxine  (SYNTHROID ) tablet  112 mcg Oral QAM Braun, Gretchen, CNM        lidocaine  PF (XYLOCAINE -MPF) 2% injection  10 mL Intradermal Once PRN Braun, Gretchen, CNM        LR premix infusion   Intravenous Continuous Braun, Gretchen, CNM        mineral oil topical liquid  30 mL Topical Q10 Min PRN Braun, Gretchen, CNM        miSOPROStol  (CYTOTEC ) tablet  25 mcg Vaginal Q4H PRN Braun, Gretchen, CNM        NS 250 mL flush bag   Intravenous Q15 Min PRN Braun, Gretchen, CNM        NS flush syringe  2-6 mL Intracatheter Q8HRS Braun, Gretchen, CNM        NS flush syringe  2-6 mL Intracatheter Q1 MIN PRN Braun, Gretchen, CNM        ondansetron (ZOFRAN) 2 mg/mL injection  4 mg Intravenous Q8H PRN Braun, Gretchen, CNM         Allergies[1]    REVIEW OF SYSTEMS: Other than ROS in the HPI, all other systems were negative.    Objective:   BP 139/85   Pulse 93   Resp 16   Ht 1.676 m (5' 6)   Wt 113 kg (249 lb 1.9 oz)   LMP 02/27/2023   BMI 40.21 kg/m         General:     Appears well. No acute distress.    HEENT:  Normocephalic.    Lungs:  Easy, unlabored breathing.    Heart:  Appears well perfused, color pink in room air   Abdomen:  Gravid, soft, non-tender.   Extremities:  Extremities normal, atraumatic, no cyanosis or edema.    Neurologic:  Oriented, normal appropriate mood.      FHTs: baseline 140, moderate variability, + accels, no decels  UCs: irregular    Assessment:     IUP at [redacted]w[redacted]d   GBS negative  Rh negative  Rubella immune  Prepregnancy BMI 39  Hx of graves disesase s/p ablation  Hx of GHTN  Stable maternal/fetal status    Plan:     Admit to L&D for IOL  Diet: Regular  Continuous Toco/EFM  LR @ 125 ml/hour  GBS negative  Rhogam eval postpartum  Pain management: all natural  Continue unithroid   HELLP labs  Anticipate NSVD    The patient was evaluated and treated independently  per APRN scope of practice and licensure.   Truman  Braun, CNM          [1] No Known Allergies

## 2023-12-08 NOTE — Anesthesia Procedure Notes (Signed)
 [name]    Lumbar Epidural   Performed by:    Performing Provider:  Jeannetta Kast, DO  Authorizing Provider:  Velta Cough, MD      Indication: pain relief in labor and delivery        Pt location: At bedside  Technique: ( See MAR for exact doses)  Technique/Approach: midline        Needle Level: L3-4  Sterile Skin Prep : cap, hand hygiene performed, sterile gloves, mask, sterilely prepped and draped, sterile field established, sterile drape and sterile technique Preprocedure hand washing was performed sterile field maintained     Pre anesthesia checklist: H&P updated and consent obtained, Patient positioned, Patient monitors applied, Timeout performed:, Site verified, Emergency drugs and equipment available and anesthesia consent given   Patient position: sitting   Skin local: Lidocaine  1%   Needle/Catheter: Needle type: Tuohy   Needle Gauge: 18 G and 18  Needle length: 3.5 in  Epidural Injection Technique LOR saline  Needle insertion depth 8 cm Catheter length in space: 5 cm   Catheter at skin depth: 13 cm  Epidural catheter location: lumbar (1-5)  Number of attempts: 1  Events: neg aspiration and no complications,         Dosing:      Test Dose: 3 mL,  lidocaine  1.5% with epinephrine 1:200,000       Negative test - not INTRAVENOUS  and Negative test - not Subarachnoid   Injection made incrementally with aspirations every 5mL .  Catheter: secured and sterile dressing applied           NOTES: Kast Jeannetta, DO  [phone]

## 2023-12-09 ENCOUNTER — Ambulatory Visit: Payer: 59 | Admitting: INTERNAL MEDICINE-ENDOCRINOLOGY-DIABETES AND METABOLISM

## 2023-12-09 ENCOUNTER — Encounter (HOSPITAL_BASED_OUTPATIENT_CLINIC_OR_DEPARTMENT_OTHER): Payer: Self-pay | Admitting: INTERNAL MEDICINE-ENDOCRINOLOGY-DIABETES AND METABOLISM

## 2023-12-09 LAB — TEST FOR FETAL CELLS: FETALDEX (KLEIHAUER BETKE STAIN): NONE SEEN

## 2023-12-09 LAB — RHIGG FOR INJECTION

## 2023-12-09 NOTE — Progress Notes (Deleted)
  MEDICINE  Virtual visit      Name: [name]  MRN: [id number]  DOB: 03-Jul-1990    Date of Service:  12/09/2023    Last appointment was in April 2023.     TELEMEDICINE DOCUMENTATION:  Patient Location:  MyChart video visit from home address: [address]  [facility] [address]   Patient/family aware of provider location:  yes  Patient/family consent for telemedicine:  yes  Examination observed and performed by:  Gregory Grates, MD    Problem List:     1.  Post radioactive iodine ablation hypothyroidism. (December 2018)  2.  Delivered healthy baby girl on July 07, 2021. ( vaginal delivery)        Breastfeeding.     Last Plan:     Dose reduced after delivery to 112 mcg of levothyroxine  daily.     Review:     Reports taking the medication in the morning on empty stomach.   Taking brand-name Synthroid   Patient reports she has lost most of her pregnancy weight.    Patient reports she is watching her diet and trying to lose weight, going to the gym on a regular basis.  However so far she has not seen the results.     Currently she denies any palpitations, tremors, irritability.  Able to sleep most nights uninterrupted.   Denies problems with constipation or diarrhea.     Last thyroid  blood work done in January 2024 reviewed.     THYROID  STIMULATING HORMONE  Lab Results   Component Value Date    TSH 1.088 09/06/2023       History of Present Illness:      [name] is a 33 y.o. female who presents today for follow up of post radioactive iodine ablation hypothyroidism. Patient is currently [redacted] weeks pregnant with expected due date of June 16th. Reports that recent pregnancy scan was good. She will have weekly monitoring starting on week 32 and at least 3 more scans. She is still taking 112 mcg of levothyroxine  1 pill x6 days and 1.5 pills on Sunday. Taking medication in the morning on an empty stomach. Taking prenatals at night. Patient is planning to breastfeed. She is not planning  to start contraceptive immediately after delivery. Reports that she's feeling okay with occasional morning sickness.     Patient Active Problem List    Diagnosis    Encounter for induction of labor    History of gestational hypertension    Pregnancy    History of Graves' disease  Hypothyroidism    Obesity  PCOS    Rh negative state in antepartum period     Past Medical History:   Diagnosis Date    COVID     Family history of breast cancer in first degree relative     patient's mom and she is unsure of BRCA gene    Gestational hypertension 11/01/2019    Graves disease     PCOS (polycystic ovarian syndrome)     Personal history of other medical treatment     patient did have some baby blues after each daughter and no med needed    Seasonal allergic reaction     occasional OTC meds    SVD (spontaneous vaginal delivery) 07/08/2021    [name] presented to L&D on the morning of 07/07/2020 in early active labor with contractions that had increased in frequency and intensity over night.  She progressed without augmentation to SROM at 1525 and to  SVD over a small vaginal laceration at 1602.  Baby girl, [name] weighed 3150 gm and had APGARS of 8-9.  Vaginal laceration was repaired under local anesthetic with 3.0 Vicryl.  EBL 250 mL.           Past Surgical History:   Procedure Laterality Date    HX WISDOM TEETH EXTRACTION  2010         No current outpatient medications on file.     No Known Allergies  Family Medical History:       Problem Relation (Age of Onset)    Alzheimer's/Dementia Paternal Grandmother    Breast Cancer Mother    Dementia Mother (62)    Eclampsia Sister    Healthy Daughter, Daughter, Daughter    Hypertension (High Blood Pressure) Maternal Grandmother, Mother    Liver Disease Father    No Known Problems Maternal Grandfather, Brother, Brother    Polycystic ovary syndrome Sister    Spont Ab Mother, Sister    Stroke Paternal Grandfather, Maternal Grandmother              Social History     Tobacco Use     Smoking status: Never     Passive exposure: Never    Smokeless tobacco: Never   Substance Use Topics    Alcohol use: Not Currently       Review of Systems:  Constitutional: negative   Denies tachycardia, tremors, weight loss.   Denies heat or cold intolerance.   Reports hair thinning and hair loss in the last 6 months.   Denies diarrhea.     Physical Exam:    Complete physical exam was not done since this is the virtual visit.     Thyromegaly noted on exam today.   No tremors on outstretched hands.     Assessment/Plan:    1. Post RAI ablation hypothyroidism.     Delivered healthy baby in January 2023. Vaginal delivery  Breastfeeding   Taking generic levothyroxine  112 mcg daily.  Taking the medication correctly in the morning on empty stomach.   Repeat TSH tomorrow and again in three months.  Patient is interested in switching from generic to brand name Synthroid .  We will also check iron  studies rule out iron -deficiency anemia  Patient is currently breastfeeding and is not planning on starting any hormonal contraception.     Patient Education:  Serial TSH levels needed to adjust the dose, if she decides to start a birth control pill then update the clinic.  Oral estrogen in the birth control pill will likely was an increase in the levothyroxine  dose.     Standing order of TSH placed.     Return to clinic in 12 months.     GREGORY GRATES, MD     Associate  Professor, Endocrinology.    [facility]  [phone]

## 2023-12-09 NOTE — Care Management Notes (Signed)
 [facility]  Care Management Initial Evaluation    Patient Name: [name]  Date of Birth: 1991/04/24  Sex: female  Date/Time of Admission: 12/08/2023 11:27 AM  Room/Bed: 11/A  Payor: CIGNA / Plan: CIG[employer] NMC / Product Type: Non Managed Care /   Primary Care Providers:  Peri Cohen, APRN,NP-C, APRN,NP* (General)    Pharmacy Info:   Preferred Pharmacy       [facility] [phone] - [place], [place] - [address] AT [facility] [address]    [address] [place] [address]    Phone: [phone] Fax: [phone]    Hours: Not open 24 hours    [facility]    [address] [facility] [place] [address]    Phone: [phone] Fax: [phone]    Hours: 24/7          Emergency Contact Info:   Extended Emergency Contact Information  Primary Emergency Contact: [facility]  Address: [address]           [place], [place] [address] [place]  Home Phone: [phone]  Work Phone: [phone]  Mobile Phone: [phone]  Relation: Husband    History:   [name] is a 33 y.o., female, admitted for elevated BP at home.    Height/Weight: 167.6 cm (5' 6) / 113 kg (249 lb 1.9 oz)     LOS: 1 day   Admitting Diagnosis: Encounter for induction of labor [Z34.90]    Assessment:      12/09/23 1210   Assessment Details   Assessment Type Admission   Date of Care Management Update 12/09/23   Date of Next DCP Update 12/13/23   Readmission   Is this a readmission? No   Insurance Information/Type   Insurance type Commercial   Employment/Financial   Patient has Prescription Coverage?  Yes        Name of Insurance Coverage for Medications [employer]   Financial/Environmental Concerns none   Living Environment   Select an age group to open lives with row.  Adult   Lives With child(ren), dependent;spouse   Living Arrangements house   Able to Return to Prior Arrangements yes   Home Safety   Home  Assessment: No Problems Identified   Home Accessibility no concerns   Custody and Legal Status   Custody Issues? No   Paternity Affidavit Requested? No   Care Management Plan   Discharge Planning Status initial meeting   Projected Discharge Date 12/10/23   Discharge plan discussed with: Patient;Spouse   CM will evaluate for rehabilitation potential yes   Form for patient choice reviewed/signed and on chart no   Discharge Needs Assessment   CPS/CYS/APS Communication   (Not indicated at this time)   Equipment Currently Used at Home none   Equipment Needed After Discharge other (see comments)  (TBD)   Discharge Facility/Level of Care Needs Home (Patient/Family Member/other)(code 1)   Transportation Available car;family or friend will provide   Referral Information   Admission Type inpatient   Address Verified verified-no changes   Arrived From admitted as an inpatient   ADVANCE DIRECTIVES   Does the Patient have an Advance Directive? No, Information Offered and Refused     Patient is 33yo female admitted for elevated BP at home. PP day 1 s/p SVD/IOL.    Patient: [name] [phone]  Husband/FOB: John Malson [phone]    MSW met with patient and husband  at bedside to complete initial assessment.    Home address: [address], [place], [place] [address]  Residing in home: Patient, husband, their children Leota - 9yo, Zoey - 4yo, [name] - 34yo)  Patient employment: [employer]  Husband employment: [employer]  Paternity affidavit anticipated: NA - married  Infant is being named: Counsellor  Reliable transportation: Yes  WIC/SNAP: No/No  Patient's medical insurance: [employer] plan infant planned to be added to: [place]  Pediatrician: CLP  Pharmacy: [employer] plan: Breast  Breast pump needed: No - already obtained   Has all needed items to care for infant adequately: Yes  Substance/alcohol use: Dietitian or financial concerns: Denied  CPS involvement: Denied    Discharge Plan:  Home  (Patient/Family Member/other) (code 1)  Anticipate DC when medically appropriate.    The patient will continue to be evaluated for developing discharge needs.     Case Manager: Aloha DELENA Molt, SOCIAL WORKER  Phone: [phone]

## 2023-12-09 NOTE — Care Plan (Signed)
 [facility]  Rehabilitation Services  Occupational Therapy Initial Evaluation    Patient Name: [name]  Date of Birth: 01-Mar-1991  Height: Height: 167.6 cm (5' 6)  Weight: Weight: 113 kg (249 lb 1.9 oz)  Room/Bed: 11/A  Payor: [employer] / Plan: C[employer] [facility] / Product Type: Non Managed Care /     Assessment:   Ylonda tolerated today's OT evaluation well this date. Maryssa is a [id number] PPD 1 s/p SVD with small midline vaginal laceration. She reports feeling very well and having minimal soreness at this time. She reports independence in all ADLs and functional mobility tasks since delivery. She declined needs at this time, but was agreeable to education. Patient was agreeable and receptive to postpartum education and tasks to promote a functional postpartum recovery. Any postpartum care concerns were addressed during the session, and provided education in written format as well. Once medically stable, patient would be appropriate to d/c home with assist to promote maximum safety and independence in all ADLs/IADLs and functional tasks. Will sign off from an OT perspective.     Discharge Needs:   Equipment Recommendation: none anticipated      Discharge Disposition: home with assist    JUSTIFICATION OF DISCHARGE RECOMMENDATION   Based on current diagnosis, functional performance prior to admission, and current functional performance, this patient requires continued OT services in home with assist  in order to achieve significant functional improvements.    Plan:   Current Intervention:      To provide Occupational therapy services Evaluation Only, evaluation only.       The risks/benefits of therapy have been discussed with the patient/caregiver and he/she is in agreement with the established plan of care.       Subjective & Objective      12/09/23 1010   Therapist Pager   OT Assigned/ Pager # Janalyn [phone]   Rehab Session   Document Type evaluation   OT Visit Date 12/09/23   Total OT Minutes: 8    Patient Effort good   Symptoms Noted During/After Treatment none   General Information   Patient Profile Reviewed yes   Onset of Illness/Injury or Date of Surgery 12/08/23   General Observations of Patient supine in bed   Patient/Family/Caregiver Comments/Observations agreeable to OT session   Pertinent History of Current Functional Problem [id number] PPD 1 s/p SVD with  small midline vaginal laceration   Medical Lines PIV Line   Respiratory Status room air   Existing Precautions/Restrictions full code   Pre Treatment Status   Pre Treatment Patient Status Patient supine in bed;Call light within reach;Telephone within reach   Support Present Pre Treatment  Family present   Communication Pre Treatment  Nurse   Communication Pre Treatment Comment RN approved session   Mutuality/Individual Preferences   Anxieties, Fears or Concerns none stated   Individualized Care Needs up ad lib   Patient-Specific Goals (Include Timeframe) to go home   Plan of Care Reviewed With patient   Living Environment   Lives With child(ren), dependent;spouse   Living Arrangements house   Living Environment Comment pt lives with her husband and kids ages 26, 68 and 2 in a house with 8 STE and 12 steps to the bedroom and bathroom   Functional Level Prior   Ambulation 0 - independent   Transferring 0 - independent   Toileting 0 - independent   Bathing 0 - independent   Dressing 0 - independent   Eating 0 - independent  Communication 0 - understands/communicates without difficulty   Prior Functional Level Comment is also a SAH mom   Self-Care   Current Activity Tolerance good   Equipment Currently Used at Home no   Vital Signs   O2 Delivery Pre Treatment room air   O2 Delivery Post Treatment room air   Vitals Comment no s/s of distress   Pain   Additional Documentation Pain Scale: Word Pre/Post-Treatment (Group)   Pain Scale: Word Pre/Post-Treatment   Pre/Posttreatment Pain Comment no c/o pain   Coping/Psychosocial   Observed Emotional State  calm;cooperative   Verbalized Emotional State acceptance   Family/Support System   Family/Support Persons family   Involvement in Care at bedside   Coping/Psychosocial Response Interventions   Plan Of Care Reviewed With patient   Supportive Measures active listening utilized;decision-making supported;goal setting facilitated   Trust Relationship/Rapport care explained;questions answered   Cognition   Behavior/Mood Observations alert;cooperative   Orientation Status oriented x 4   Attention WNL/WFL   Follows Commands WNL   RUE Assessment   RUE Assessment WFL- Within Functional Limits   LUE Assessment   LUE Assessment WFL- Within Functional Limits   Mobility Assessment/Training   Mobility Comment pt declined mobility needs/assessment at this time; provided education for recommended PP mobility schedule and how to complete transfers with abdominal protection techniques; discussed s/s of distress during mobility   ADL Assessment/Intervention   ADL Comments pt declined ADL needs/assessment at this time; discussed how to complete ADLs safely and with abdominal protection techniques; provided/discussed AE options as needed and s/s of distress during ADLs and how to modify   Balance   Comment did not assess   Post Treatment Status   Post Treatment Patient Status Patient supine in bed;Call light within reach;Telephone within reach   Support Present Post Treatment  Family present   Clinical Impression   Functional Level at Time of Session Jazell tolerated today's OT evaluation well this date. Lillyauna is a [id number] PPD 1 s/p SVD with small midline vaginal laceration. She reports feeling very well and having minimal soreness at this time. She reports independence in all ADLs and functional mobility tasks since delivery. She declined needs at this time, but was agreeable to education.   Criteria for Skilled Therapeutic Interventions Met (OT) no;no problems identified which require skilled intervention   Therapy Frequency Evaluation  Only   Predicted Duration of Therapy evaluation only   Anticipated Equipment Needs at Discharge none anticipated   Anticipated Discharge Disposition home with assist   Evaluation Complexity Justification   Occupational Profile Review Expanded review   Performance Deficits None   Clinical Decision Making Low analytic complexity   Evaluation Complexity Low     Objective assessment- patient declined ADL and functional mobility needs/practice sessions at this time; continued to provide education for how to complete self-care tasks safely   Education   Diaphragmatic breathing    Patient was educated on the importance of diaphragmatic breathing for abdominal protection and pelvic floor activation. Patient was educated on how to complete diaphragmatic breathing during ADLs, transfers, and newborn care tasks.   AE  Patient was educated on adaptive equipment that could promote independence and reduce pain during ADLs upon discharge. Patient was educated on how to use each piece of equipment and where to purchase if warranted. Patient was provided with AE during session as needed.   Vitals and medical warning signs   Patient was educated on the importance of physical signs and symptoms of distress that  could indicate a medical problem. Patient was also encouraged to borrow or purchase a BP monitor to evaluate BP at home as needed during rest and activity, and to call their provider if BP is higher or lower than baseline.   Pelvic floor    Patient was educated on simple pelvic floor education and exercises to begin upon discharge to promote pelvic floor activation and recovery after birth. Pelvic floor education includes diaphragmatic breathing, kegals, and the function of the pelvic floor. Patient was also encouraged to seek out pelvic floor therapy after 6 weeks postpartum/cleared by MD if any signs or symptoms of incontinence, heaviness, weakness, urgency, pain, constipation arise.   Lifting and abdominal protection    Patient was educated on proper lifting and abdominal protection techniques to complete during self and newborn care. Patient was provided on education and demonstrations on how to complete self and newborn care while protecting her abdomen.   Seating and positioning   Patient was educated on proper seating and positioning to reduce pain and to prevent musculoskeletal injuries. Patient was educated on proper newborn feeding techniques to prevent neck and back pain, and provided a list of cushions to use for comfort as well.   Mental health   Patient was educated on expected and unexpected changes in mental health during the postpartum period. Patient was provided a handout of typical mental health and hormone changes, and educated on the signs and symptoms of postpartum depression and anxiety. Patient encouraged to immediately call their provider if having thoughts of self or newborn harm.   Scar management   Patient was educated on scar management techniques to begin at 6 weeks postpartum/cleared by MD. Patient provided with a handout of how to complete scar mobilization and healing techniques.   Stretching   Patient was provided with education and a handout of simple postpartum stretching to promote strengthening, lengthening, and pelvic floor activation. Patient was encouraged to stop stretches if an increase in pain occurred.   Energy conservation  Discussed breaking tasks down into smaller tasks to conserve energy and maintain lifting restrictions. Discussed how to complete IADLs with safe body mechanics and abdominal protection techniques.   **All education was provided in written format in the [facility] Medicine Postpartum Recovery Guide   Therapist:   Janalyn Phlegm, OTD, MOT, OTR/L, [facility]   Pager: [phone]

## 2023-12-09 NOTE — Progress Notes (Signed)
   [facility]   OB POSTPARTUM PROGRESS NOTE:  CNM      [name], [name], 33 y.o. female  Date of Admission:  12/08/2023  Date of Service: 12/09/2023  Date of Birth:  [birth year]    Hospital Day:  LOS: 1 day     Chief Complaint: PP Day 1 s/p SVD/IOL due to BMI 40    Subjective:   [name] is doing well this morning. Husband went home this morning to be with their other children. She has been ambulating and voiding appropriately without difficulty or assistance; no dizziness with ambulation. Passing flatus. Lochia light, less than 1 pad per hour. Cramping mild and well managed with PRN ibuprofen .     Baby Feeding Method: Breastfeeding well    Vital Signs:  Temp (24hrs) Max:36.9 C (98.4 F)    Temperature: 36.9 C (98.4 F)  BP (Non-Invasive): 137/81  MAP (Non-Invasive): 104 mmHG  Heart Rate: 90  Respiratory Rate: 16  SpO2: 94 %  Filed Vitals:    12/08/23 2209 12/08/23 2218 12/09/23 0050 12/09/23 0429   BP: (!) 144/66  118/68 137/81   Pulse: 83  88 90   Resp: 16  18 16    Temp:  36.8 C (98.2 F) 36.8 C (98.2 F) 36.9 C (98.4 F)   SpO2:         Rh: Negative, Rubella:Immune, GBS: Negative,     Today's Physical Exam:  General Appearance: Alert, appropriate appearance for age. No acute distress  Heart: Appears well perfused.   Lungs: Respirations unlabored.  Breasts: Exam deferred. She has no concerns.   Abdomen: Soft, non-tender; fundus firm, non-tender, midline, 1 below umbilicus   Perineum/Lochia: Exam deferred. She has no concerns. No stitches needed.   LE: Trace edema bilaterally; no erythema, cyanosis, or excessive warmth.  Mood: Affect appropriate; Happy with birth experience.     Current Medications:  acetaminophen  (TYLENOL ) tablet, 650 mg, Oral, Q4H PRN  calcium carbonate (TUMS) 500mg  (200mg  elemental calcium) chewable tablet, 500 mg, Oral, 3x/day PRN  D5W 250 mL flush bag, , Intravenous, Q15 Min PRN  docusate sodium  (COLACE) capsule, 100 mg, Oral, 2x/day PRN  ibuprofen  (MOTRIN )  tablet, 600 mg, Oral, Q6H  levothyroxine  (SYNTHROID ) tablet, 112 mcg, Oral, Once per day on Monday Tuesday Wednesday Thursday Friday  [START ON 12/11/2023] levothyroxine  (SYNTHROID ) tablet, 224 mcg, Oral, Once per day on Sunday Saturday  NS 250 mL flush bag, , Intravenous, Q15 Min PRN  NS flush syringe, 2-6 mL, Intracatheter, Q8HRS  NS flush syringe, 2-6 mL, Intracatheter, Q1 MIN PRN  rho D immune globulin  (RHOGAM) 300 mcg IM injection, 300 mcg, IntraMUSCULAR, Once  witch hazel-glycerin  (TUCKS PADS) 12.5 %-50 % topical pads, , Apply externally, Q1H PRN    Labs  (Please indicate ordered or reviewed)  Reviewed: I have reviewed all lab results.      Assessment:   PP Day 1 s/p SVD  Stable  Hx of Graves Disease/Hypothyroid  Rh Negative (Baby Rh Positive)  Breast feeding    Plan:  Postpartum teaching initiated.  Rhogam given 7/2 @ 2200.  Support breastfeeding.   Encourage rest. Discussed importance of healthy nutrition and adequate hydration.   Anticipate discharge tomorrow if continued stable.  Follow up in 2 weeks for mood check and 6 weeks with CNM or RTO sooner prn.    Katie Rigg, APRN-CNM    CNM saw patient independently.  Physician available on-site for consultation, collaboration, or referral as per CNM certification and licensure.  Buddie Marker, MD 12/09/2023 09:53  [place]  [place]  Department of Obstetrics & Gynecology

## 2023-12-09 NOTE — Pharmacy (Signed)
 Pharmacy Medication Reconciliation    Patient Name: [name], [name]  Date of Service: 12/08/2023  Date of Admission: 12/08/2023  Date of Birth: [birth year]  Length of Stay:   0 days     Transitions of Care:  1. Would you like to utilize the [facility] Medicine Discharge Pharmacy?  Yes    Information was collected from:  Patient, Pharmacy, and Previous Records  Spoke with Kai: [phone]  [facility]  - [place], [place]: [phone]    Clarified Prior to Admission Medications:  Prior to Admission medications    Medication Sig Taking Resumed Y/N (RPh) Comments   CHOLECALCIFEROL, VITAMIN D3, ORAL   Take 1 Tablet by mouth Daily Yes  No  OTC    docosahexaenoic acid/epa (FISH OIL ORAL) Chew 2 Gummies Four times a day   Yes  No  OTC    doxylamine succinate (UNISOM, DOXYLAMINE, ORAL) Take 1 Tablet by mouth Every night as needed     PRN  No  OTC    FERROUS SULFATE ORAL   Take 1 Tablet by mouth Daily Yes  No  OTC    MAGNESIUM ORAL     Chew 2 Gummies Every evening Yes  No  OTC    prenatal vits62/FA/om3/dha/epa (PRENATAL GUMMY ORAL)   Chew 2 Gummies Once a day Yes  No     pyridoxine HCl, vitamin B6, (PYRIDOXINE, VITAMIN B6, ORAL)   Take 1 Tablet by mouth Three times a day Yes  No  OTC    UNITHROID  112 mcg Oral Tablet Take 1 tablet (112 mcg) 5 days a week and then Sat and Sun take 2 tablets (224 mcg).   Yes  Yes  Filled 5/28 x90 days.        Did patient's home medication list require updates or clarifications? Yes    Medications UPDATED on Prior to Admission Med List:  - Removed Vitamin D 1,000 units  - Removed Unisom 25 mg Nightly  - Removed Ferrous Sulfate 325 mg Daily  - Removed Magnesium Citrate One time dose  - Prenatal tablet to gummy  - Removed Vitamin B6 50 mg Daily  - Removed Terconazole  Vaginal Cream filled 5/28 - therapy completed    Medications ADDED to Prior to Admission Med List:  - Vitamin D3 Oral (unknown drug strength)  - Fish Oil Oral (unknown drug strength)  - Unisom Oral (unknown drug strength)  -  Ferrous Sulfate Oral (unknown drug strength)  - Magnesium Oral (unknown drug strength)  - Vitamin B6 Oral (unknown drug strength)    Prior to Admission Medications Being Held and Rationale:  - Vitamin D  - Fish Oil  - PRN Unisom  - Ferrous Sulfate  - Magnesium  - Prenatal  - Vitamin B6    Other Medication Discrepancies from Home Medication List:  None.    Allergies:    No Known Allergies    Jeoffrey Richard, Pharmacy Technician 12/08/23 at 13:39    Rosina Kitty, PHARMD

## 2023-12-10 ENCOUNTER — Other Ambulatory Visit: Payer: Self-pay

## 2023-12-10 LAB — RHIG FOR TRANSFUSION: UNIT DIVISION: 0

## 2023-12-10 MED ORDER — IBUPROFEN 600 MG TABLET
600.0000 mg | ORAL_TABLET | Freq: Four times a day (QID) | ORAL | 1 refills | Status: DC
Start: 2023-12-10 — End: 2024-02-22
  Filled 2023-12-10: qty 60, 15d supply, fill #0

## 2023-12-10 MED ORDER — DOCUSATE SODIUM 100 MG CAPSULE
100.0000 mg | ORAL_CAPSULE | Freq: Two times a day (BID) | ORAL | 1 refills | Status: DC | PRN
Start: 2023-12-10 — End: 2024-02-22
  Filled 2023-12-10: qty 60, 30d supply, fill #0

## 2023-12-10 NOTE — Care Plan (Signed)
 Problem: Adult Inpatient Plan of Care  Goal: Patient-Specific Goal (Individualized)  Recent Flowsheet Documentation  Taken 12/10/2023 0830 by Robbi SAUNDERS, RN  Individualized Care Needs: To be updated on plan of care for the day  Anxieties, Fears or Concerns: None at this time  Goal: Absence of Hospital-Acquired Illness or Injury  Intervention: Identify and Manage Fall Risk  Recent Flowsheet Documentation  Taken 12/10/2023 0830 by Robbi SAUNDERS, RN  Safety Promotion/Fall Prevention: safety round/check completed  Intervention: Prevent Skin Injury  Recent Flowsheet Documentation  Taken 12/10/2023 0830 by Robbi SAUNDERS, RN  Body Position: supine, head elevated  Skin Protection: adhesive use limited  Intervention: Prevent and Manage VTE (Venous Thromboembolism) Risk  Recent Flowsheet Documentation  Taken 12/10/2023 0830 by Robbi SAUNDERS, RN  VTE Prevention/Management: ambulation promoted  Intervention: Prevent Infection  Recent Flowsheet Documentation  Taken 12/10/2023 0830 by Robbi SAUNDERS, RN  Infection Prevention:   barrier precautions utilized   personal protective equipment utilized   promote handwashing  Goal: Optimal Comfort and Wellbeing  Intervention: Provide Person-Centered Care  Recent Flowsheet Documentation  Taken 12/10/2023 0830 by Robbi SAUNDERS, RN  Trust Relationship/Rapport:   care explained   choices provided   emotional support provided   empathic listening provided   questions answered   questions encouraged   reassurance provided   thoughts/feelings acknowledged

## 2023-12-10 NOTE — Progress Notes (Signed)
 [name]  [facility]   OB POSTPARTUM PROGRESS NOTE:  CNM      [name], [name], 33 y.o. female  Date of Admission:  12/08/2023  Date of Service: 12/10/2023  Date of Birth:  1990-11-14    Hospital Day:  LOS: 2 days     Chief Complaint: s/p NSVD over vag lac without repair; GHTN    Subjective: Patient without complaints - overall feeling well. Ready to go home to other daughters.  Ambulating without assist or dizziness.  Eating with good appetite.  No issues with urination.  + BM.  Bleeding similar to menses, no clots.  Pain well controlled with meds as written.      Baby Feeding Method: Breast    Vital Signs:  Temp (24hrs) Max:36.8 C (98.2 F)    Temperature: 36.4 C (97.6 F)  BP (Non-Invasive): (!) 123/93  MAP (Non-Invasive): 102 mmHG  Heart Rate: 72  Respiratory Rate: 15  SpO2: 94 %    Rh: negative, Rubella:immune, GBS: negative     Today's Physical Exam:  General Appearance: Alert, appropriate appearance for age. No acute distress  Heart: Appears well perfused  Lungs: Easy, unlabored breathing  Breasts: Soft, filling  Abdomen: Soft, non tender  Fundus: Firm, non tender, midline, 1 below U  Perineum/Lochia: Deferred per patient. Small rubra lochia, no clots, no odor  LE: Trace edema bilaterally  Mood: States well    Current Medications:  acetaminophen  (TYLENOL ) tablet, 650 mg, Oral, Q4H PRN  calcium  carbonate (TUMS) 500mg  (200mg  elemental calcium ) chewable tablet, 500 mg, Oral, 3x/day PRN  D5W 250 mL flush bag, , Intravenous, Q15 Min PRN  docusate sodium  (COLACE) capsule, 100 mg, Oral, 2x/day PRN  ibuprofen  (MOTRIN ) tablet, 600 mg, Oral, Q6H  levothyroxine  (SYNTHROID ) tablet, 112 mcg, Oral, Once per day on Monday Tuesday Wednesday Thursday Friday  [START ON 12/11/2023] levothyroxine  (SYNTHROID ) tablet, 224 mcg, Oral, Once per day on Sunday Saturday  NS 250 mL flush bag, , Intravenous, Q15 Min PRN  NS flush syringe, 2-6 mL, Intracatheter, Q8HRS  NS flush syringe, 2-6 mL, Intracatheter, Q1 MIN  PRN  witch hazel-glycerin  (TUCKS PADS) 12.5 %-50 % topical pads, , Apply externally, Q1H PRN        Assessment:  33 year old s/p NSVD over vag lac without repair  GHTN  Breastfeeding  Rh negative  Graves disease s/p ablation - hypothyroidism   Plans withdrawal for contraception  Hx of baby blues    Plan:  Postpartum teaching completed.  Reviewed BP warning signs to report.  Support breastfeeding.  Rhogam given.  TSH at 6wk.  Reviewed contraceptive plan.   Encourage rest.  Anticipate discharge today if continued stable.  Follow up 1 and 6 weeks with [facility] CNM or RTO sooner prn.    Kayla Pomp-Steurer, APRN,M[facility]WIFE    CNM saw patient independently.  Physician available on-site for consultation, collaboration, or referral as per CNM certification and licensure.     Buddie Marker, MD 12/10/2023 13:11  [place]  [place]  Department of Obstetrics & Gynecology

## 2023-12-10 NOTE — Anesthesia Postprocedure Evaluation (Signed)
 Anesthesia Post Op Evaluation    Patient: [name]  ANES - LABOR ANALGESIA    Last Vitals:Temperature: 36.6 C (97.9 F) (12/10/23 0328)  Heart Rate: 83 (12/10/23 0328)  BP (Non-Invasive): (!) 133/90 (12/10/23 0328)  Respiratory Rate: 15 (12/10/23 0328)  SpO2: 94 % (12/08/23 1956)    No notable events documented.      Patient location during evaluation: bedside       Patient participation: complete - patient participated  Level of consciousness: awake and alert  Multimodal Pain Management: Multimodal analgesia used between 6 hours prior to anesthesia start to PACU discharge  Pain management: satisfactory to patient  Airway patency: patent    Anesthetic complications: no  Cardiovascular status: hemodynamically stable  Respiratory status: acceptable  Hydration status: acceptable  Patient post-procedure temperature: Pt Normothermic   PONV Status: Absent  Comments: Pt denies headache, lower extremities paresthesisa or weakness, urinary retention, or significant back pain. Pt is ambulating without any issues.     Bernardino Sables, MD        I saw and examined the patient.  I reviewed the resident's note.  I agree with the findings and plan of care as documented in the resident's note.  Any exceptions/additions are edited/noted.    Ozell JAYSON Molt, MD

## 2023-12-10 NOTE — Discharge Summary (Signed)
 [facility]  DISCHARGE SUMMARY    PATIENT NAME:  Martika, Egler  MRN:  [id number]  DOB:  04/07/1991    ENCOUNTER DATE:  12/08/2023  INPATIENT ADMISSION DATE: 12/08/2023  DISCHARGE DATE:  12/10/2023    ATTENDING PHYSICIAN: Tilford Lever, DO  SERVICE: OBSTETRICS  PRIMARY CARE PHYSICIAN: Henri Clarity, APRN,NP-C       No lay caregiver identified.    PRIMARY DISCHARGE DIAGNOSIS: Encounter for induction of labor  Active Hospital Problems   No active problems to display.      Resolved Hospital Problems    Diagnosis     Principal Problem: Encounter for induction of labor [Z34.90]      Active Non-Hospital Problems    Diagnosis Date Noted    History of gestational hypertension 05/11/2023    History of Graves' disease  Hypothyroidism 05/29/2019    Obesity  PCOS 05/29/2019    Rh negative state in antepartum period 10/28/2013             Current Discharge Medication List        START taking these medications.        Details   docusate sodium  100 mg Capsule  Commonly known as: COLACE   100 mg, Oral, 2 TIMES DAILY PRN  Qty: 60 Capsule  Refills: 1     Ibuprofen  600 mg Tablet  Commonly known as: MOTRIN    600 mg, Oral, EVERY 6 HOURS  Qty: 60 Tablet  Refills: 1            CONTINUE these medications - NO CHANGES were made during your visit.        Details   CHOLECALCIFEROL (VITAMIN D3) ORAL   1 Tablet, Oral, Daily  Refills: 0     FERROUS SULFATE ORAL   1 Tablet, Oral, Daily  Refills: 0     FISH OIL ORAL   Chew 2 Gummies Four times a day  Refills: 0     MAGNESIUM ORAL   Chew 2 Gummies Every evening  Refills: 0     PRENATAL GUMMY ORAL   Chew 2 Gummies Once a day  Refills: 0     Unithroid  112 mcg Tablet  Generic drug: levothyroxine    Take 1 tablet (112 mcg) 5 days a week and then Sat and Sun take 2 tablets (224 mcg).  Qty: 135 Tablet  Refills: 3            STOP taking these medications.      PYRIDOXINE (VITAMIN B6) ORAL     UNISOM (DOXYLAMINE) ORAL            Discharge med list refreshed?  YES      Allergies[1]  HOSPITAL PROCEDURE(S):   No orders of the defined types were placed in this encounter.      REASON FOR HOSPITALIZATION AND HOSPITAL COURSE   BRIEF HOSPITAL NARRATIVE:   Charmel Melissa Dzik is a 33 y.o. now [id number] who presented to Labor & Delivery on 12/08/2023 for elevated BP at home and feeling unwell. Past medical history was complicated by Graves disease s/p ablation with hypothyroid. Prenatal course was complicated by BMI 39, GHTN, and Rh negative. Induction was started with Pitocin . Utilized an epidural for pain management. A viable female neonate was delivered on 12/08/2023 via NSVD at [redacted]w[redacted]d weighing 3245g with apgars 8 & 9. Postpartum course was uncomplicated and patient met all postpartum milestones appropriately. She plans withdrawal for contraception. The patient was discharged on postpartum day 2 in stable  condition with 1-week BP check and 6-week postpartum follow up.     TRANSITION/POST DISCHARGE CARE/PENDING TESTS/REFERRALS: 2w/6w    CONDITION ON DISCHARGE:  A. Ambulation: Full ambulation  B. Self-care Ability: Complete  C. Cognitive Status Alert and oriented x 3  D. Code status at discharge:       LINES/DRAINS/WOUNDS AT DISCHARGE:   Patient Lines/Drains/Airways Status       Active Line / Dialysis Catheter / Dialysis Graft / Drain / Airway / Wound       None                    DISCHARGE DISPOSITION:  Home discharge  DISCHARGE INSTRUCTIONS:  Post-Discharge Follow Up Appointments       Follow up with OB/GYN, [facility]    Phone: [phone]    Where: [address] [place] [address]    Follow up with OB/GYN, [facility]    Phone: [phone]    Where: [address] [place] [address]             THYROID  STIMULATING HORMONE WITH FREE T4 REFLEX     Release to patient Automated [100001]      DISCHARGE INSTRUCTION - MISC    Please check your blood pressure 2 times daily after resting for 15 minutes  Check in the morning, afternoon and before  bed  Call [phone] for values consistently greater than 140/90 and go to hospital if greater than 160/110  Call with headache not relieved with medication/rest or visual changes, right upper belly pain, chest pain or sudden onset shortness of breath    Call [phone] for any of the following symptoms:  Fever of 100.5 or higher on two occassions  Red, warm painful area in either breast  Urgency, frequency, burning, pain on urination small amounts  Warm, tender area on either leg  Feelings of depression, irritability or anxiety  Vaginal bleeding more than one pad an hour  All postpartum patients are at risk of developing preeclampsia, a blood pressure disorder unique to pregnancy. Common symptoms include: severe pain in the right upper abdomen, vision changes (spots/ flashes), sudden increase in swelling    Post Partum Instuctions:  Nothing in the vagina for 6 weeks  Use ibuprofen  (Motrin ) to help with cramping pain  If you do have intercourse prior to your postpartum visit, use protection!  If you are planning on an IUD for contraception, please abstain from intercourse until after placement    Normal Postpartum Recovery  Bleeding can stop before discharge  Bleeding can also last for 3 months  If breastfeeding, milk should come in on about day 3  If you have stitches near your vagina, they will dissolve on their own, you may notice them in the toilet, this is OK    Medications  Continue taking your prenatal vitamins for 6 weeks or until finished breastfeeding (these are also great vitamins to take forever!)  If you are on baby aspirin, please take this until your 6 week post partum visit, this is to protect you from a blood clot  If you are on an iron  supplement, please take this until your 6 week post partum visit  For increased pain, you can alternate tylenol  650 mg every 6 hours and ibuprofen  600-800 mg every 6 hours (alternate these every 3 hours)  Tucks pads, dermoplast and ice packs will help a sore  bottom  Use your sitz bath and/or peri bottle  FOLLOW-UP: OB/GYN - [place] [facility] [place], [place]     Reason for visit: RETURN VISIT    Follow-up in: 6 WEEKS    Provider: CNM      FOLLOW-UP: OB/GYN - [place] - [place], [place]     Reason for visit: RETURN VISIT    Follow-up in: 1 WEEK    Provider: CNM           Ileana San, APRN,MIDWIFE    Copies sent to Care Team         Relationship Specialty Notifications Start End    Peri Cohen, [place] PCP - General NURSE PRACTITIONER-ADULT HEALTH Admissions 01/22/22     Phone: [phone] Fax: [phone]         [address] [facility] [address]            Referring providers can utilize [email or web address] to access their referred [facility] patient's information.            CNM saw patient independently.  Physician available on-site for consultation, collaboration, or referral as per CNM certification and licensure.     Buddie Marker, MD 12/10/2023 13:10  [facility]  [place]  Department of Obstetrics & Gynecology                    [1] No Known Allergies

## 2023-12-10 NOTE — Nurses Notes (Signed)
 RN at bedside for discharge instructions and education. Questions encouraged an answered at this time. AVS was printed off and given to the patient. Patient currently denies any further questions or concerns. Patient is discharged home.     Dyke Brackett, RN

## 2023-12-11 ENCOUNTER — Ambulatory Visit (HOSPITAL_COMMUNITY): Payer: Self-pay

## 2023-12-13 ENCOUNTER — Ambulatory Visit (HOSPITAL_BASED_OUTPATIENT_CLINIC_OR_DEPARTMENT_OTHER): Payer: Self-pay | Admitting: Family

## 2023-12-15 ENCOUNTER — Ambulatory Visit: Payer: Self-pay | Attending: Advanced Practice Midwife | Admitting: Advanced Practice Midwife

## 2023-12-15 ENCOUNTER — Encounter (HOSPITAL_BASED_OUTPATIENT_CLINIC_OR_DEPARTMENT_OTHER): Payer: Self-pay | Admitting: Advanced Practice Midwife

## 2023-12-15 ENCOUNTER — Other Ambulatory Visit: Payer: Self-pay

## 2023-12-15 NOTE — Progress Notes (Signed)
 12/15/2023    SVD 12/2023 3245 gm female    B/P has been a lttle elevated:  highest 150/105 but this is not typical.  Most 120's-140's/80-90's    Was not prescribed antihypertensives    Mostly bothered by anxiety--worrying about things she doesn;t even need to be worried about    Otherwise well    Vitals today:  BP (!) 140/95 (Patient Position: Sitting)   Ht 1.676 m (5' 6)   Wt 107 kg (236 lb 8.9 oz)   LMP 02/27/2023   Breastfeeding Yes   BMI 38.18 kg/m     Current Outpatient Medications   Medication Sig    CHOLECALCIFEROL, VITAMIN D3, ORAL Take 1 Tablet by mouth Daily    docosahexaenoic acid/epa (FISH OIL ORAL) Chew 2 Gummies Four times a day    docusate sodium  (COLACE) 100 mg Oral Capsule Take 1 Capsule (100 mg total) by mouth Twice per day as needed for Constipation    FERROUS SULFATE ORAL Take 1 Tablet by mouth Daily    Ibuprofen  (MOTRIN ) 600 mg Oral Tablet Take 1 Tablet (600 mg total) by mouth Every 6 hours    MAGNESIUM ORAL Chew 2 Gummies Every evening    prenatal vits62/FA/om3/dha/epa (PRENATAL GUMMY ORAL) Chew 2 Gummies Once a day    UNITHROID  112 mcg Oral Tablet Take 1 tablet (112 mcg) 5 days a week and then Sat and Sun take 2 tablets (224 mcg).     Edema is resolving  14# down from last OB visit  Weighs less than she did at new OB visit    Will not start an antihypertensive today  Pt to call if B/P persistently >150s/100    Discussed worry/anxiety  Offered counseling referral--declined  Encouraged adequate rest; deep breathing exercises  Call if symptoms worsen    Deane Pinal, APRN,MIDWIFE

## 2023-12-20 ENCOUNTER — Ambulatory Visit (HOSPITAL_BASED_OUTPATIENT_CLINIC_OR_DEPARTMENT_OTHER): Payer: Self-pay | Admitting: Family

## 2023-12-21 ENCOUNTER — Ambulatory Visit (HOSPITAL_COMMUNITY): Payer: Self-pay

## 2023-12-21 NOTE — Telephone Encounter (Signed)
 Called for postpartum outreach.     Left VM and sent [facility] message.   Adina Ahle, MSN, RN, Visual merchandiser, [employer]  [phone] (office)

## 2023-12-27 ENCOUNTER — Ambulatory Visit (HOSPITAL_BASED_OUTPATIENT_CLINIC_OR_DEPARTMENT_OTHER): Payer: Self-pay | Admitting: Family

## 2024-01-04 ENCOUNTER — Encounter (HOSPITAL_BASED_OUTPATIENT_CLINIC_OR_DEPARTMENT_OTHER): Payer: Self-pay | Admitting: Obstetrics & Gynecology

## 2024-01-12 ENCOUNTER — Encounter (HOSPITAL_COMMUNITY): Payer: Self-pay

## 2024-01-24 ENCOUNTER — Ambulatory Visit (HOSPITAL_BASED_OUTPATIENT_CLINIC_OR_DEPARTMENT_OTHER): Payer: Self-pay | Admitting: Advanced Practice Midwife

## 2024-01-26 ENCOUNTER — Encounter (HOSPITAL_COMMUNITY): Payer: Self-pay

## 2024-02-13 ENCOUNTER — Encounter (HOSPITAL_BASED_OUTPATIENT_CLINIC_OR_DEPARTMENT_OTHER): Payer: Self-pay | Admitting: INTERNAL MEDICINE-ENDOCRINOLOGY-DIABETES AND METABOLISM

## 2024-02-14 MED ORDER — LEVOTHYROXINE 112 MCG TABLET
ORAL_TABLET | ORAL | 3 refills | Status: AC
Start: 2024-02-14 — End: ?

## 2024-02-14 NOTE — Telephone Encounter (Signed)
 RX approved and encounter closed

## 2024-02-21 ENCOUNTER — Encounter (HOSPITAL_COMMUNITY): Payer: Self-pay

## 2024-02-22 ENCOUNTER — Ambulatory Visit: Payer: Self-pay

## 2024-02-22 ENCOUNTER — Ambulatory Visit (HOSPITAL_BASED_OUTPATIENT_CLINIC_OR_DEPARTMENT_OTHER): Admitting: Family

## 2024-02-22 ENCOUNTER — Other Ambulatory Visit: Payer: Self-pay

## 2024-02-22 ENCOUNTER — Ambulatory Visit: Attending: Family | Admitting: Family

## 2024-02-22 ENCOUNTER — Encounter (HOSPITAL_BASED_OUTPATIENT_CLINIC_OR_DEPARTMENT_OTHER): Payer: Self-pay | Admitting: Family

## 2024-02-22 ENCOUNTER — Ambulatory Visit (HOSPITAL_BASED_OUTPATIENT_CLINIC_OR_DEPARTMENT_OTHER)

## 2024-02-22 VITALS — BP 124/82 | HR 104 | Temp 98.1°F | Ht 66.0 in | Wt 241.4 lb

## 2024-02-22 DIAGNOSIS — E282 Polycystic ovarian syndrome: Secondary | ICD-10-CM | POA: Insufficient documentation

## 2024-02-22 DIAGNOSIS — Z0000 Encounter for general adult medical examination without abnormal findings: Secondary | ICD-10-CM | POA: Insufficient documentation

## 2024-02-22 DIAGNOSIS — Z0001 Encounter for general adult medical examination with abnormal findings: Secondary | ICD-10-CM

## 2024-02-22 LAB — COMPREHENSIVE METABOLIC PNL, FASTING
ALBUMIN: 3.9 g/dL (ref 3.5–5.0)
ALKALINE PHOSPHATASE: 82 U/L (ref 40–110)
ALT (SGPT): 35 U/L — ABNORMAL HIGH (ref <31)
ANION GAP: 7 mmol/L (ref 4–13)
AST (SGOT): 23 U/L (ref 11–34)
BILIRUBIN TOTAL: 0.5 mg/dL (ref 0.3–1.3)
BUN/CREA RATIO: 10 (ref 6–22)
BUN: 7 mg/dL — ABNORMAL LOW (ref 8–25)
CALCIUM: 9.1 mg/dL (ref 8.6–10.2)
CHLORIDE: 103 mmol/L (ref 96–111)
CO2 TOTAL: 27 mmol/L (ref 22–30)
CREATININE: 0.68 mg/dL (ref 0.60–1.05)
GLUCOSE: 108 mg/dL — ABNORMAL HIGH (ref 70–99)
POTASSIUM: 4.4 mmol/L (ref 3.7–5.3)
PROTEIN TOTAL: 6.9 g/dL (ref 6.0–7.9)
SODIUM: 137 mmol/L (ref 136–145)
eGFRcr - FEMALE: >90 mL/min/1.73mˆ2 (ref >=60)

## 2024-02-22 LAB — LIPID PANEL
CHOL/HDL RATIO: 5.4
CHOLESTEROL: 268 mg/dL — ABNORMAL HIGH (ref 100–200)
HDL CHOL: 50 mg/dL (ref >=50)
LDL CALC: 199 mg/dL — ABNORMAL HIGH (ref <100)
NON-HDL: 218 mg/dL — ABNORMAL HIGH (ref <=190)
TRIGLYCERIDES: 108 mg/dL (ref <150)
VLDL CALC: 22 mg/dL (ref <30)

## 2024-02-22 LAB — THYROID STIMULATING HORMONE (SENSITIVE TSH): TSH: 0.365 u[IU]/mL (ref 0.350–4.940)

## 2024-02-22 LAB — HGA1C (HEMOGLOBIN A1C WITH EST AVG GLUCOSE)
ESTIMATED AVERAGE GLUCOSE: 105 mg/dL
HEMOGLOBIN A1C: 5.3 % (ref 4.0–5.6)

## 2024-02-22 NOTE — Progress Notes (Addendum)
 CLINIC HISTORY AND PHYSICAL  FAMILY MEDICINE, [facility]  [address] [place] [address]    [name]  MRN: [id number] DOB: [birth year]  Date of Service: 02/22/2024  PCP: Henri Clarity, APRN,NP-C     This note was created with assistance from [employer] via capture of conversational audio. Consent was obtained from the patient and all parties present prior to recording.      CHIEF COMPLAINT  Chief Complaint   Patient presents with    Annual Wellness Exam     History of Present Illness  [name] is a 33 year old female who presents for a wellness exam.    Thyroid  dysfunction and dysphagia  - Graves disease and hypothyroidism managed with Synthroid  112 micrograms daily  - Occasional dysphagia present  - Thyroid  function tests pending    Obesity and polycystic ovary syndrome (pcos)  - Obesity present  - PCOS diagnosed while attempting conception of second child  - Successful pregnancies since diagnosis    Postpartum status and lactation  - Gave birth to fourth daughter in July 2025 (ages 39, 4, 2, 2 months)  - Currently breastfeeding    Gestational hypertension and rh status  - History of gestational hypertension  - Rh negative  - Statin therapy not prescribed due to a medical contra-indication: pregnant/breastfeeding.     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[employer], limiting physical activity  - Walks three days a week for 20 minutes  - Maintains a mostly healthy diet  - Abstains from alcohol and smoking    Sleep and snoring  - Sleeps 5-7 hours per night  - Snoring present without apneic episodes    Past Medical History  - Graves' disease  - Hypothyroidism  - Obesity  - Polycystic ovary syndrome (PCOS)  - History of gestational hypertension  - Rh negative blood type        Medications  - Vitamin D3  - Fish oil  - Iron   - Synthroid  (112 mcg)  - Magnesium  - Prenatal    Social History  - Tobacco: Denies smoking  - Alcohol:  Denies alcohol use  - Employment: Proofreader for a financial controller through [employer]  - Partner Status: Married  - Living Situation: Lives at home with her children      Family History  - Mother: breast cancer      Physical Exam  VITALS: T- 98.1, P- 104, BP- 124/82  MEASUREMENTS: Height- 5'6, Weight- 241 (26%), BMI- 38.96.  CONSTITUTIONAL: Vitals reviewed, not in acute distress, normal appearance, not toxic appearing.  HEAD EARS NOSE THROAT: Normocephalic and atraumatic, nose normal.  EYES: Conjunctivae normal, pupils are equal, round, and reactive to light.  CARDIOVASCULAR: Normal rate and regular rhythm, normal pulses, normal heart sounds.  PULMONARY: Pulmonary effort is normal, no respiratory distress, normal breath sounds, lungs clear to auscultation bilaterally.  MUSCULOSKELETAL: Normal range of motion, neck supple, no tenderness, no edema in right lower leg, no edema in left lower leg.  SKIN: Skin is warm and dry.  NEUROLOGICAL: No focal deficit present, alert and oriented to person, place, and time, mental status is at baseline.  PSYCHIATRIC: Attention and perception normal, mood and affect normal, speech normal, behavior normal, behavior is cooperative, thought content normal, cognition and memory normal, judgment normal.  NECK: Neck supple, no tenderness.    Assessment and Plan  Recent pregnancy and postpartum state  Delivered in July 2025.  Currently breastfeeding. Gas pain after delivery caused discomfort.   - Monitor lab results for abnormalities.  - Repeat labs at 6 months postpartum if significantly elevated.    Adult Wellness Visit  Routine wellness examination. Labs pending. Discussed self-breast exams, mammogram, colonoscopy, and DEXA scan due to family history of breast cancer.  - Review lab results when available.  - Perform self-breast exams after weaning from breastfeeding.  - Schedule mammogram at age 24 unless earlier if abnormalities are found.  - Schedule colonoscopy at age 54.  - Schedule DEXA  scan at age 29.    Obesity  BMI 38.96. 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  - Encourage walking three days a week for 20 minutes.  - Maintain a mostly healthy diet.    Hypothyroidism status post Graves disease  On Synthroid  112 mcg daily. Due for thyroid  function test.  - Continue Synthroid  112 mcg daily.  - Continue with endocrinology.  - Check thyroid  function test.    Polycystic ovary syndrome (PCOS)  Diagnosed during attempts to conceive second child. Previous scan showed no ovarian cysts.  - Order pelvic ultrasound if insurance covers.  - Discuss findings with OB if needed.    Vitals:    02/22/24 1124   BP: 124/82   Pulse: (!) 104   Temp: 36.7 C (98.1 F)   TempSrc: Thermal Scan   SpO2: 96%   Weight: 110 kg (241 lb 6.5 oz)   Height: 1.676 m (5' 6)   BMI: 38.96     Wt Readings from Last 10 Encounters:   02/22/24 110 kg (241 lb 6.5 oz)   12/15/23 107 kg (236 lb 8.9 oz)   12/08/23 113 kg (249 lb 1.9 oz)   12/06/23 113 kg (250 lb)   12/01/23 113 kg (248 lb 14.4 oz)   11/23/23 114 kg (251 lb 8.7 oz)   11/16/23 114 kg (250 lb 14.1 oz)   11/09/23 113 kg (250 lb 3.6 oz)   11/03/23 115 kg (253 lb 12 oz)   10/27/23 115 kg (252 lb 6.8 oz)       ICD-10-CM    1. Annual physical exam  Z00.00       2. PCOS (polycystic ovarian syndrome)  E28.2 US  PELVIS          Follow up on or around: .      Dr. Henri Clarity, DNP, FNP-C    [facility]  Family Medicine, [facility]  [address]  [place], [place] [address]  [phone]

## 2024-02-25 ENCOUNTER — Encounter (HOSPITAL_BASED_OUTPATIENT_CLINIC_OR_DEPARTMENT_OTHER): Payer: Self-pay | Admitting: Family

## 2024-03-01 ENCOUNTER — Encounter (HOSPITAL_BASED_OUTPATIENT_CLINIC_OR_DEPARTMENT_OTHER): Payer: Self-pay | Admitting: Family

## 2024-03-03 NOTE — Telephone Encounter (Signed)
 Patient inquiring about test results  Lauraine Domino, RN

## 2024-03-13 ENCOUNTER — Ambulatory Visit (HOSPITAL_BASED_OUTPATIENT_CLINIC_OR_DEPARTMENT_OTHER): Payer: Self-pay

## 2024-04-06 ENCOUNTER — Encounter (HOSPITAL_BASED_OUTPATIENT_CLINIC_OR_DEPARTMENT_OTHER): Payer: Self-pay | Admitting: Family

## 2024-06-07 ENCOUNTER — Other Ambulatory Visit: Payer: Self-pay

## 2024-06-22 ENCOUNTER — Encounter (HOSPITAL_BASED_OUTPATIENT_CLINIC_OR_DEPARTMENT_OTHER): Payer: Self-pay | Admitting: Family

## 2024-06-26 ENCOUNTER — Other Ambulatory Visit: Payer: Self-pay

## 2024-06-26 DIAGNOSIS — Z0000 Encounter for general adult medical examination without abnormal findings: Secondary | ICD-10-CM

## 2024-06-26 LAB — THYROID STIMULATING HORMONE (SENSITIVE TSH): TSH: 1.301 u[IU]/mL (ref 0.350–4.940)

## 2025-02-23 ENCOUNTER — Ambulatory Visit (HOSPITAL_BASED_OUTPATIENT_CLINIC_OR_DEPARTMENT_OTHER): Payer: Self-pay | Admitting: Family
